# Patient Record
Sex: Female | Born: 1940
Health system: Southern US, Community
[De-identification: ages and names within clinical notes are randomized; demographics above are authoritative.]

## PROBLEM LIST (undated history)

## (undated) DIAGNOSIS — M1990 Unspecified osteoarthritis, unspecified site: Secondary | ICD-10-CM

## (undated) DIAGNOSIS — I10 Essential (primary) hypertension: Secondary | ICD-10-CM

## (undated) DIAGNOSIS — E039 Hypothyroidism, unspecified: Secondary | ICD-10-CM

## (undated) DIAGNOSIS — D649 Anemia, unspecified: Secondary | ICD-10-CM

## (undated) DIAGNOSIS — G4700 Insomnia, unspecified: Secondary | ICD-10-CM

## (undated) DIAGNOSIS — R739 Hyperglycemia, unspecified: Secondary | ICD-10-CM

## (undated) DIAGNOSIS — Z0000 Encounter for general adult medical examination without abnormal findings: Principal | ICD-10-CM

## (undated) DIAGNOSIS — R351 Nocturia: Secondary | ICD-10-CM

## (undated) DIAGNOSIS — K219 Gastro-esophageal reflux disease without esophagitis: Secondary | ICD-10-CM

## (undated) DIAGNOSIS — I4719 Other supraventricular tachycardia: Secondary | ICD-10-CM

## (undated) DIAGNOSIS — I471 Supraventricular tachycardia: Secondary | ICD-10-CM

## (undated) DIAGNOSIS — M810 Age-related osteoporosis without current pathological fracture: Secondary | ICD-10-CM

## (undated) DIAGNOSIS — I480 Paroxysmal atrial fibrillation: Secondary | ICD-10-CM

## (undated) DIAGNOSIS — I2510 Atherosclerotic heart disease of native coronary artery without angina pectoris: Secondary | ICD-10-CM

## (undated) DIAGNOSIS — T7840XA Allergy, unspecified, initial encounter: Secondary | ICD-10-CM

## (undated) DIAGNOSIS — E785 Hyperlipidemia, unspecified: Secondary | ICD-10-CM

## (undated) DIAGNOSIS — J300 Vasomotor rhinitis: Secondary | ICD-10-CM

## (undated) DIAGNOSIS — I255 Ischemic cardiomyopathy: Secondary | ICD-10-CM

## (undated) DIAGNOSIS — E669 Obesity, unspecified: Secondary | ICD-10-CM

## (undated) DIAGNOSIS — R7303 Prediabetes: Secondary | ICD-10-CM

## (undated) HISTORY — DX: Encounter for general adult medical examination without abnormal findings: Z00.00

## (undated) HISTORY — DX: Hyperlipidemia, unspecified: E78.5

## (undated) HISTORY — DX: Nocturia: R35.1

## (undated) HISTORY — PX: BREAST SURGERY: SHX581

## (undated) HISTORY — DX: Vasomotor rhinitis: J30.0

## (undated) HISTORY — PX: COLONOSCOPY: SHX174

## (undated) HISTORY — PX: INGUINAL HERNIA REPAIR: SUR1180

## (undated) HISTORY — DX: Hyperglycemia, unspecified: R73.9

## (undated) HISTORY — DX: Insomnia, unspecified: G47.00

## (undated) HISTORY — DX: Allergy, unspecified, initial encounter: T78.40XA

## (undated) HISTORY — PX: EYE SURGERY: SHX253

## (undated) HISTORY — PX: UMBILICAL HERNIA REPAIR: SHX196

## (undated) HISTORY — DX: Anemia, unspecified: D64.9

## (undated) HISTORY — DX: Obesity, unspecified: E66.9

## (undated) HISTORY — PX: CATARACT EXTRACTION: SUR2

---

## 2002-04-10 ENCOUNTER — Encounter: Admission: RE | Admit: 2002-04-10 | Discharge: 2002-04-10 | Payer: Self-pay | Admitting: Internal Medicine

## 2002-04-10 ENCOUNTER — Encounter: Payer: Self-pay | Admitting: Internal Medicine

## 2004-10-23 HISTORY — PX: TOTAL KNEE ARTHROPLASTY: SHX125

## 2004-11-28 ENCOUNTER — Inpatient Hospital Stay (HOSPITAL_COMMUNITY): Admission: RE | Admit: 2004-11-28 | Discharge: 2004-12-02 | Payer: Self-pay | Admitting: Orthopedic Surgery

## 2004-11-28 ENCOUNTER — Ambulatory Visit: Payer: Self-pay | Admitting: Physical Medicine & Rehabilitation

## 2007-05-06 ENCOUNTER — Encounter: Admission: RE | Admit: 2007-05-06 | Discharge: 2007-05-06 | Payer: Self-pay | Admitting: Internal Medicine

## 2007-05-11 ENCOUNTER — Encounter: Admission: RE | Admit: 2007-05-11 | Discharge: 2007-05-11 | Payer: Self-pay | Admitting: Internal Medicine

## 2009-10-23 HISTORY — PX: TOTAL KNEE ARTHROPLASTY: SHX125

## 2010-08-18 ENCOUNTER — Inpatient Hospital Stay (HOSPITAL_COMMUNITY): Admission: RE | Admit: 2010-08-18 | Discharge: 2010-08-20 | Payer: Self-pay | Admitting: Orthopedic Surgery

## 2011-01-04 LAB — BASIC METABOLIC PANEL
BUN: 11 mg/dL (ref 6–23)
BUN: 8 mg/dL (ref 6–23)
Calcium: 8.6 mg/dL (ref 8.4–10.5)
Chloride: 108 mEq/L (ref 96–112)
Creatinine, Ser: 0.91 mg/dL (ref 0.4–1.2)
Creatinine, Ser: 0.94 mg/dL (ref 0.4–1.2)
GFR calc Af Amer: >60 mL/min (ref >60)
GFR calc Af Amer: >60 mL/min (ref >60)
GFR calc non Af Amer: 59 mL/min — ABNORMAL LOW (ref >60)
Potassium: 4.2 mEq/L (ref 3.5–5.1)

## 2011-01-04 LAB — DIFFERENTIAL
Eosinophils Absolute: 0.1 10*3/uL (ref 0.0–0.7)
Eosinophils Relative: 3 % (ref 0–5)
Lymphocytes Relative: 25 % (ref 12–46)
Lymphs Abs: 1.1 10*3/uL (ref 0.7–4.0)
Monocytes Absolute: 0.4 10*3/uL (ref 0.1–1.0)
Monocytes Relative: 10 % (ref 3–12)

## 2011-01-04 LAB — CBC
HCT: 27.6 % — ABNORMAL LOW (ref 36.0–46.0)
Hemoglobin: 12.3 g/dL (ref 12.0–15.0)
MCH: 32 pg (ref 26.0–34.0)
MCHC: 33.8 g/dL (ref 30.0–36.0)
MCV: 93.9 fL (ref 78.0–100.0)
Platelets: 198 10*3/uL (ref 150–400)
Platelets: 209 10*3/uL (ref 150–400)
Platelets: 211 10*3/uL (ref 150–400)
RBC: 2.92 MIL/uL — ABNORMAL LOW (ref 3.87–5.11)
RBC: 3.84 MIL/uL — ABNORMAL LOW (ref 3.87–5.11)
RDW: 12.8 % (ref 11.5–15.5)
RDW: 12.9 % (ref 11.5–15.5)
WBC: 7.9 10*3/uL (ref 4.0–10.5)
WBC: 8 10*3/uL (ref 4.0–10.5)

## 2011-01-04 LAB — TYPE AND SCREEN
ABO/RH(D): A POS
Antibody Screen: NEGATIVE

## 2011-01-04 LAB — URINALYSIS, ROUTINE W REFLEX MICROSCOPIC
Bilirubin Urine: NEGATIVE
Ketones, ur: NEGATIVE mg/dL
Nitrite: NEGATIVE
Specific Gravity, Urine: 1.025 (ref 1.005–1.030)
Urobilinogen, UA: 0.2 mg/dL (ref 0.0–1.0)
pH: 5.5 (ref 5.0–8.0)

## 2011-01-04 LAB — COMPREHENSIVE METABOLIC PANEL
ALT: 20 U/L (ref 0–35)
AST: 26 U/L (ref 0–37)
Albumin: 3.8 g/dL (ref 3.5–5.2)
CO2: 27 mEq/L (ref 19–32)
Calcium: 9.3 mg/dL (ref 8.4–10.5)
Creatinine, Ser: 0.92 mg/dL (ref 0.4–1.2)
GFR calc Af Amer: >60 mL/min (ref >60)
Sodium: 141 mEq/L (ref 135–145)
Total Protein: 7.1 g/dL (ref 6.0–8.3)

## 2011-01-04 LAB — ABO/RH: ABO/RH(D): A POS

## 2011-01-04 LAB — PROTIME-INR: Prothrombin Time: 13.2 seconds (ref 11.6–15.2)

## 2011-01-04 LAB — SURGICAL PCR SCREEN: MRSA, PCR: NEGATIVE

## 2011-03-10 NOTE — Op Note (Signed)
NAME:  Wendy Munoz, Wendy Munoz NO.:  [id number]   MEDICAL RECORD NO.:  [id number]          PATIENT TYPE:  INP   LOCATION:  0003                         FACILITY:  [facility]   PHYSICIAN:  John L. Rendall III, M.D.DATE OF BIRTH:  08/20/1941   DATE OF PROCEDURE:  11/28/2004  DATE OF DISCHARGE:                                 OPERATIVE REPORT   INDICATION AND JUSTIFICATION FOR PROCEDURE:  Chronic osteoarthritis right  knee with constant pain.   JUSTIFICATION FOR IN-HOUSE STAY:  Pain control and monitoring.   PREOPERATIVE DIAGNOSIS:  End-stage osteoarthritis right knee.   SURGICAL PROCEDURE:  Right LCS total knee replacement with computer  navigation assistance.   POSTOPERATIVE DIAGNOSIS:  End-stage osteoarthritis right knee.   SURGEON:  John L. Rendall, M.D.   ASSISTANT:  Richardean Canal, P.A.-C.   PATHOLOGY:  The patient has bone-against-bone medial compartment right knee  and has had chronic unremitting pain.  There is a 14-degree flexion  contracture and 2 degrees varus at the start of the procedure from anatomic  axis.   PROCEDURE:  Under general anesthesia, the right leg was prepared with  DuraPrep and draped as a sterile field.  The sterile tourniquet was applied,  and the leg was wrapped out with the Esmarch, and the tourniquet was used to  350 mmHg.  Midline incision was made.  The patella was everted.  Multiple  small vessels were seen, and some were cauterized at the end of the case,  but the femur was sized at a standard.  Then, using insertion of Schanz pins  on the medial tibia through extra little punctures and on the medial femoral  condyle, computer navigation was initiated.  Femoral head was found first,  medial and lateral malleoli second.  The computer was then used to map the  proximal tibia and the distal femur.  Once this was done and accuracy was  checked to be within 1 mm, attention was then turned to going ahead with  osteotomies.  The proximal  tibia was resected first and was within 1 degree  of anatomical axis when completed.  The lamina spreader tensioner device was  then inserted, and the extension gap releases were then done to get the knee  within anatomic axis within a degree or a degree and a half once this was  completed.  The femoral cuts were made using the computer navigation assist.  Anterior and posterior flare of the distal femur were done first, followed  by distal femoral cut, followed by recessing guide.  Lamina spreader was  then inserted, and remnants of the menisci and cruciates were resected, and  spurs taken off the back of the femoral condyles.  Once this was completed,  the tibia was sized to a 2.5.  Center peg hole with fins was applied.  The  trial reduction of a 2.5 tibia, 12.5 bearing, and standard femur revealed  some hyperextension, and this was corrected with going to a 15 bearing.  The  patella was osteotomized, three peg holes inserted, closing the retinaculum  and capsule with towel clips.  Excellent  fit, alignment, and stability  within 2 degrees of anatomic axis were found as the knee went through  flexion and extension with trial components in place.  At this point, the  Schanz pins were removed.  Bony surfaces were prepared with pulse  irrigation, and permanent components were cemented in place.  The  tourniquet was let down at approximately 1 hour 15 minutes.  Multiple small  vessel cauterized.  Medium Hemovac inserted.  The knee was then closed in  layers with #1 Tycron, 0 and 2-0 Vicryl, and skin clips.  Operative time 1  hour 45 minutes.  The patient tolerated the procedure well and returned to  recovery in good condition.      JLR/MEDQ  D:  11/28/2004  T:  11/28/2004  Job:  [id number]

## 2011-03-10 NOTE — Discharge Summary (Signed)
NAME:  Wendy Munoz, Wendy Munoz NO.:  [id number]   MEDICAL RECORD NO.:  [id number]          PATIENT TYPE:  INP   LOCATION:  0465                         FACILITY:  [facility]   PHYSICIAN:  John L. Rendall, M.D.  DATE OF BIRTH:  [birth year]   DATE OF ADMISSION:  11/28/2004  DATE OF DISCHARGE:                                 DISCHARGE SUMMARY   ADMITTING DIAGNOSES:  1.  Bilateral osteoarthritis with right knee pain greater than left.  2.  Hypothyroidism.  3.  Allergic rhinitis.  4.  Hyperlipidemia.  5.  Rosacea.  6.  Eczema.  7.  Degenerative changes bilateral hands.  8.  Occasional gastric reflux.   DISCHARGE DIAGNOSES:  1.  Status post right total knee arthroplasty.  2.  Left knee osteoarthritis.  3.  Acute blood loss anemia secondary to surgery requiring no blood      transfusions.  4.  Slow progress with physical therapy.  5.  Hypothyroidism.  6.  Allergic rhinitis.  7.  Hyperlipidemia.  8.  Rosacea.  9.  Eczema.  10. Degenerative changes bilateral hands.  11. Occasional gastric reflux.   HISTORY OF PRESENT ILLNESS:  Wendy Munoz is a 69 year old white female with  bilateral knee pain for approximately 5 years.  Presently, right knee pain  greater than left.  The patient has received cortisone injections in  bilateral knees October 06, 2004.  This gave her total relief from pain  until November 06, 2004.  No known injury to either knee.  Right knee pain  described as moderate to severe constant, aching pain made worse with  ambulation.  No waking pain.  Mechanical symptoms positive for locking.  The  patient uses no assistive devices for ambulation.  X-rays of the right knee  showed bone-on-bone of the medial compartment.   ALLERGIES:  No known drug allergies.  SHELLFISH causes nausea and vomiting.  She also has allergies to GRASS, POLLEN, and MILDEW.   MEDICATIONS:  1.  Synthroid 0.1 mg Monday, Wednesday, Friday.  Synthroid 0.8 mg Tuesday,      Thursday,  Saturday, and Sunday.  2.  Zyrtec 10 mg one p.o. daily.  3.  Lipitor 20 mg one p.o. daily.  4.  Black cohosh 40 mg two tablets daily.  5.  Calcium p.r.n.  6.  Ibuprofen p.r.n.  7.  Tums p.r.n.  8.  MetroLotion p.r.n.  9.  Elidel p.r.n.   SURGICAL PROCEDURE:  The patient was taken to the operating room on November 28, 2004, by Dr. Erasmo Leventhal, assisted by Richardean Canal, P.A.-C.  The  patient was placed under general anesthesia and a right total knee  arthroplasty was performed.  The following components were placed:  Standard  femoral component, standard three-peg patella, 15 mm bearing, and a 2.5 keel-  tip tibial tray.  The patient tolerated the procedure well, returned to  recovery in good stable condition.   CONSULTS:  Case management, PT/OT, rehab.   HOSPITAL COURSE:  Postoperative day #1, the patient developed acute blood  loss anemia secondary to surgery.  Hemoglobin was 9.6 and  28.0.  The  patient, however, was asymptomatic.  She also had some nausea and felt  tired.  The patient's PCA therefore was to be stopped at lunch if she  tolerated lunch well and OxyContin 10 mg extended release one q.12h. was  begun.   Postoperative day #2, the patient progressing slowing with physical therapy.  Therefore, rehab consult was obtained.  H&H stable at 9.2 and 26.8.  Otherwise, the patient was afebrile, vital signs were stable.   Postoperative day #3, the patient having dizziness, dry mouth, felt was due  to pain medications.  OxyContin was therefore stopped later during the day.  The patient's H&H were 8.6 and 25.1.  The patient remained asymptomatic.  The patient was still progressing slowly with physical therapy; however, was  able to walk 40 feet with a walker at that time and therefore was not a  candidate for rehab.   Postoperative day #4, the patient's H&H 9.6 and 28.2.  The patient otherwise  with no complaints, afebrile, vital signs stable.  Skilled nursing facility  bed  was obtained and the patient was to be discharged late in the afternoon  to the [facility] in [place], [place].   LABORATORY DATA:  Routine labs on admission:  CBC with white count 4400,  hemoglobin 12.0, hematocrit 35.8, platelets 305.  At time of discharge,  hemoglobin was 9.6 and 28.2.   Routine coags on admission:  All values within normal limits.  Routine  chemistries on admission:  Glucose slightly elevated at 113; otherwise,  within normal limits.   Hepatic enzymes on admission:  All values within normal limits.  UA on  admission was negative.   Chest x-ray performed on November 23, 2004 showed no active disease.   EKG performed on November 23, 2004 showed normal sinus rhythm with occasional  PVC.  Heart rate 75 beats per minute, PR interval 136 milliseconds, PRT axis  47/43/45.   DISCHARGE INSTRUCTIONS:   MEDICATIONS:  1.  Arixtra 2.5 mg one injection subcu daily at 6 a.m. - first dose on      November 29, 2004.  The patient will undergo a total of 10 doses.      Therefore, last dose should be on December 09, 2004.  2.  Levothyroxine 100 mcg Monday, Wednesday, and Friday.  Levothyroxine 88      mcg on Tuesday, Thursday, Saturday, Sunday.  3.  Claritin 10 mg one p.o. daily.  4.  Zocor 40 mg one p.o. daily.  5.  Protonix 40 mg one p.o. daily.  6.  Colace 100 mg one p.o. b.i.d.  7.  Trinsicon one capsule p.o. three times a day.  8.  Enema of choice p.r.n.  9.  Oxycodone 5/325 mg one p.o. q.4-6h. p.r.n. pain; Tylenol not to exceed      40 00 mg daily.  10. Robaxin 500 mg one p.o. q.6h. p.r.n. spasm.  11. Restoril 15 mg p.o. q.h.s. p.r.n.  12. Reglan 10 mg tablet one p.o. q.8h. p.r.n.   DIET:  The patient is regular diet, advance as tolerated.   CONDITION ON DISCHARGE:  The patient is discharged to [facility] care in  good stable condition.   ACTIVITY:  Weightbearing as tolerated on the right leg with walker.  FOLLOW-UP:  The patient will be followed up in  the office with Dr. Priscille Kluver  approximately 14 days from surgery.  At that time, staples will be removed  and x-rays performed.   SPECIAL CARE:  CPM  0-90 degrees 6-8 hours a day and increase by 10 degrees  daily.   WOUND CARE:  The patient is to keep wound clean and dry, change dressing  daily.  Call office at [phone] if temperature is greater than 101.5, any  signs of infection, or if pain is not controlled with pain medications.      GC/MEDQ  D:  12/02/2004  T:  12/02/2004  Job:  [id number]

## 2011-03-10 NOTE — H&P (Signed)
NAME:  Wendy Munoz, KAGE NO.:  [id number]   MEDICAL RECORD NO.:  [id number]          PATIENT TYPE:  INP   LOCATION:  NA                           FACILITY:  [facility]   PHYSICIAN:  John L. Rendall, M.D.  DATE OF BIRTH:  [birth year]   DATE OF ADMISSION:  11/28/2004  DATE OF DISCHARGE:                                HISTORY & PHYSICAL   CHIEF COMPLAINT:  Bilateral knee pain, right greater than left.   HISTORY:  Ms. Wendy Munoz is a 70 year old white female with bilateral knee pain  x5 years.  Apparently, right knee pain greater than left.  The patient has  received Cortisone injections in both knees on October 06, 2004, which gave  her total relief from pain until approximately November 06, 2004.  No known  injury to either knee.  Right knee pain described as moderate to severe, a  constant ache pain, made worse with ambulation.  There is no waking pain.  Mechanical symptoms positive for locking, no assistive devices.  X-rays of  the right knee show bone-on-bone medial compartment.  The patient is to  undergo right total knee arthroplasty on November 28, 2004, at [facility] using computer navigation.   ALLERGIES:  1.  She does have an allergy to [facility].  This causes nausea and vomiting.  2.  GRASS.  3.  POLLEN.  4.  MILDEW.   MEDICATIONS:  1.  Synthroid 0.1 mcg on Monday, Wednesday, and Friday.  2.  Synthroid 0.1 mcg on Tuesday, Thursday, Saturday, and Sunday.  3.  Zyrtec 10 mg one daily.  4.  Lipitor 20 mg one p.o. daily.  5.  Black cohosh 40 mg two daily.  6.  Calcium p.r.n.  7.  Metro lotion p.r.n.  8.  Fish oil p.r.n.  9.  Elavil p.r.n.  10. Ibuprofen p.r.n.  11. Tums p.r.n.   PAST MEDICAL HISTORY:  1.  Hypothyroidism.  2.  Allergic rhinitis.  3.  Hyperlipidemia.  4.  Rosacea.  5.  Face edema.   PAST SURGICAL HISTORY:  1.  Umbilical hernia repair.  2.  Tonsillectomy.  3.  Inguinal hernia repair.  4.  Surgical biopsy of left breast.  5.   Left knee arthroscopy.  The patient denies any complications or any blood products being given with  any of the above.   PROCEDURES:  She denies any blood transfusion.   SOCIAL HISTORY:  The patient denies any tobacco or alcohol use.  She is  married.  She has one adult child.  Primary care physician is Dr. Burton Apley, phone number is [phone].  The patient lives in a one story home  with no steps to the usual entrance.  She now has a ramp at the usual  entrance.  She is currently employed in a clerical position.   FAMILY HISTORY:  Mother deceased at age 19, history of stroke, hypertension,  probable heart attack.  Father deceased at age 19, polycythaemica vera and  multiple myeloma.  She has one brother who has arthritis.  Sister who is age  24.  REVIEW OF SYSTEMS:  The patient had a recent cough/cold, however, this  resolved.  She has jaw pain, locking sensation on the left.  She wears  glasses.  Has shortness of breath with exertion, feels that this is due to  deconditioning.  She feels that she could walk a block without getting short  of breath.  No PND, orthopnea.  Occasional reflux.  Nocturia two to three  times in the night.  History of right shoulder pain.  Otherwise, review of  systems is negative.  The patient does not have a living will or power of  attorney.   PHYSICAL EXAMINATION:  GENERAL:  The patient is a well-developed, well-  nourished overweight female.  The patient walks with an antalgic gait.  The  patient's mood and affect are appropriate.  She talks easily with examiner.  VITAL SIGNS:  The patient's height is 5 feet 3 inches, weight is 215 pounds.  Temperature is 97 degrees Fahrenheit, blood pressure is 140/80, respiratory  rate is 16, pulse is 78.  HEENT:  Head is normocephalic, atraumatic without frontal or maxillary sinus  tenderness to palpation.  Sclerae is nonicteric bilaterally, conjunctivae  are pink and moist.  EOM's are intact.  PERRLA.  Nose  and nasal septum  midline.  Nasal mucosa is pink and moist without clots.  No external ear  deformities.  Left TM is occluded by cerumen, right TM pearly gray.  Buccal  mucosa is pink and moist.  Tongue and uvula are midline.  Pharynx is without  erythema or exudate.  NECK:  No lymphadenopathy.  Carotids are 2+ bilaterally without bruits.  She  has good range of motion of the cervical spine without pain.  CARDIAC:  Regular rate and rhythm, no murmurs, rubs, or gallops noted.  LUNGS:  Clear to auscultation bilaterally.  No wheezes, rhonchi, or rales  noted.  ABDOMEN:  Soft, nontender, bowel sounds x4 quadrants.  BACK:  Nontender over the thoracic and lumbar spine with palpation.  BREASTS:  Deferred at this time.  GENITOURINARY:  Deferred at this time.  RECTAL:  Deferred at this time.  NEUROLOGIC:  The patient is alert and oriented x3.  Cranial nerves II-XII  intact.  Deep tendon reflexes are 2+ at the knees and 1+ at the ankles  bilaterally.  MUSCULOSKELETAL:  Upper extremities are equal and symmetric in size and  shape.  She has full range of motion of the shoulders, elbows, and wrists.  She has degenerative changes of the hands bilaterally.  Radial pulses are 2+  bilaterally.  Lower extremities:  The patient has full range of motion of  both hips without pain.  She flexes to 90 degrees without pain.  Left knee 0  to 110 degrees of flexion.  Mild crepitance with passive range of motion.  She has pain with palpation of the medial compartment.  Valgus-varus  stressing is negative for laxity.  Anterior drawer is negative.  No  effusion, no edema.  There is a significant Baker's cyst behind the left  knee.  Right knee:  The patient lacks approximately two to three degrees in  full extension, flexes to 112 degrees.  She has moderate crepitance with  passive range of motion.  Palpation on the joint line reveals medial  compartment tenderness.  There is no edema, effusion.   Valgus-varus stressing feels unlikely.  Anterior drawer is negative.  Lower extremities  are non-edematous.  Dorsal and pedal pulse on the left is 1+, right is 3+.  The patient has good sensation to light touch in the toes.   LABORATORY DATA:  X-rays of the right knee reveal bone-on-bone of the medial  compartment.  Left knee x-rays show near bone-on-bone of the medial  compartment with approximately 1/15 inch of a separation.   IMPRESSION:  1.  Bilateral osteoarthritis with right knee greater than left.  2.  Hypothyroidism.  3.  Allergic rhinitis.  4.  Hyperlipidemia.  5.  Rosacea.  6.  Eczema.  7.  Degenerative changes of bilateral hands.  8.  Occasional gastric reflux.   PLAN:  The patient is to be admitted to [facility] on November 28, 2004, to undergo a right total knee replacement.  The patient will undergo  all preoperative labs and testing prior to surgery.  The patient did receive  a preoperative clearance from Dr. Burton Apley.      GC/MEDQ  D:  11/17/2004  T:  11/17/2004  Job:  [id number]

## 2012-01-01 IMAGING — CR DG CHEST 2V
2 series · 2 of 2 positions shown · non-contrast
Comparison: 11/23/2004

CLINICAL DATA: Preoperative respiratory exam.  Osteoarthritis of
the left knee.

CHEST - 2 VIEW

[w chest pa]
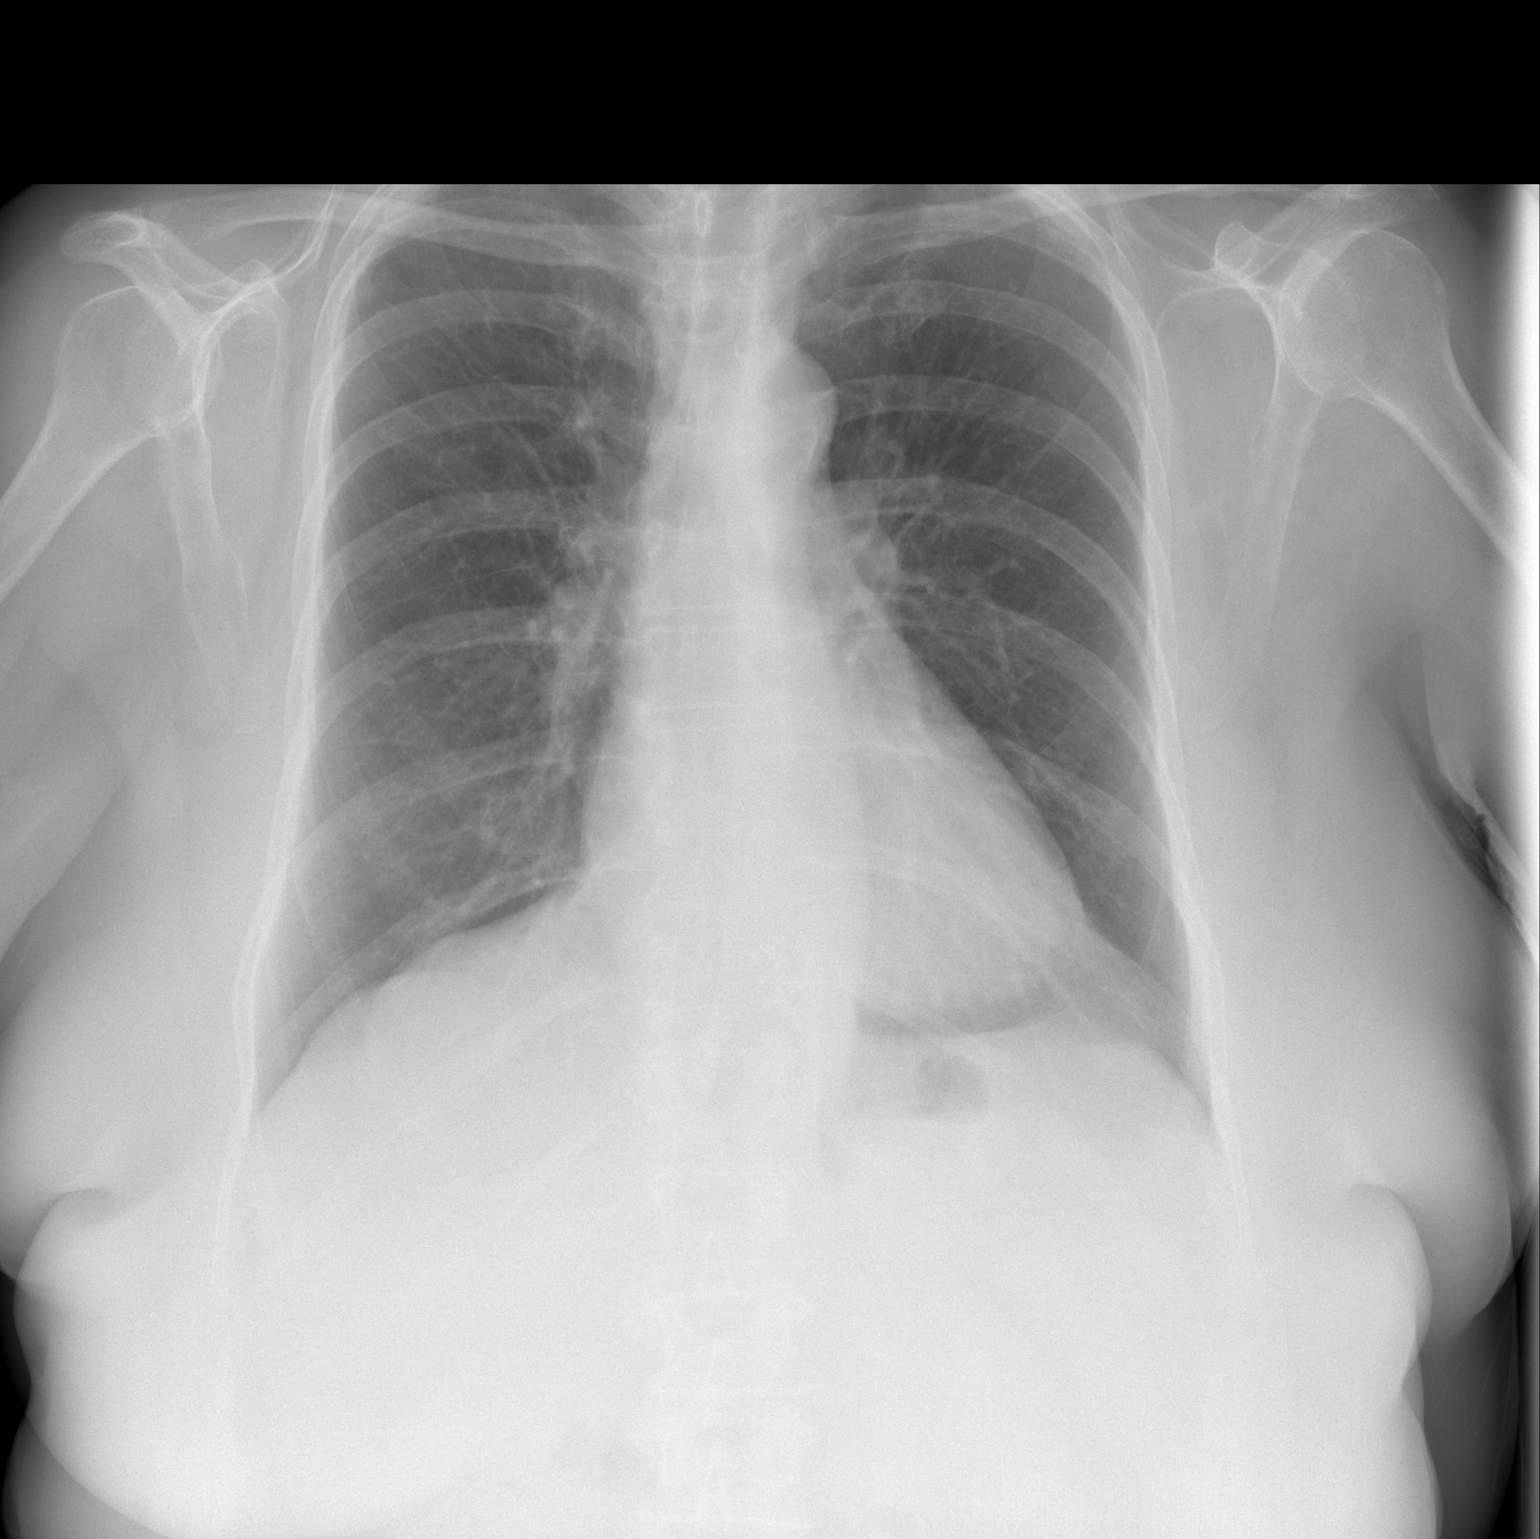

[w chest lat]
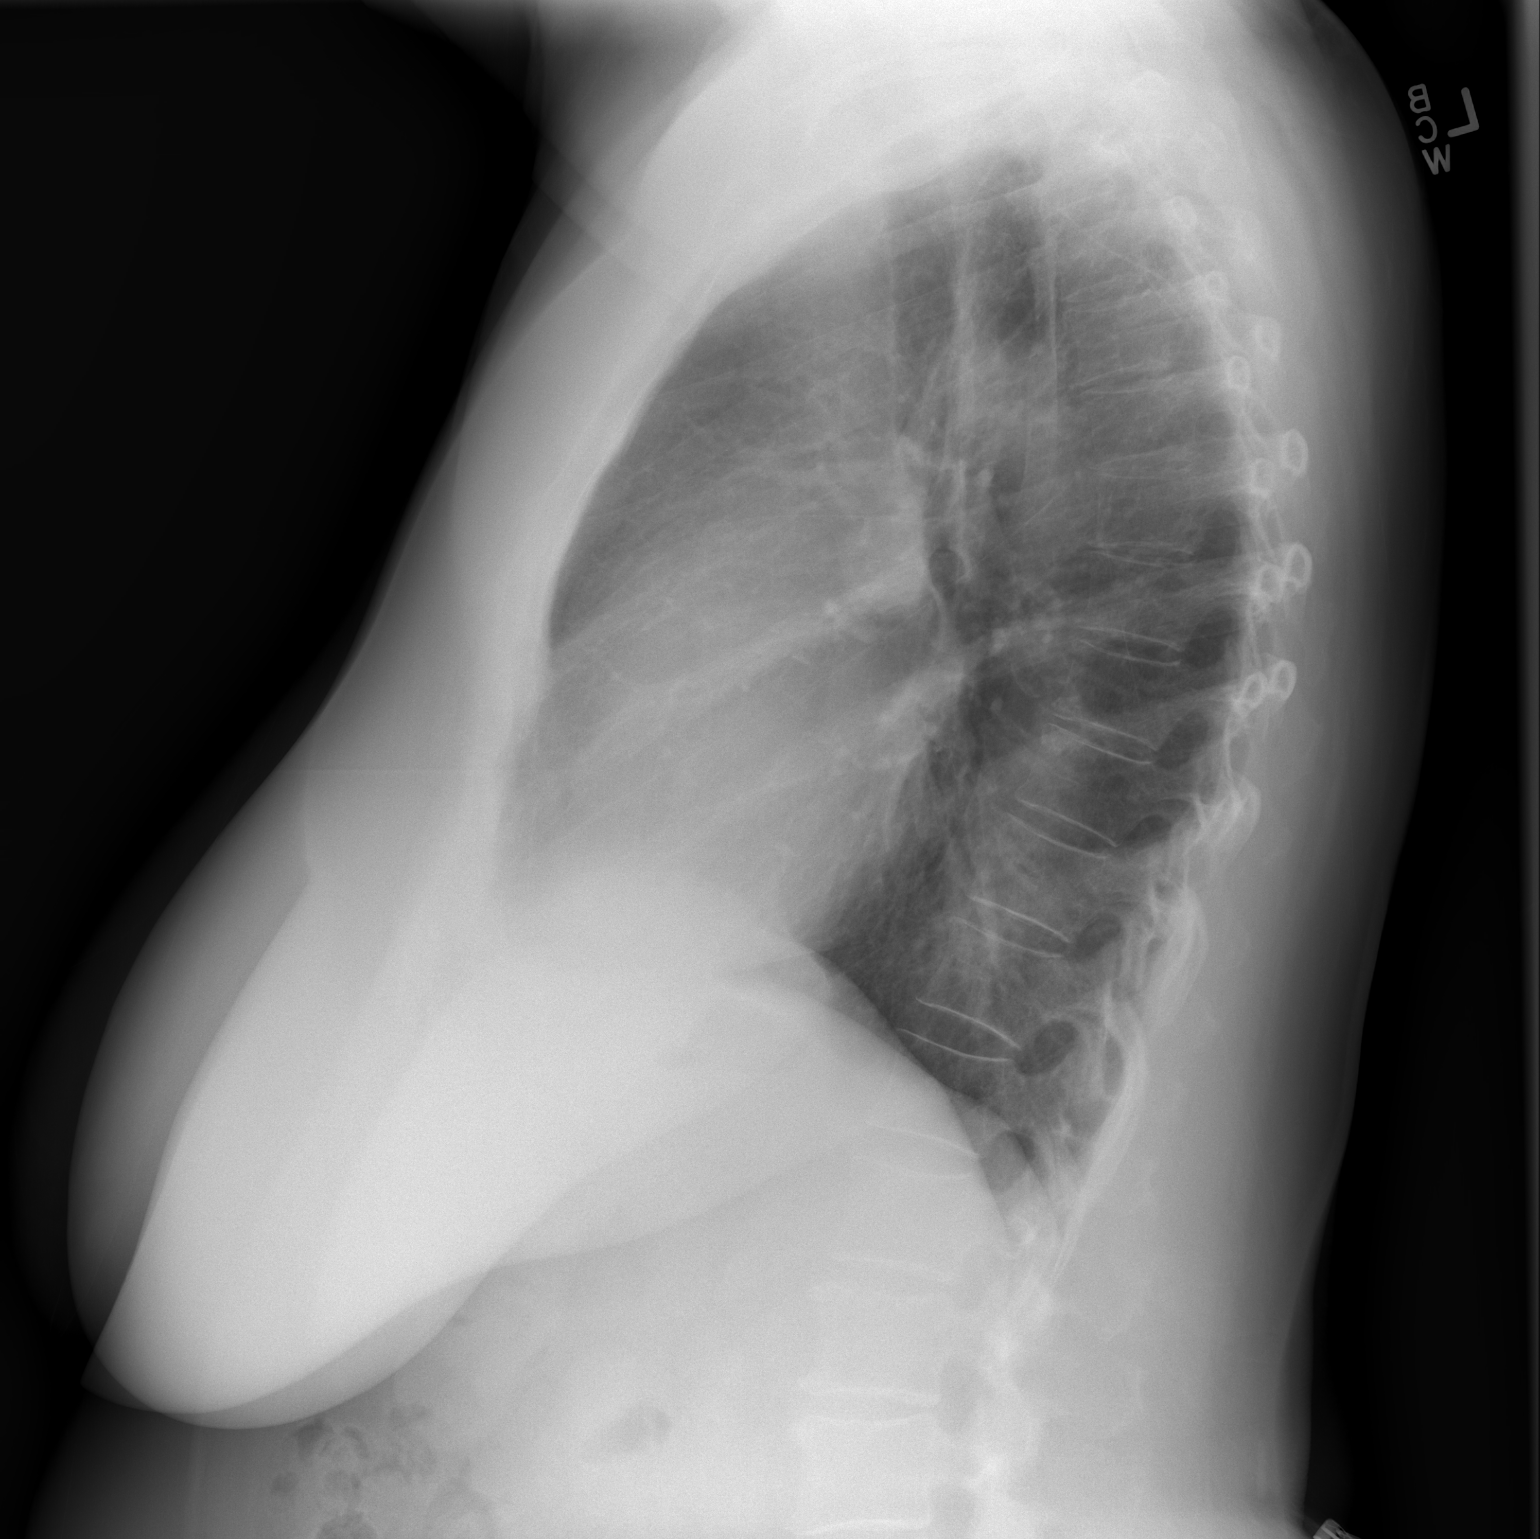

[2 of 2 positions shown; findings below may reference images not displayed]

FINDINGS: The heart size and vascularity are normal and the lungs
are clear.  No significant osseous abnormality.
IMPRESSION: Normal chest.

## 2012-03-07 LAB — HM COLONOSCOPY

## 2013-04-03 DIAGNOSIS — Z96659 Presence of unspecified artificial knee joint: Secondary | ICD-10-CM | POA: Insufficient documentation

## 2013-04-03 HISTORY — DX: Presence of unspecified artificial knee joint: Z96.659

## 2014-01-26 ENCOUNTER — Encounter (HOSPITAL_COMMUNITY): Payer: Self-pay | Admitting: Orthopedic Surgery

## 2014-01-26 ENCOUNTER — Encounter (HOSPITAL_COMMUNITY): Payer: Self-pay

## 2014-01-26 NOTE — Progress Notes (Signed)
01/26/14 1635  OBSTRUCTIVE SLEEP APNEA  Have you ever been diagnosed with sleep apnea through a sleep study? No  Do you snore loudly (loud enough to be heard through closed doors)?  0  Do you often feel tired, fatigued, or sleepy during the daytime? 1  Has anyone observed you stop breathing during your sleep? 0  Do you have, or are you being treated for high blood pressure? 1  BMI more than 35 kg/m2? 1  Age over 73 years old? 1  Gender: 0  Obstructive Sleep Apnea Score 4  Score 4 or greater  Results sent to PCP

## 2014-01-26 NOTE — H&P (Signed)
[facility] H&P  Chief Complaint: R proximal humerus fx HPI:   73 y/o RHD female s/p fall at the end of march with resultant R proximal humerus fx. Attempted non-op management but this was unsuccessful.  Pt seen in office on 01/26/2014. Agreeable to surgery. Presents for ORIF of R proximal humerus   Past Medical History  Diagnosis Date  . Hypothyroidism   . Arthritis   . Prediabetes   . HTN (hypertension)   . Osteoporosis     Past Surgical History  Procedure Laterality Date  . Inguinal hernia repair    . Umbilical hernia repair    . Total knee arthroplasty Right 2006  . Total knee arthroplasty Left 2011  . Cataract extraction Bilateral     Family History  Problem Relation Age of Onset  . Breast cancer    . CAD Mother    Social History:  reports that she has never smoked. She does not have any smokeless tobacco history on file. She reports that she does not drink alcohol. Her drug history is not on file.  Allergies:  Allergies  Allergen Reactions  . Keflex [Cephalexin] Rash   Meds PTA  ASA (81 mg), calcium/mag/vitaminD, zyrtec, cholecalciferol, norco, synthroid, lisinopril     Labs: pending (same day labs)   Review of Systems  Constitutional: Negative for fever and chills.  Respiratory: Negative for shortness of breath and wheezing.   Cardiovascular: Negative for chest pain and palpitations.  Gastrointestinal: Negative for nausea, vomiting and abdominal pain.  Musculoskeletal:       R shoulder pain   Neurological: Negative for tingling and sensory change.    Physical Exam  AF VSS in office   Physical Exam  Constitutional: She is oriented to person, place, and time. She appears well-developed and well-nourished.  HENT:  Head: Normocephalic.  Cardiovascular: Normal rate and regular rhythm.   Respiratory: Effort normal and breath sounds normal.  GI: Soft. Bowel sounds are normal.  Musculoskeletal:  Right Upper Extremity     + swelling and  ecchymosis to R UEx    Ax sensation intact    Distal R/U/M sensation intact    Weak supination     R/U/M motor grossly intact otherwise     Ext warm    + Radial pulse   Neurological: She is alert and oriented to person, place, and time.  Skin: Skin is warm.  Psychiatric: She has a normal mood and affect. Her behavior is normal.     xrays      2 part R proximal humerus fx    Assessment/Plan  73 y/o RHD female s/p fall with R proximal humerus fx, failed conservative tx  OR for ORIF R proximal humerus Admit after surgery for observation and pain control NWB/no lifting x 6-8 weeks Pt will have ROM restrictions following surgery- pendulums until 2 weeks post op, then PROM R shoulder until week 4, AAROM weeks 4-6, AROM after 6 weeks and resistance activities incorporated around the 8 week mark  Pt will not require pharmacologic dvt/pe prophylaxis as this is an upper extremity procedure and the pt will be ambulatory post op    Jari Pigg, PA-C [facility] [phone] (P)  01/26/2014, 2:35 PM

## 2014-01-27 ENCOUNTER — Encounter (HOSPITAL_COMMUNITY)
Admission: RE | Disposition: A | Discharge status: Home / Self Care | Payer: Self-pay | Source: Ambulatory Visit | Attending: Orthopedic Surgery

## 2014-01-27 ENCOUNTER — Ambulatory Visit (HOSPITAL_COMMUNITY): Payer: Medicare Other

## 2014-01-27 ENCOUNTER — Observation Stay (HOSPITAL_COMMUNITY)
Admission: RE | Admit: 2014-01-27 | Discharge: 2014-01-28 | Disposition: A | Discharge status: Home / Self Care | Payer: Medicare Other | Source: Ambulatory Visit | Attending: Orthopedic Surgery | Admitting: Orthopedic Surgery

## 2014-01-27 ENCOUNTER — Encounter (HOSPITAL_COMMUNITY): Payer: Medicare Other | Admitting: Anesthesiology

## 2014-01-27 ENCOUNTER — Encounter (HOSPITAL_COMMUNITY): Payer: Self-pay | Admitting: *Deleted

## 2014-01-27 ENCOUNTER — Ambulatory Visit (HOSPITAL_COMMUNITY): Payer: Medicare Other | Admitting: Anesthesiology

## 2014-01-27 DIAGNOSIS — S42213A Unspecified displaced fracture of surgical neck of unspecified humerus, initial encounter for closed fracture: Principal | ICD-10-CM | POA: Insufficient documentation

## 2014-01-27 DIAGNOSIS — I10 Essential (primary) hypertension: Secondary | ICD-10-CM | POA: Diagnosis present

## 2014-01-27 DIAGNOSIS — W19XXXA Unspecified fall, initial encounter: Secondary | ICD-10-CM | POA: Insufficient documentation

## 2014-01-27 DIAGNOSIS — M810 Age-related osteoporosis without current pathological fracture: Secondary | ICD-10-CM | POA: Diagnosis present

## 2014-01-27 DIAGNOSIS — K219 Gastro-esophageal reflux disease without esophagitis: Secondary | ICD-10-CM | POA: Diagnosis present

## 2014-01-27 DIAGNOSIS — Z6839 Body mass index (BMI) 39.0-39.9, adult: Secondary | ICD-10-CM | POA: Insufficient documentation

## 2014-01-27 DIAGNOSIS — R7309 Other abnormal glucose: Secondary | ICD-10-CM | POA: Insufficient documentation

## 2014-01-27 DIAGNOSIS — M129 Arthropathy, unspecified: Secondary | ICD-10-CM | POA: Insufficient documentation

## 2014-01-27 DIAGNOSIS — Z96659 Presence of unspecified artificial knee joint: Secondary | ICD-10-CM | POA: Insufficient documentation

## 2014-01-27 DIAGNOSIS — M948X9 Other specified disorders of cartilage, unspecified sites: Secondary | ICD-10-CM | POA: Insufficient documentation

## 2014-01-27 DIAGNOSIS — R739 Hyperglycemia, unspecified: Secondary | ICD-10-CM | POA: Diagnosis present

## 2014-01-27 DIAGNOSIS — Y92009 Unspecified place in unspecified non-institutional (private) residence as the place of occurrence of the external cause: Secondary | ICD-10-CM | POA: Insufficient documentation

## 2014-01-27 DIAGNOSIS — E039 Hypothyroidism, unspecified: Secondary | ICD-10-CM | POA: Diagnosis present

## 2014-01-27 DIAGNOSIS — S42209A Unspecified fracture of upper end of unspecified humerus, initial encounter for closed fracture: Secondary | ICD-10-CM | POA: Diagnosis present

## 2014-01-27 HISTORY — DX: Unspecified osteoarthritis, unspecified site: M19.90

## 2014-01-27 HISTORY — PX: ORIF HUMERUS FRACTURE: SHX2126

## 2014-01-27 HISTORY — DX: Hypothyroidism, unspecified: E03.9

## 2014-01-27 HISTORY — DX: Gastro-esophageal reflux disease without esophagitis: K21.9

## 2014-01-27 HISTORY — DX: Age-related osteoporosis without current pathological fracture: M81.0

## 2014-01-27 HISTORY — DX: Prediabetes: R73.03

## 2014-01-27 HISTORY — DX: Essential (primary) hypertension: I10

## 2014-01-27 LAB — COMPREHENSIVE METABOLIC PANEL
ALBUMIN: 3.6 g/dL (ref 3.5–5.2)
ALT: 14 U/L (ref 0–35)
AST: 20 U/L (ref 0–37)
Alkaline Phosphatase: 92 U/L (ref 39–117)
BUN: 17 mg/dL (ref 6–23)
CO2: 23 mEq/L (ref 19–32)
CREATININE: 1.02 mg/dL (ref 0.50–1.10)
Calcium: 9.1 mg/dL (ref 8.4–10.5)
Chloride: 101 mEq/L (ref 96–112)
GFR calc Af Amer: 62 mL/min — ABNORMAL LOW (ref >90)
GFR, EST NON AFRICAN AMERICAN: 54 mL/min — AB (ref >90)
Glucose, Bld: 112 mg/dL — ABNORMAL HIGH (ref 70–99)
Potassium: 4.6 mEq/L (ref 3.7–5.3)
Sodium: 140 mEq/L (ref 137–147)
TOTAL PROTEIN: 7.2 g/dL (ref 6.0–8.3)
Total Bilirubin: 0.7 mg/dL (ref 0.3–1.2)

## 2014-01-27 LAB — CBC WITH DIFFERENTIAL/PLATELET
Basophils Absolute: 0.1 10*3/uL (ref 0.0–0.1)
Basophils Relative: 1 % (ref 0–1)
EOS ABS: 0.2 10*3/uL (ref 0.0–0.7)
Eosinophils Relative: 3 % (ref 0–5)
HEMATOCRIT: 30.5 % — AB (ref 36.0–46.0)
HEMOGLOBIN: 10.2 g/dL — AB (ref 12.0–15.0)
LYMPHS ABS: 1.6 10*3/uL (ref 0.7–4.0)
Lymphocytes Relative: 26 % (ref 12–46)
MCH: 33.1 pg (ref 26.0–34.0)
MCHC: 33.4 g/dL (ref 30.0–36.0)
MCV: 99 fL (ref 78.0–100.0)
MONO ABS: 0.6 10*3/uL (ref 0.1–1.0)
MONOS PCT: 10 % (ref 3–12)
NEUTROS PCT: 60 % (ref 43–77)
Neutro Abs: 3.7 10*3/uL (ref 1.7–7.7)
Platelets: 283 10*3/uL (ref 150–400)
RBC: 3.08 MIL/uL — AB (ref 3.87–5.11)
RDW: 13.8 % (ref 11.5–15.5)
WBC: 6.1 10*3/uL (ref 4.0–10.5)

## 2014-01-27 LAB — GLUCOSE, CAPILLARY: GLUCOSE-CAPILLARY: 146 mg/dL — AB (ref 70–99)

## 2014-01-27 LAB — URINALYSIS, ROUTINE W REFLEX MICROSCOPIC
Bilirubin Urine: NEGATIVE
Glucose, UA: NEGATIVE mg/dL
Hgb urine dipstick: NEGATIVE
KETONES UR: NEGATIVE mg/dL
NITRITE: NEGATIVE
PH: 7.5 (ref 5.0–8.0)
Protein, ur: NEGATIVE mg/dL
SPECIFIC GRAVITY, URINE: 1.02 (ref 1.005–1.030)
Urobilinogen, UA: 1 mg/dL (ref 0.0–1.0)

## 2014-01-27 LAB — PROTIME-INR
INR: 1.05 (ref 0.00–1.49)
PROTHROMBIN TIME: 13.5 s (ref 11.6–15.2)

## 2014-01-27 LAB — URINE MICROSCOPIC-ADD ON

## 2014-01-27 LAB — APTT: aPTT: 24 seconds (ref 24–37)

## 2014-01-27 SURGERY — OPEN REDUCTION INTERNAL FIXATION (ORIF) PROXIMAL HUMERUS FRACTURE
Anesthesia: General | Laterality: Right

## 2014-01-27 MED ORDER — ACETAMINOPHEN 325 MG PO TABS
650.0000 mg | ORAL_TABLET | Freq: Four times a day (QID) | ORAL | Status: DC | PRN
  Administered 2014-01-28: 650 mg via ORAL

## 2014-01-27 MED ORDER — MIDAZOLAM HCL 2 MG/2ML IJ SOLN
1.0000 mg | INTRAMUSCULAR | Status: DC | PRN
  Administered 2014-01-27: 2 mg via INTRAVENOUS

## 2014-01-27 MED ORDER — HYDROMORPHONE HCL PF 1 MG/ML IJ SOLN
INTRAMUSCULAR | Status: AC
  Filled 2014-01-27: qty 1

## 2014-01-27 MED ORDER — METOCLOPRAMIDE HCL 10 MG PO TABS: 5.0000 mg | ORAL_TABLET | Freq: Three times a day (TID) | ORAL | Status: DC | PRN

## 2014-01-27 MED ORDER — PROPOFOL 10 MG/ML IV BOLUS
INTRAVENOUS | Status: AC
  Filled 2014-01-27: qty 20

## 2014-01-27 MED ORDER — MIDAZOLAM HCL 2 MG/2ML IJ SOLN
INTRAMUSCULAR | Status: AC
  Filled 2014-01-27: qty 2

## 2014-01-27 MED ORDER — ONDANSETRON HCL 4 MG/2ML IJ SOLN: 4.0000 mg | Freq: Four times a day (QID) | INTRAMUSCULAR | Status: DC | PRN

## 2014-01-27 MED ORDER — LACTATED RINGERS IV SOLN
INTRAVENOUS | Status: DC
  Administered 2014-01-27: 16:00:00 via INTRAVENOUS
  Administered 2014-01-27: 50 mL/h via INTRAVENOUS

## 2014-01-27 MED ORDER — FENTANYL CITRATE 0.05 MG/ML IJ SOLN
INTRAMUSCULAR | Status: DC | PRN
  Administered 2014-01-27: 50 ug via INTRAVENOUS

## 2014-01-27 MED ORDER — ROCURONIUM BROMIDE 100 MG/10ML IV SOLN
INTRAVENOUS | Status: DC | PRN
  Administered 2014-01-27: 50 mg via INTRAVENOUS

## 2014-01-27 MED ORDER — GLYCOPYRROLATE 0.2 MG/ML IJ SOLN
INTRAMUSCULAR | Status: DC | PRN
  Administered 2014-01-27: .8 mg via INTRAVENOUS

## 2014-01-27 MED ORDER — TRAMADOL HCL 50 MG PO TABS: 50.0000 mg | ORAL_TABLET | Freq: Four times a day (QID) | ORAL | Status: DC | PRN

## 2014-01-27 MED ORDER — PHENYLEPHRINE HCL 10 MG/ML IJ SOLN
INTRAMUSCULAR | Status: DC | PRN
  Administered 2014-01-27 (×4): 80 ug via INTRAVENOUS
  Administered 2014-01-27: 40 ug via INTRAVENOUS

## 2014-01-27 MED ORDER — FENTANYL CITRATE 0.05 MG/ML IJ SOLN
50.0000 ug | Freq: Once | INTRAMUSCULAR | Status: AC
  Administered 2014-01-27: 100 ug via INTRAVENOUS
  Filled 2014-01-27: qty 2

## 2014-01-27 MED ORDER — OXYCODONE HCL 5 MG PO TABS: 5.0000 mg | ORAL_TABLET | Freq: Once | ORAL | Status: DC | PRN

## 2014-01-27 MED ORDER — ACETAMINOPHEN 650 MG RE SUPP: 650.0000 mg | Freq: Four times a day (QID) | RECTAL | Status: DC | PRN

## 2014-01-27 MED ORDER — OXYCODONE HCL 5 MG/5ML PO SOLN: 5.0000 mg | Freq: Once | ORAL | Status: DC | PRN

## 2014-01-27 MED ORDER — SODIUM CHLORIDE 0.9 % IR SOLN
Status: DC | PRN
  Administered 2014-01-27: 1000 mL

## 2014-01-27 MED ORDER — LIDOCAINE HCL (CARDIAC) 20 MG/ML IV SOLN
INTRAVENOUS | Status: AC
  Filled 2014-01-27: qty 5

## 2014-01-27 MED ORDER — ROCURONIUM BROMIDE 50 MG/5ML IV SOLN
INTRAVENOUS | Status: AC
  Filled 2014-01-27: qty 1

## 2014-01-27 MED ORDER — ACETAMINOPHEN 500 MG PO TABS
1000.0000 mg | ORAL_TABLET | Freq: Once | ORAL | Status: AC
  Administered 2014-01-27: 1000 mg via ORAL
  Filled 2014-01-27: qty 2

## 2014-01-27 MED ORDER — PHENOL 1.4 % MT LIQD
1.0000 | OROMUCOSAL | Status: DC | PRN
  Filled 2014-01-27: qty 177

## 2014-01-27 MED ORDER — METOCLOPRAMIDE HCL 5 MG/ML IJ SOLN: 5.0000 mg | Freq: Three times a day (TID) | INTRAMUSCULAR | Status: DC | PRN

## 2014-01-27 MED ORDER — GLYCOPYRROLATE 0.2 MG/ML IJ SOLN
INTRAMUSCULAR | Status: AC
  Filled 2014-01-27: qty 4

## 2014-01-27 MED ORDER — METHOCARBAMOL 500 MG PO TABS: 500.0000 mg | ORAL_TABLET | Freq: Four times a day (QID) | ORAL | Status: DC | PRN

## 2014-01-27 MED ORDER — DOCUSATE SODIUM 100 MG PO CAPS
100.0000 mg | ORAL_CAPSULE | Freq: Two times a day (BID) | ORAL | Status: DC
  Administered 2014-01-27 – 2014-01-28 (×2): 100 mg via ORAL
  Filled 2014-01-27 (×3): qty 1

## 2014-01-27 MED ORDER — LIDOCAINE HCL (CARDIAC) 20 MG/ML IV SOLN
INTRAVENOUS | Status: DC | PRN
  Administered 2014-01-27: 100 mg via INTRAVENOUS

## 2014-01-27 MED ORDER — MIDAZOLAM HCL 5 MG/5ML IJ SOLN
INTRAMUSCULAR | Status: DC | PRN
  Administered 2014-01-27: 1 mg via INTRAVENOUS

## 2014-01-27 MED ORDER — LEVOTHYROXINE SODIUM 50 MCG PO TABS
50.0000 ug | ORAL_TABLET | Freq: Every day | ORAL | Status: DC
  Administered 2014-01-28: 50 ug via ORAL
  Filled 2014-01-27 (×2): qty 1

## 2014-01-27 MED ORDER — ALUM & MAG HYDROXIDE-SIMETH 200-200-20 MG/5ML PO SUSP: 30.0000 mL | ORAL | Status: DC | PRN

## 2014-01-27 MED ORDER — PROPOFOL 10 MG/ML IV BOLUS
INTRAVENOUS | Status: DC | PRN
  Administered 2014-01-27: 150 mg via INTRAVENOUS

## 2014-01-27 MED ORDER — FENTANYL CITRATE 0.05 MG/ML IJ SOLN
INTRAMUSCULAR | Status: AC
  Filled 2014-01-27: qty 5

## 2014-01-27 MED ORDER — ONDANSETRON HCL 4 MG/2ML IJ SOLN
INTRAMUSCULAR | Status: AC
  Filled 2014-01-27: qty 2

## 2014-01-27 MED ORDER — ONDANSETRON HCL 4 MG PO TABS: 4.0000 mg | ORAL_TABLET | Freq: Four times a day (QID) | ORAL | Status: DC | PRN

## 2014-01-27 MED ORDER — DEXTROSE 5 % IV SOLN
500.0000 mg | Freq: Four times a day (QID) | INTRAVENOUS | Status: DC | PRN
  Filled 2014-01-27: qty 5

## 2014-01-27 MED ORDER — LACTATED RINGERS IV SOLN
INTRAVENOUS | Status: DC
  Administered 2014-01-27: 21:00:00 via INTRAVENOUS

## 2014-01-27 MED ORDER — MORPHINE SULFATE 2 MG/ML IJ SOLN: 1.0000 mg | INTRAMUSCULAR | Status: DC | PRN

## 2014-01-27 MED ORDER — VANCOMYCIN HCL IN DEXTROSE 1-5 GM/200ML-% IV SOLN
1000.0000 mg | Freq: Two times a day (BID) | INTRAVENOUS | Status: AC
  Administered 2014-01-28: 1000 mg via INTRAVENOUS
  Filled 2014-01-27: qty 200

## 2014-01-27 MED ORDER — BUPIVACAINE-EPINEPHRINE PF 0.5-1:200000 % IJ SOLN
INTRAMUSCULAR | Status: DC | PRN
  Administered 2014-01-27: 25 mL

## 2014-01-27 MED ORDER — LACTATED RINGERS IV SOLN: INTRAVENOUS | Status: DC

## 2014-01-27 MED ORDER — MIDAZOLAM HCL 2 MG/2ML IJ SOLN
INTRAMUSCULAR | Status: AC
  Administered 2014-01-27: 2 mg via INTRAVENOUS
  Filled 2014-01-27: qty 2

## 2014-01-27 MED ORDER — DIPHENHYDRAMINE HCL 12.5 MG/5ML PO ELIX
12.5000 mg | ORAL_SOLUTION | ORAL | Status: DC | PRN
  Administered 2014-01-28: 25 mg via ORAL
  Filled 2014-01-27: qty 10

## 2014-01-27 MED ORDER — ONDANSETRON HCL 4 MG/2ML IJ SOLN
INTRAMUSCULAR | Status: DC | PRN
  Administered 2014-01-27: 4 mg via INTRAVENOUS

## 2014-01-27 MED ORDER — VANCOMYCIN HCL IN DEXTROSE 1-5 GM/200ML-% IV SOLN
1000.0000 mg | INTRAVENOUS | Status: AC
  Administered 2014-01-27: 1000 mg via INTRAVENOUS
  Filled 2014-01-27: qty 200

## 2014-01-27 MED ORDER — NEOSTIGMINE METHYLSULFATE 1 MG/ML IJ SOLN
INTRAMUSCULAR | Status: DC | PRN
  Administered 2014-01-27: 5 mg via INTRAVENOUS

## 2014-01-27 MED ORDER — DEXAMETHASONE SODIUM PHOSPHATE 10 MG/ML IJ SOLN
INTRAMUSCULAR | Status: DC | PRN
  Administered 2014-01-27: 4 mg

## 2014-01-27 MED ORDER — MENTHOL 3 MG MT LOZG: 1.0000 | LOZENGE | OROMUCOSAL | Status: DC | PRN

## 2014-01-27 MED ORDER — HYDROMORPHONE HCL PF 1 MG/ML IJ SOLN: 0.2500 mg | INTRAMUSCULAR | Status: DC | PRN

## 2014-01-27 SURGICAL SUPPLY — 74 items
APL SKNCLS STERI-STRIP NONHPOA (GAUZE/BANDAGES/DRESSINGS)
BANDAGE GAUZE ELAST BULKY 4 IN (GAUZE/BANDAGES/DRESSINGS) ×6 IMPLANT
BENZOIN TINCTURE PRP APPL 2/3 (GAUZE/BANDAGES/DRESSINGS) ×2 IMPLANT
BIT DRILL 2.5 SHOU (BIT) ×1 IMPLANT
BIT DRILL 2.5MM SHOU (BIT) ×1
BIT DRILL 2.7 SHOU (BIT) ×1 IMPLANT
BIT DRILL 2.7MM SHOU (BIT) ×1
BRUSH SCRUB DISP (MISCELLANEOUS) ×6 IMPLANT
COVER SURGICAL LIGHT HANDLE (MISCELLANEOUS) ×6 IMPLANT
DRAPE C-ARM 42X72 X-RAY (DRAPES) ×3 IMPLANT
DRAPE C-ARMOR (DRAPES) ×1 IMPLANT
DRAPE INCISE IOBAN 66X45 STRL (DRAPES) IMPLANT
DRAPE ORTHO SPLIT 77X108 STRL (DRAPES) ×6
DRAPE SURG 17X11 SM STRL (DRAPES) ×2 IMPLANT
DRAPE SURG ORHT 6 SPLT 77X108 (DRAPES) ×2 IMPLANT
DRAPE U-SHAPE 47X51 STRL (DRAPES) ×4 IMPLANT
DRSG ADAPTIC 3X8 NADH LF (GAUZE/BANDAGES/DRESSINGS) ×3 IMPLANT
DRSG PAD ABDOMINAL 8X10 ST (GAUZE/BANDAGES/DRESSINGS) ×3 IMPLANT
ELECT REM PT RETURN 9FT ADLT (ELECTROSURGICAL) ×3
ELECTRODE REM PT RTRN 9FT ADLT (ELECTROSURGICAL) ×1 IMPLANT
EVACUATOR 1/8 PVC DRAIN (DRAIN) IMPLANT
GLOVE BIO SURGEON STRL SZ7.5 (GLOVE) ×3 IMPLANT
GLOVE BIO SURGEON STRL SZ8 (GLOVE) ×3 IMPLANT
GLOVE BIOGEL PI IND STRL 7.5 (GLOVE) ×1 IMPLANT
GLOVE BIOGEL PI IND STRL 8 (GLOVE) ×1 IMPLANT
GLOVE BIOGEL PI INDICATOR 7.5 (GLOVE) ×2
GLOVE BIOGEL PI INDICATOR 8 (GLOVE) ×2
GOWN STRL REUS W/ TWL LRG LVL3 (GOWN DISPOSABLE) ×2 IMPLANT
GOWN STRL REUS W/ TWL XL LVL3 (GOWN DISPOSABLE) ×1 IMPLANT
GOWN STRL REUS W/TWL 2XL LVL3 (GOWN DISPOSABLE) ×1 IMPLANT
GOWN STRL REUS W/TWL LRG LVL3 (GOWN DISPOSABLE) ×3
GOWN STRL REUS W/TWL XL LVL3 (GOWN DISPOSABLE) ×3
KIT BASIN OR (CUSTOM PROCEDURE TRAY) ×3 IMPLANT
KIT ROOM TURNOVER OR (KITS) ×3 IMPLANT
LOOP VESSEL MAXI BLUE (MISCELLANEOUS) IMPLANT
MANIFOLD NEPTUNE II (INSTRUMENTS) ×3 IMPLANT
NDL 1/2 CIR CATGUT .05X1.09 (NEEDLE) IMPLANT
NEEDLE 1/2 CIR CATGUT .05X1.09 (NEEDLE) ×3 IMPLANT
NS IRRIG 1000ML POUR BTL (IV SOLUTION) ×3 IMPLANT
PACK TOTAL JOINT (CUSTOM PROCEDURE TRAY) ×3 IMPLANT
PAD ARMBOARD 7.5X6 YLW CONV (MISCELLANEOUS) ×6 IMPLANT
PLATE HUMERAL PROX RT 2H (Plate) ×2 IMPLANT
RETRIEVER SUT HEWSON (MISCELLANEOUS) ×2 IMPLANT
SCREW LOCKING 3.5X24 SHOU (Screw) ×2 IMPLANT
SCREW LOCKING 3.5X34 SHOU (Screw) ×2 IMPLANT
SCREW LOCKING 3.5X40 SHOU (Screw) ×2 IMPLANT
SCREW LOCKING 3.5X44 SHOU (Screw) ×2 IMPLANT
SCREW NONLOCK 3.5X16MM (Screw) ×2 IMPLANT
SCREW NONLOCK 3.5X18MM (Screw) ×2 IMPLANT
SCREW NONLOCK 3.5X20MM (Screw) ×2 IMPLANT
SCREW NONLOCK 3.5X22MM (Screw) ×2 IMPLANT
SCREW NONLOCK 3.5X26MM (Screw) ×2 IMPLANT
SCREW NONLOCK 3.5X30MM (Screw) ×2 IMPLANT
SCREW NONLOCK 3.5X32MM (Screw) ×4 IMPLANT
SPONGE GAUZE 4X4 12PLY (GAUZE/BANDAGES/DRESSINGS) ×6 IMPLANT
SPONGE LAP 18X18 X RAY DECT (DISPOSABLE) IMPLANT
STAPLER VISISTAT 35W (STAPLE) ×3 IMPLANT
STOCKINETTE IMPERVIOUS LG (DRAPES) ×1 IMPLANT
SUCTION FRAZIER TIP 10 FR DISP (SUCTIONS) ×3 IMPLANT
SUT ETHIBOND 5 LR DA (SUTURE) ×1 IMPLANT
SUT FIBERWIRE #2 38 T-5 BLUE (SUTURE) ×9
SUT PDS AB 2-0 CT1 27 (SUTURE) IMPLANT
SUT VIC AB 0 CT1 27 (SUTURE) ×6
SUT VIC AB 0 CT1 27XBRD ANBCTR (SUTURE) ×2 IMPLANT
SUT VIC AB 2-0 CT1 27 (SUTURE) ×6
SUT VIC AB 2-0 CT1 TAPERPNT 27 (SUTURE) ×2 IMPLANT
SUT VIC AB 2-0 CT3 27 (SUTURE) IMPLANT
SUTURE FIBERWR #2 38 T-5 BLUE (SUTURE) IMPLANT
SYR 5ML LL (SYRINGE) IMPLANT
TOWEL OR 17X24 6PK STRL BLUE (TOWEL DISPOSABLE) ×3 IMPLANT
TOWEL OR 17X26 10 PK STRL BLUE (TOWEL DISPOSABLE) ×6 IMPLANT
TRAY FOLEY CATH 16FRSI W/METER (SET/KITS/TRAYS/PACK) IMPLANT
WATER STERILE IRR 1000ML POUR (IV SOLUTION) ×1 IMPLANT
YANKAUER SUCT BULB TIP NO VENT (SUCTIONS) IMPLANT

## 2014-01-27 NOTE — Anesthesia Preprocedure Evaluation (Addendum)
Anesthesia Evaluation  Patient identified by MRN, date of birth, ID band Patient awake    Reviewed: Allergy & Precautions, H&P , NPO status , Patient's Chart, lab work & pertinent test results  Airway Mallampati: I TM Distance: >3 FB     Dental   Pulmonary  breath sounds clear to auscultation        Cardiovascular hypertension, Rhythm:Regular Rate:Normal     Neuro/Psych    GI/Hepatic GERD-  ,  Endo/Other  Hypothyroidism Morbid obesity  Renal/GU      Musculoskeletal   Abdominal   Peds  Hematology   Anesthesia Other Findings   Reproductive/Obstetrics                         Anesthesia Physical Anesthesia Plan  ASA: III  Anesthesia Plan: General   Post-op Pain Management:    Induction: Intravenous  Airway Management Planned: Oral ETT  Additional Equipment:   Intra-op Plan:   Post-operative Plan: Extubation in OR  Informed Consent: I have reviewed the patients History and Physical, chart, labs and discussed the procedure including the risks, benefits and alternatives for the proposed anesthesia with the patient or authorized representative who has indicated his/her understanding and acceptance.     Plan Discussed with: CRNA and Surgeon  Anesthesia Plan Comments:        Anesthesia Quick Evaluation

## 2014-01-27 NOTE — Anesthesia Postprocedure Evaluation (Signed)
  Anesthesia Post-op Note  Patient: Wendy Munoz  Procedure(s) Performed: Procedure(s): RIGHT OPEN REDUCTION INTERNAL FIXATION (ORIF) PROXIMAL HUMERUS FRACTURE (Right)  Patient Location: PACU  Anesthesia Type:GA combined with regional for post-op pain  Level of Consciousness: awake  Airway and Oxygen Therapy: Patient Spontanous Breathing  Post-op Pain: none  Post-op Assessment: Post-op Vital signs reviewed, Patient's Cardiovascular Status Stable, Respiratory Function Stable, Patent Airway, No signs of Nausea or vomiting and Pain level controlled  Post-op Vital Signs: Reviewed and stable  Complications: No apparent anesthesia complications

## 2014-01-27 NOTE — H&P (Signed)
I saw and examined the patient with Mr. Eddie Dibbles, communicating the findings and plan noted above.  I discussed with the patient the risks and benefits of surgery, including the possibility of infection, nerve injury, vessel injury, wound breakdown, arthritis, symptomatic hardware, DVT/ PE, loss of motion, heart attack, stroke, and need for further surgery among others.  She understood these risks and wished to proceed.  Altamese St. Marks, MD [facility] [phone] (p)

## 2014-01-27 NOTE — Transfer of Care (Signed)
Immediate Anesthesia Transfer of Care Note  Patient: Wendy Munoz  Procedure(s) Performed: Procedure(s): RIGHT OPEN REDUCTION INTERNAL FIXATION (ORIF) PROXIMAL HUMERUS FRACTURE (Right)  Patient Location: PACU  Anesthesia Type:General and GA combined with regional for post-op pain  Level of Consciousness: awake and alert   Airway & Oxygen Therapy: Patient Spontanous Breathing and Patient connected to nasal cannula oxygen  Post-op Assessment: Report given to PACU RN and Post -op Vital signs reviewed and stable  Post vital signs: Reviewed and stable  Complications: No apparent anesthesia complications

## 2014-01-27 NOTE — Anesthesia Procedure Notes (Addendum)
Anesthesia Regional Block:  Interscalene brachial plexus block  Pre-Anesthetic Checklist: ,, timeout performed, Correct Patient, Correct Site, Correct Laterality, Correct Procedure, Correct Position, site marked, Risks and benefits discussed,  Surgical consent,  Pre-op evaluation,  At surgeon's request and post-op pain management  Laterality: Right  Prep: chloraprep       Needles:  Injection technique: Single-shot  Needle Type: Echogenic Stimulator Needle     Needle Length: 5cm 5 cm Needle Gauge: 22 and 22 G    Additional Needles:  Procedures: ultrasound guided (picture in chart) and nerve stimulator Interscalene brachial plexus block  Nerve Stimulator or Paresthesia:  Response: 0.48 mA,   Additional Responses:   Narrative:  Start time: 01/27/2014 12:50 PM End time: 01/27/2014 1:02 PM Injection made incrementally with aspirations every 3 mL. Anesthesiologist: Dr Chriss Driver  Additional Notes: 1250-1302 R ISB POP CHG prep, sterile tech Single stick w/out paresthesias Good [place] visualization-PIX in chart-and stim down to .70ma Multiple neg asp Marc .5% w/epi 1:200000 total 25cc+decadron 4mg  infiltrated No compl Dr Chriss Driver   Procedure Name: Intubation Date/Time: 01/27/2014 2:23 PM Performed by: Julian Reil Pre-anesthesia Checklist: Patient identified, Emergency Drugs available, Suction available and Patient being monitored Patient Re-evaluated:Patient Re-evaluated prior to inductionOxygen Delivery Method: Circle system utilized Preoxygenation: Pre-oxygenation with 100% oxygen Intubation Type: IV induction Ventilation: Mask ventilation without difficulty Laryngoscope Size: Mac and 3 Grade View: Grade II Tube type: Oral Tube size: 7.0 mm Number of attempts: 1 Placement Confirmation: ETT inserted through vocal cords under direct vision,  positive ETCO2 and breath sounds checked- equal and bilateral Secured at: 22 cm Tube secured with: Tape Dental Injury: Teeth and  Oropharynx as per pre-operative assessment

## 2014-01-27 NOTE — Brief Op Note (Signed)
01/27/2014  7:54 PM  PATIENT:  Wendy Munoz  73 y.o. female  PRE-OPERATIVE DIAGNOSIS:  RIGHT PROXIMAL HUMERUS FRACTURE  POST-OPERATIVE DIAGNOSIS:  RIGHT PROXIMAL HUMERUS FRACTURE  PROCEDURE:  Procedure(s): RIGHT OPEN REDUCTION INTERNAL FIXATION (ORIF) PROXIMAL HUMERUS FRACTURE (Right) 4 part  SURGEON:  Surgeon(s) and Role:    * Rozanna Box, MD - Primary  PHYSICIAN ASSISTANT: Ainsley Spinner, PA-C  ANESTHESIA:   regional and general  I/O:     SPECIMEN:  No Specimen  TOURNIQUET:  * No tourniquets in log *  DICTATION: .Other Dictation: Dictation Number [id number]

## 2014-01-28 DIAGNOSIS — E039 Hypothyroidism, unspecified: Secondary | ICD-10-CM | POA: Diagnosis present

## 2014-01-28 DIAGNOSIS — I10 Essential (primary) hypertension: Secondary | ICD-10-CM | POA: Diagnosis present

## 2014-01-28 DIAGNOSIS — R739 Hyperglycemia, unspecified: Secondary | ICD-10-CM | POA: Diagnosis present

## 2014-01-28 DIAGNOSIS — K219 Gastro-esophageal reflux disease without esophagitis: Secondary | ICD-10-CM | POA: Diagnosis present

## 2014-01-28 DIAGNOSIS — M810 Age-related osteoporosis without current pathological fracture: Secondary | ICD-10-CM | POA: Diagnosis present

## 2014-01-28 LAB — VITAMIN D 25 HYDROXY (VIT D DEFICIENCY, FRACTURES): VIT D 25 HYDROXY: 36 ng/mL (ref 30–89)

## 2014-01-28 MED ORDER — TRAMADOL HCL 50 MG PO TABS: 50.0000 mg | ORAL_TABLET | Freq: Four times a day (QID) | ORAL | Status: DC | PRN

## 2014-01-28 MED ORDER — DSS 100 MG PO CAPS: 100.0000 mg | ORAL_CAPSULE | Freq: Two times a day (BID) | ORAL | Status: DC

## 2014-01-28 MED ORDER — ACETAMINOPHEN 500 MG PO TABS: 500.0000 mg | ORAL_TABLET | Freq: Four times a day (QID) | ORAL | Status: DC | PRN

## 2014-01-28 NOTE — Discharge Instructions (Addendum)
[facility] Discharge Instructions   General Discharge Instructions  WEIGHT BEARING STATUS: Nonweight bearing R arm  RANGE OF MOTION/ACTIVITY:  R shoulder pendulums only, sling on at all times except when sitting in chair or lying in bed. No lifting with R arm   Pt will be NWB on R arm for 6-8 weeks Gentle pendulums and PROM of R shoulder only for the next 2 weeks or so. NO AGGRESSIVE IR or ER of R shoulder. NO ACTIVE motion to R shoulder AROM R elbow, forearm, wrist and hand   Wound care: daily dressing changes starting in 4-5 days, see below   Diet: as you were eating previously.  Can use over the counter stool softeners and bowel preparations, such as Miralax, to help with bowel movements.  Narcotics can be constipating.  Be sure to drink plenty of fluids  STOP SMOKING OR USING NICOTINE PRODUCTS!!!!  As discussed nicotine severely impairs your body's ability to heal surgical and traumatic wounds but also impairs bone healing.  Wounds and bone heal by forming microscopic blood vessels (angiogenesis) and nicotine is a vasoconstrictor (essentially, shrinks blood vessels).  Therefore, if vasoconstriction occurs to these microscopic blood vessels they essentially disappear and are unable to deliver necessary nutrients to the healing tissue.  This is one modifiable factor that you can do to dramatically increase your chances of healing your injury.    (This means no smoking, no nicotine gum, patches, etc)  DO NOT USE NONSTEROIDAL ANTI-INFLAMMATORY DRUGS (NSAID'S)  Using products such as Advil (ibuprofen), Aleve (naproxen), Motrin (ibuprofen) for additional pain control during fracture healing can delay and/or prevent the healing response.  If you would like to take over the counter (OTC) medication, Tylenol (acetaminophen) is ok.  However, some narcotic medications that are given for pain control contain acetaminophen as well. Therefore, you should not exceed more than 4000 mg of  tylenol in a day if you do not have liver disease.  Also note that there are may OTC medicines, such as cold medicines and allergy medicines that my contain tylenol as well.  If you have any questions about medications and/or interactions please ask your doctor/PA or your pharmacist.   PAIN MEDICATION USE AND EXPECTATIONS  You have likely been given narcotic medications to help control your pain.  After a traumatic event that results in an fracture (broken bone) with or without surgery, it is ok to use narcotic pain medications to help control one's pain.  We understand that everyone responds to pain differently and each individual patient will be evaluated on a regular basis for the continued need for narcotic medications. Ideally, narcotic medication use should last no more than 6-8 weeks (coinciding with fracture healing).   As a patient it is your responsibility as well to monitor narcotic medication use and report the amount and frequency you use these medications when you come to your office visit.   We would also advise that if you are using narcotic medications, you should take a dose prior to therapy to maximize you participation.  IF YOU ARE ON NARCOTIC MEDICATIONS IT IS NOT PERMISSIBLE TO OPERATE A MOTOR VEHICLE (MOTORCYCLE/CAR/TRUCK/MOPED) OR HEAVY MACHINERY DO NOT MIX NARCOTICS WITH OTHER CNS (CENTRAL NERVOUS SYSTEM) DEPRESSANTS SUCH AS ALCOHOL       ICE AND ELEVATE INJURED/OPERATIVE EXTREMITY  Using ice and elevating the injured extremity above your heart can help with swelling and pain control.  Icing in a pulsatile fashion, such as 20 minutes on and 20 minutes  off, can be followed.    Do not place ice directly on skin. Make sure there is a barrier between to skin and the ice pack.    Using frozen items such as frozen peas works well as the conform nicely to the are that needs to be iced.  USE AN ACE WRAP OR TED HOSE FOR SWELLING CONTROL  In addition to icing and elevation, Ace  wraps or TED hose are used to help limit and resolve swelling.  It is recommended to use Ace wraps or TED hose until you are informed to stop.    When using Ace Wraps start the wrapping distally (farthest away from the body) and wrap proximally (closer to the body)   Example: If you had surgery on your leg or thing and you do not have a splint on, start the ace wrap at the toes and work your way up to the thigh        If you had surgery on your upper extremity and do not have a splint on, start the ace wrap at your fingers and work your way up to the upper arm  IF YOU ARE IN A SPLINT OR CAST DO NOT [place]   If your splint gets wet for any reason please contact the office immediately. You may shower in your splint or cast as long as you keep it dry.  This can be done by wrapping in a cast cover or garbage back (or similar)  Do Not stick any thing down your splint or cast such as pencils, money, or hangers to try and scratch yourself with.  If you feel itchy take benadryl as prescribed on the bottle for itching  IF YOU ARE IN A CAM BOOT (BLACK BOOT)  You may remove boot periodically. Perform daily dressing changes as noted below.  Wash the liner of the boot regularly and wear a sock when wearing the boot. It is recommended that you sleep in the boot until told otherwise  CALL THE OFFICE WITH ANY QUESTIONS OR CONCERTS: [phone]     Discharge Pin Site Instructions  Dress pins daily with Kerlix roll starting on POD 2. Wrap the Kerlix so that it tamps the skin down around the pin-skin interface to prevent/limit motion of the skin relative to the pin.  (Pin-skin motion is the primary cause of pain and infection related to external fixator pin sites).  Remove any crust or coagulum that may obstruct drainage with a saline moistened gauze or soap and water.  After POD 3, if there is no discernable drainage on the pin site dressing, the interval for change can by increased to every  other day.  You may shower with the fixator, cleaning all pin sites gently with soap and water.  If you have a surgical wound this needs to be completely dry and without drainage before showering.  The extremity can be lifted by the fixator to facilitate wound care and transfers.  Notify the office/Doctor if you experience increasing drainage, redness, or pain from a pin site, or if you notice purulent (thick, snot-like) drainage.  Discharge Wound Care Instructions  Do NOT apply any ointments, solutions or lotions to pin sites or surgical wounds.  These prevent needed drainage and even though solutions like hydrogen peroxide kill bacteria, they also damage cells lining the pin sites that help fight infection.  Applying lotions or ointments can keep the wounds moist and can cause them to breakdown and open up  as well. This can increase the risk for infection. When in doubt call the office.  Surgical incisions should be dressed daily.  If any drainage is noted, use one layer of adaptic, then gauze, Kerlix, and an ace wrap.  Once the incision is completely dry and without drainage, it may be left open to air out.  Showering may begin 36-48 hours later.  Cleaning gently with soap and water.  Traumatic wounds should be dressed daily as well.    One layer of adaptic, gauze, Kerlix, then ace wrap.  The adaptic can be discontinued once the draining has ceased    If you have a wet to dry dressing: wet the gauze with saline the squeeze as much saline out so the gauze is moist (not soaking wet), place moistened gauze over wound, then place a dry gauze over the moist one, followed by Kerlix wrap, then ace wrap.

## 2014-01-28 NOTE — Progress Notes (Signed)
[facility] Progress Note  Subjective  Doing well Sitting in bedside chair States she is not having pain Has not taken anything for pain other then 2 ES tylenol that was given to her yesterday pre-op Ready to go home    Objective   BP 120/55  Pulse 95  Temp(Src) 98.6 F (37 C) (Oral)  Resp 16  Ht 5\' 3"  (1.6 m)  Wt 100.699 kg (222 lb)  BMI 39.34 kg/m2  SpO2 93%  Intake/Output     04/07 0701 - 04/08 0700 04/08 0701 - 04/09 0700   P.O.  120   I.V. (mL/kg) 1976.7 (19.6) 75 (0.7)   Total Intake(mL/kg) 1976.7 (19.6) 195 (1.9)   Blood 150    Total Output 150     Net +1826.7 +195        Urine Occurrence 2 x      Labs  No new labs  Exam  Gen: awake and alert, NAD, comfortable Lungs: clear, unlabored Cardiac: s1 and s2 Abd: + BS, NT Ext:      Left Upper Extremity   Dressing c/d/i  R/U/M/ax sensation grossly intact  Distal motor function intact  Ext warm   + radial pulse  Swelling well controlled   Assessment and Plan   POD/HD#: 1  73 y/o female s/p fall with 4-part R proximal humerus fx s/p ORIF  1. Fall  2. R proximal humerus fx s/p ORIF  NWB R arm  Pendulums R shoulder only x 2 weeks then we will progress accordingly   Pt will be NWB on R arm for 6-8 weeks   Gentle pendulums and PROM of R shoulder only for the next 2 weeks or so. NO AGGRESSIVE IR   or   ER of R shoulder. NO ACTIVE motion to R shoulder   AROM R elbow, forearm, wrist and hand  Ice prn  Dressing change PRN  Sling when up and moving  Ok to remove when seated or lying down  3. Medical issues  Resume home meds  4. Metabolic bone disease   Vitamin D levels low normal  Continue with home supplementation  Will need DEXA as out pt   5. DVT/PE prophylaxis  Will not require pharmacologic DVT prophylaxis  6. FEN  Diet as tolerated  7. Pain control  Extra-strength tylenol 1-2 q 6h prn pain  Will also write for Ultram 50-100 mg po q6h prn pain between tylenol  8.  Dispo  Dc home today after OT eval   Follow up with ortho in 10-14 days    Jari Pigg, PA-C [facility] [phone] (P) 01/28/2014 9:05 AM

## 2014-01-28 NOTE — Progress Notes (Signed)
Occupational Therapy Evaluation and Discharge Patient Details Name: Wendy Munoz MRN: [id number] DOB: 09-25-1941 Today's Date: 01/28/2014    History of Present Illness Pt is 73 y.o Female s/p open reduction internal fixation of proximal humerus fracture following a fall in march off her ramped entrance.    Clinical Impression   PTA pt lived with husband. Since fall in March, pt has required assistance from husband to don/doff socks. Education and training provided regarding incorporating shoulder precautions into ADLs. Trained pt in compensatory technique for UB dressing and bathing. Pt performed pendulum exercises and was educated and trained in IT trainer. 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  Pt reports her husband will be home to assist 24/7 with ADLs. No further acute OT needs.    Follow Up Recommendations  No OT follow up;Supervision/Assistance - 24 hour    Equipment Recommendations  None recommended by OT       Precautions / Restrictions Precautions Precautions: Shoulder Type of Shoulder Precautions: No AROM of R shoulder; pendulums only Shoulder Interventions: Shoulder sling/immobilizer;At all times;Off for dressing/bathing/exercises Precaution Booklet Issued: Yes (comment) Precaution Comments: Educated pt on shoulder precautions and incorporating into ADLs Required Braces or Orthoses: Sling Restrictions Weight Bearing Restrictions: Yes RUE Weight Bearing: Non weight bearing      Mobility                  Transfers Overall transfer level: Needs assistance Equipment used: None Transfers: Sit to/from Stand Sit to Stand: Supervision         General transfer comment: Pt with good standing and sitting balance.    Balance Overall balance assessment: No apparent balance deficits (not formally  assessed)                                          ADL Overall ADL's : Needs assistance/impaired Eating/Feeding: Set up;Sitting   Grooming: Modified independent;Sitting (with non-dominant hand)   Upper Body Bathing: Minimal assitance;Sitting   Lower Body Bathing: Sit to/from stand;Set up   Upper Body Dressing : Moderate assistance;Sitting;Adhering to UE precautions   Lower Body Dressing: Minimal assistance;Sit to/from stand   Toilet Transfer: Supervision/safety;Ambulation (sit<>stand from recliner)   Toileting- Clothing Manipulation and Hygiene: Minimal assistance;Sit to/from stand (with non-dominant hand)   Tub/ Shower Transfer: Supervision/safety;Ambulation;Shower seat   Functional mobility during ADLs: Supervision/safety General ADL Comments: Pt required A from husband for socks. Now min/mod assist for UB ADLs. Pt with good sitting balance for LB ADLs.                Pertinent Vitals/Pain Pt denies pain. Provided ice and elevation for management of R hand edema.     Hand Dominance Right   Extremity/Trunk Assessment Upper Extremity Assessment Upper Extremity Assessment: RUE deficits/detail RUE Deficits / Details: R shoulder precautions; no AROM, pendulums only RUE: Unable to fully assess due to immobilization RUE Coordination: decreased fine motor;decreased gross motor   Lower Extremity Assessment Lower Extremity Assessment: Overall WFL for tasks assessed   Cervical / Trunk Assessment Cervical / Trunk Assessment: Normal   Communication Communication Communication: No difficulties   Cognition Arousal/Alertness: Awake/alert Behavior During Therapy: WFL for tasks assessed/performed Overall Cognitive Status: Within Functional Limits for tasks assessed  Exercises Exercises: Shoulder     Shoulder Instructions Shoulder Instructions Donning/doffing shirt without moving shoulder: Supervision/safety Method for  sponge bathing under operated UE: Supervision/safety Donning/doffing sling/immobilizer: Minimal assistance Correct positioning of sling/immobilizer: Set-up Pendulum exercises (written home exercise program): Supervision/safety Sling wearing schedule (on at all times/off for ADL's): Supervision/safety Proper positioning of operated UE when showering: Supervision/safety    Home Living Family/patient expects to be discharged to:: Private residence Living Arrangements: Spouse/significant other Available Help at Discharge: Family;Available 24 hours/day Type of Home: House Home Access: Ramped entrance     Home Layout: One level     Bathroom Shower/Tub: Tub/shower unit;Curtain Shower/tub characteristics: Architectural technologist: Standard (toilet riser)     Home Equipment: Bedside commode;Shower seat;Toilet riser;Other (comment) (sock aid)          Prior Functioning/Environment Level of Independence: Needs assistance    ADL's / Homemaking Assistance Needed: Husband has been assisting with donning/doffing socks since her fall in March                                  End of Session Equipment Utilized During Treatment: Other (comment) (sling) Nurse Communication: Other (comment) (pt ready for D/C from OT standpoint)  Activity Tolerance: Patient tolerated treatment well Patient left: in chair;with call bell/phone within reach   Time: 0902-0947 OT Time Calculation (min): 45 min Charges:  OT General Charges $OT Visit: 1 Procedure OT Evaluation $Initial OT Evaluation Tier I: 1 Procedure OT Treatments $Self Care/Home Management : 23-37 mins $Therapeutic Exercise: 8-22 mins G-Codes: OT G-codes **NOT FOR INPATIENT CLASS** Functional Assessment Tool Used: clinical observation Functional Limitation: Self care Self Care Current Status (I3382): At least 1 percent but less than 20 percent impaired, limited or restricted Self Care Goal Status (N0539): At least 1 percent but  less than 20 percent impaired, limited or restricted Self Care Discharge Status [phone]): At least 1 percent but less than 20 percent impaired, limited or restricted   Wendy Munoz [phone] 01/28/2014, 10:09 AM

## 2014-01-28 NOTE — Op Note (Signed)
NAMESHTERNA, LARAMEE NO.:  [id number]  MEDICAL RECORD NO.:  [id number]  LOCATION:  [id number]                        FACILITY:  [facility]  PHYSICIAN:  Astrid Divine. Marcelino Scot, M.D. DATE OF BIRTH:  24-Feb-1941  DATE OF PROCEDURE:  01/27/2014 DATE OF DISCHARGE:                              OPERATIVE REPORT   PREOPERATIVE DIAGNOSIS:  Right 4-part proximal humerus fracture.  POSTOPERATIVE DIAGNOSIS:  Right 4-part proximal humerus fracture.  PROCEDURE:  Open reduction and internal fixation of right proximal humerus fracture, including repair of the tuberosities.  SURGEON:  Astrid Divine. Marcelino Scot, M.D.  ASSISTANT:  Jari Pigg, PA-C.  ANESTHESIA:  General supplemented with regional block.  SPECIMENS:  None.  DISPOSITION:  To PACU.  CONDITION:  Stable.  BRIEF SUMMARY AND INDICATION FOR PROCEDURE:  Wendy Munoz is a 73 year old right-hand dominant female who sustained a 4-part proximal humerus fracture that developed increasing displacement at the surgical neck with medial displacement of the shaft.  I discussed with the patient and her husband the risks and benefits of surgical repair including the possibility of failure to heal, loss of reduction, nerve injury, vessel injury, avascular necrosis, hardware or screw cut out, need for further surgery, including conversion to total arthroplasty, or hemiarthroplasty and many others.  The patient and her husband understood these risk including those of heart attack, stroke, infection, neurovascular injury, and wished to proceed.  BRIEF SUMMARY OF PROCEDURE:  Wendy Munoz received a regional block preoperatively, was taken to the operating room and general anesthesia was induced.  She was then positioned on the fracture table.  A small bump was placed under her right shoulder.  A standard prep and drape was performed.  Dissection was carried down through the deltopectoral interval after appropriate time-out.  The cephalic vein was  identified and retracted laterally with the deltoid.  The deltopectoral interval was developed and the clavipectoral fascia incised the long head.  Her biceps tendon was identified along the groove up into the head.  This was partially exposed to facilitate plate placement and rotational reduction of the shaft fragment.  I did attempt to keep all of the tuberosity and head segment together.  However, I began to instrument the 4 distinct parts, became much more mobile and visible. Consequently, the attempted internal fixation was temporarily delayed while we used a #2 FiberWire to reconstruct the tuberosities to 1 another and then ultimately secured them underneath the plate and in the case of the greater tuberosity and supraspinatus attachment to the plate.  Standard fixation was placed in the shaft, followed by a locked fixation and locked screws were used in the head.  The bone quality was quite poor, but we were able to restore appropriate continuity of the fragments and the proximal humerus did move affectively as the unit following repair.  Final images showed appropriate reduction, hardware placement, trajectory and length.  Wound was irrigated thoroughly and closed in layered fashion using 0 Vicryl for reapproximation of the deltopectoral interval, 2-0 Vicryl, 3-0 nylon.  Ainsley Spinner, PA-C did assist me throughout the procedure and was absolutely necessary for the safe and effective completion of the case. Retraction was required at all times  as well as assistance with both provisional and definitive internal fixation, and maintenance of reduction during those maneuvers and procedures.  PROGNOSIS:  Wendy Munoz is at risk for screw cut out and either because of biomechanical failure, avascular necrosis or nonunion, particularly given the rather impaired bone quality.  We will further evaluate her bone health and make medical recommendations to optimize her healing. She will be  nonweightbearing through that arm for the next 6-8 weeks with initial pendulum, then PROM progressing to AAROM at 5-6 weeks and likely not beginning active motion until the 8 week mark, but again we will follow with x-rays and clinical examination to fine tune her regimen.  We anticipate a 1-2 days stay in the hospital  given the magnitude of her surgery and depending somewhat on her ability to manage pain and progressive therapy postoperatively.     Astrid Divine. Marcelino Scot, M.D.     MHH/MEDQ  D:  01/27/2014  T:  01/28/2014  Job:  [id number]

## 2014-01-28 NOTE — Discharge Summary (Signed)
Orthopaedic Trauma Service (OTS)  Patient ID: Wendy Munoz MRN: [id number] DOB/AGE: 22-Sep-1941 73 y.o.  Admit date: 01/27/2014 Discharge date: 01/28/2014  Admission Diagnoses: Right four-part proximal humerus fracture Hypothyroidism Pre-diabetes Hypertension Osteoporosis GERD  Discharge Diagnoses:  Active Problems:   Proximal humerus fracture   Hypothyroidism   Prediabetes   HTN (hypertension)   Osteoporosis   GERD (gastroesophageal reflux disease)   Procedures Performed: 01/27/2014- Dr. Marcelino Scot Open reduction and internal fixation of right proximal humerus fracture, including repair of the tuberosities   Discharged Condition: good  Hospital Course:   Patient is a very pleasant 73 year old right-hand-dominant female who sustained a four-part proximal humerus fracture at the end of March sustaining a fall at home. She was ultimately referred to the orthopedic trauma specialists for definitive fixation. Patient was taken to the operating room on 01/28/2012 for the procedure noted above. Patient tolerated surgery very well. She had a preop block. After surgery she was taken to PACU for recovery from anesthesia and was transferred to the orthopedic floor for continued observation and pain control. Patient did remarkably well on postoperative day #1 her block was still functioning on postop day #1. She was mobilizing very well without any issues. She was tolerating a diet and voiding on her own. She wished to be discharged home on postoperative day #1. Patient is discharged in stable condition on postoperative day #1. No acute issues were noted during her overnight observation.  Consults: None  Significant Diagnostic Studies: None  Treatments: IV hydration, antibiotics: vancomycin, analgesia: preoperative block, therapies: OT and RN and surgery: As above  Discharge Exam:  Orthopaedic Trauma Service Progress Note  Subjective  Doing well Sitting in bedside chair States  she is not having pain Has not taken anything for pain other then 2 ES tylenol that was given to her yesterday pre-op Ready to go home    Objective   BP 120/55  Pulse 95  Temp(Src) 98.6 F (37 C) (Oral)  Resp 16  Ht 5\' 3"  (1.6 m)  Wt 100.699 kg (222 lb)  BMI 39.34 kg/m2  SpO2 93%  Intake/Output     04/07 0701 - 04/08 0700 04/08 0701 - 04/09 0700    P.O.  120    I.V. (mL/kg) 1976.7 (19.6) 75 (0.7)    Total Intake(mL/kg) 1976.7 (19.6) 195 (1.9)    Blood 150     Total Output 150      Net +1826.7 +195          Urine Occurrence 2 x       Labs  No new labs  Exam  Gen: awake and alert, NAD, comfortable Lungs: clear, unlabored Cardiac: s1 and s2 Abd: + BS, NT Ext:       Left Upper Extremity               Dressing c/d/i             R/U/M/ax sensation grossly intact             Distal motor function intact             Ext warm               + radial pulse             Swelling well controlled   Assessment and Plan   POD/HD#: 1  73 y/o female s/p fall with 4-part R proximal humerus fx s/p ORIF  1. Fall  2. R  proximal humerus fx s/p ORIF             NWB R arm             Pendulums R shoulder only x 2 weeks then we will progress accordingly   Pt will be NWB on R arm for 6-8 weeks   Gentle pendulums and PROM of R shoulder only for the next 2 weeks or so. NO AGGRESSIVE IR or   ER of R shoulder. NO ACTIVE motion to R shoulder   AROM R elbow, forearm, wrist and hand             Ice prn             Dressing change PRN             Sling when up and moving             Ok to remove when seated or lying down  3. Medical issues             Resume home meds  4. Metabolic bone disease               Vitamin D levels low normal             Continue with home supplementation             Will need DEXA as out pt              5. DVT/PE prophylaxis             Will not require pharmacologic DVT prophylaxis  6. FEN             Diet as tolerated  7. Pain control              Extra-strength tylenol 1-2 q 6h prn pain             Will also write for Ultram 50-100 mg po q6h prn pain between tylenol  8. Dispo             Dc home today after OT eval               Follow up with ortho in 10-14 days     Jari Pigg, PA-C [facility] [phone] (P) 01/28/2014 9:05 AM   Disposition:  home      Discharge Orders   Future Orders Complete By Expires   Call MD / Call 911  As directed    Constipation Prevention  As directed    Diet - low sodium heart healthy  As directed    Discharge instructions  As directed    Driving restrictions  As directed    Increase activity slowly as tolerated  As directed    Lifting restrictions  As directed    Non weight bearing  As directed    Questions:     Laterality:     Extremity:         Medication List    STOP taking these medications       HYDROcodone-acetaminophen 10-325 MG per tablet  Commonly known as:  NORCO     HYDROcodone-acetaminophen 5-325 MG per tablet  Commonly known as:  NORCO/VICODIN      TAKE these medications       acetaminophen 500 MG tablet  Commonly known as:  TYLENOL  Take 1-2 tablets (500-1,000 mg total) by mouth every 6 (six) hours as needed for moderate pain or fever.  aspirin EC 81 MG tablet  Take 81 mg by mouth daily.     CALCIUM MAGNESIUM PO  Take 1 capsule by mouth daily.     cetirizine 10 MG tablet  Commonly known as:  ZYRTEC  Take 10 mg by mouth at bedtime.     DSS 100 MG Caps  Take 100 mg by mouth 2 (two) times daily.     levothyroxine 50 MCG tablet  Commonly known as:  SYNTHROID, LEVOTHROID  Take 50 mcg by mouth daily before breakfast.     lisinopril 2.5 MG tablet  Commonly known as:  PRINIVIL,ZESTRIL  Take 2.5 mg by mouth daily.     traMADol 50 MG tablet  Commonly known as:  ULTRAM  Take 1-2 tablets (50-100 mg total) by mouth every 6 (six) hours as needed for moderate pain or severe pain.     Vitamin D 2000 UNITS tablet  Take 4,000  Units by mouth daily.       Follow-up Information   Follow up with Rozanna Box, MD.   Specialty:  Orthopedic Surgery   Contact information:   [address] [place] [address] [phone]       Discharge Instructions and Plan:  Pt has sustained a severe injury to her right upper extremity.  We were able to achieve a technically successful ORIF of the right proximal humerus.   The pt does have poor bone quality which will hinder the healing process but it was felt that ORIF would give her the best chance at full recovery and maximize ROM recovery  Pt will be NWB on right arm for 6-8 weeks Gentle pendulums and PROM of right shoulder only for the next 2 weeks or so. NO AGGRESSIVE IR or ER of right shoulder. NO ACTIVE motion to right shoulder. We will likely begin passive motion of the  right  shoulder after her first office followup AROM right elbow, forearm, wrist and hand Dressing changes as needed, see wound care instructions for details Ice prn  Sling for comfort and when up out of bed Ace wrap to right arm, remove daily to allow skin to breathe  Follow up in 10-14 days, call for appointment Xrays and removal of sutures at that time Will initiate passive motion on the two-week mark AAROM of the right shoulder around the 4-6 week mark, AROM around the 6-8 week mark and resistance activities for the right shoulder around the 8 week mark Please call the office with questions.   Signed:  Jari Pigg, PA-C [facility] [phone] (P) 01/28/2014, 9:33 AM

## 2014-02-02 ENCOUNTER — Encounter (HOSPITAL_COMMUNITY): Payer: Self-pay | Admitting: Orthopedic Surgery

## 2014-02-03 LAB — VITAMIN D 1,25 DIHYDROXY
VITAMIN D 1, 25 (OH) TOTAL: 44 pg/mL (ref 18–72)
Vitamin D2 1, 25 (OH)2: <8 pg/mL
Vitamin D3 1, 25 (OH)2: 44 pg/mL

## 2014-12-02 ENCOUNTER — Inpatient Hospital Stay (HOSPITAL_COMMUNITY)
Admission: EM | Admit: 2014-12-02 | Discharge: 2014-12-04 | DRG: 247 | Disposition: A | Discharge status: Home / Self Care | Payer: Medicare Other | Source: Ambulatory Visit | Attending: Interventional Cardiology | Admitting: Interventional Cardiology

## 2014-12-02 ENCOUNTER — Encounter (HOSPITAL_COMMUNITY): Payer: Self-pay | Admitting: Emergency Medicine

## 2014-12-02 ENCOUNTER — Encounter (HOSPITAL_COMMUNITY)
Admission: EM | Disposition: A | Discharge status: Home / Self Care | Payer: Self-pay | Source: Ambulatory Visit | Attending: Interventional Cardiology

## 2014-12-02 DIAGNOSIS — I2582 Chronic total occlusion of coronary artery: Secondary | ICD-10-CM | POA: Diagnosis present

## 2014-12-02 DIAGNOSIS — Z7982 Long term (current) use of aspirin: Secondary | ICD-10-CM

## 2014-12-02 DIAGNOSIS — R7309 Other abnormal glucose: Secondary | ICD-10-CM | POA: Diagnosis present

## 2014-12-02 DIAGNOSIS — I2111 ST elevation (STEMI) myocardial infarction involving right coronary artery: Secondary | ICD-10-CM

## 2014-12-02 DIAGNOSIS — K219 Gastro-esophageal reflux disease without esophagitis: Secondary | ICD-10-CM | POA: Diagnosis present

## 2014-12-02 DIAGNOSIS — Z96653 Presence of artificial knee joint, bilateral: Secondary | ICD-10-CM | POA: Diagnosis present

## 2014-12-02 DIAGNOSIS — I2510 Atherosclerotic heart disease of native coronary artery without angina pectoris: Secondary | ICD-10-CM | POA: Diagnosis present

## 2014-12-02 DIAGNOSIS — E039 Hypothyroidism, unspecified: Secondary | ICD-10-CM | POA: Diagnosis present

## 2014-12-02 DIAGNOSIS — I480 Paroxysmal atrial fibrillation: Secondary | ICD-10-CM | POA: Diagnosis present

## 2014-12-02 DIAGNOSIS — I471 Supraventricular tachycardia: Secondary | ICD-10-CM | POA: Diagnosis not present

## 2014-12-02 DIAGNOSIS — I255 Ischemic cardiomyopathy: Secondary | ICD-10-CM | POA: Diagnosis present

## 2014-12-02 DIAGNOSIS — I10 Essential (primary) hypertension: Secondary | ICD-10-CM | POA: Diagnosis present

## 2014-12-02 DIAGNOSIS — I2119 ST elevation (STEMI) myocardial infarction involving other coronary artery of inferior wall: Principal | ICD-10-CM | POA: Diagnosis present

## 2014-12-02 DIAGNOSIS — M810 Age-related osteoporosis without current pathological fracture: Secondary | ICD-10-CM | POA: Diagnosis present

## 2014-12-02 DIAGNOSIS — Z7951 Long term (current) use of inhaled steroids: Secondary | ICD-10-CM | POA: Diagnosis not present

## 2014-12-02 DIAGNOSIS — Z955 Presence of coronary angioplasty implant and graft: Secondary | ICD-10-CM

## 2014-12-02 DIAGNOSIS — R739 Hyperglycemia, unspecified: Secondary | ICD-10-CM | POA: Diagnosis present

## 2014-12-02 HISTORY — DX: Ischemic cardiomyopathy: I25.5

## 2014-12-02 HISTORY — DX: Other supraventricular tachycardia: I47.19

## 2014-12-02 HISTORY — DX: Atherosclerotic heart disease of native coronary artery without angina pectoris: I25.10

## 2014-12-02 HISTORY — DX: Supraventricular tachycardia: I47.1

## 2014-12-02 HISTORY — PX: LEFT HEART CATHETERIZATION WITH CORONARY ANGIOGRAM: SHX5451

## 2014-12-02 HISTORY — DX: Paroxysmal atrial fibrillation: I48.0

## 2014-12-02 LAB — POCT I-STAT, CHEM 8
BUN: 14 mg/dL (ref 6–23)
CHLORIDE: 100 mmol/L (ref 96–112)
CREATININE: 0.7 mg/dL (ref 0.50–1.10)
Calcium, Ion: 1.1 mmol/L — ABNORMAL LOW (ref 1.13–1.30)
GLUCOSE: 137 mg/dL — AB (ref 70–99)
HCT: 31 % — ABNORMAL LOW (ref 36.0–46.0)
Hemoglobin: 10.5 g/dL — ABNORMAL LOW (ref 12.0–15.0)
Potassium: 3.4 mmol/L — ABNORMAL LOW (ref 3.5–5.1)
Sodium: 130 mmol/L — ABNORMAL LOW (ref 135–145)
TCO2: 20 mmol/L (ref 0–100)

## 2014-12-02 LAB — MRSA PCR SCREENING: MRSA by PCR: NEGATIVE

## 2014-12-02 LAB — POCT ACTIVATED CLOTTING TIME: Activated Clotting Time: 362 seconds

## 2014-12-02 LAB — TROPONIN I: TROPONIN I: 10.17 ng/mL — AB (ref <0.031)

## 2014-12-02 LAB — GLUCOSE, CAPILLARY: Glucose-Capillary: 117 mg/dL — ABNORMAL HIGH (ref 70–99)

## 2014-12-02 SURGERY — LEFT HEART CATHETERIZATION WITH CORONARY ANGIOGRAM

## 2014-12-02 MED ORDER — ACETAMINOPHEN 325 MG PO TABS: 650.0000 mg | ORAL_TABLET | ORAL | Status: DC | PRN

## 2014-12-02 MED ORDER — HEPARIN (PORCINE) IN NACL 2-0.9 UNIT/ML-% IJ SOLN
INTRAMUSCULAR | Status: AC
  Filled 2014-12-02: qty 1000

## 2014-12-02 MED ORDER — OXYCODONE-ACETAMINOPHEN 5-325 MG PO TABS: 1.0000 | ORAL_TABLET | ORAL | Status: DC | PRN

## 2014-12-02 MED ORDER — ONDANSETRON HCL 4 MG/2ML IJ SOLN: 4.0000 mg | Freq: Four times a day (QID) | INTRAMUSCULAR | Status: DC | PRN

## 2014-12-02 MED ORDER — TICAGRELOR 90 MG PO TABS
ORAL_TABLET | ORAL | Status: AC
  Filled 2014-12-02: qty 1

## 2014-12-02 MED ORDER — SODIUM CHLORIDE 0.9 % IV SOLN
INTRAVENOUS | Status: AC
  Administered 2014-12-02 (×2): via INTRAVENOUS

## 2014-12-02 MED ORDER — NITROGLYCERIN 1 MG/10 ML FOR IR/CATH LAB
INTRA_ARTERIAL | Status: AC
  Filled 2014-12-02: qty 10

## 2014-12-02 MED ORDER — LEVOTHYROXINE SODIUM 50 MCG PO TABS
50.0000 ug | ORAL_TABLET | Freq: Every day | ORAL | Status: DC
  Administered 2014-12-03 – 2014-12-04 (×2): 50 ug via ORAL
  Filled 2014-12-02 (×3): qty 1

## 2014-12-02 MED ORDER — MIDAZOLAM HCL 2 MG/2ML IJ SOLN
INTRAMUSCULAR | Status: AC
  Filled 2014-12-02: qty 2

## 2014-12-02 MED ORDER — MORPHINE SULFATE 2 MG/ML IJ SOLN: 2.0000 mg | INTRAMUSCULAR | Status: DC | PRN

## 2014-12-02 MED ORDER — SODIUM CHLORIDE 0.9 % IV SOLN: INTRAVENOUS | Status: DC

## 2014-12-02 MED ORDER — TICAGRELOR 90 MG PO TABS
90.0000 mg | ORAL_TABLET | Freq: Two times a day (BID) | ORAL | Status: DC
  Administered 2014-12-02 – 2014-12-04 (×4): 90 mg via ORAL
  Filled 2014-12-02 (×6): qty 1

## 2014-12-02 MED ORDER — BIVALIRUDIN 250 MG IV SOLR
INTRAVENOUS | Status: AC
  Filled 2014-12-02: qty 250

## 2014-12-02 MED ORDER — LIDOCAINE HCL (PF) 1 % IJ SOLN
INTRAMUSCULAR | Status: AC
  Filled 2014-12-02: qty 30

## 2014-12-02 MED ORDER — ASPIRIN 81 MG PO CHEW
81.0000 mg | CHEWABLE_TABLET | Freq: Every day | ORAL | Status: DC
  Administered 2014-12-03 – 2014-12-04 (×2): 81 mg via ORAL
  Filled 2014-12-02 (×2): qty 1

## 2014-12-02 NOTE — H&P (Signed)
History and Physical  Patient ID: Wendy Munoz MRN: [id number], SOB: [birth year] 74 y.o. Date of Encounter: 12/02/2014, 2:31 PM  Primary Physician: Myriam Jacobson, MD Primary Cardiologist: none  Chief Complaint: Diaphoresis, dyspnea  HPI: 74 y.o. female w/ PMHx significant for HTN, hypothyroidism (s/p thyroid ablation), osteoporosis, GERD, and arthritis who presented to [facility] on 12/02/2014 with complaints of diaphoresis, SOB, fatigue, and recent restlessness. Patient states he has gone several nights without sleeping well recently and awoke this AM feeling short of breath, fatigued, weak, and diaphoretic. Patient denies ever having chest pain. Family at bedside today, also stated patient had significant pallor this AM. No previous cardiac history, however, does state she had an episode of atrial fibrillation several years ago associated w/ her thyroid dysfunction (prior to ablation). Ar[facility] to [facility] ED as code STEMI, taken emergently to cath lab.    Past Medical History  Diagnosis Date  . Hypothyroidism   . Prediabetes   . HTN (hypertension)   . Osteoporosis   . Dysrhythmia 1996    Afib- with thyroid issues 1996, no problem since.  Marland Kitchen GERD (gastroesophageal reflux disease)     tums prn  . Arthritis     fingers and wrist.     Surgical History:  Past Surgical History  Procedure Laterality Date  . Inguinal hernia repair Right   . Umbilical hernia repair      as a baby  . Total knee arthroplasty Right 2006  . Total knee arthroplasty Left 2011  . Cataract extraction Bilateral   . Colonoscopy    . Breast surgery      breast biospy  . Eye surgery Bilateral 2005, 2006    Cataract with lens ImplaNT   . Orif humerus fracture Right 01/27/2014    Procedure: RIGHT OPEN REDUCTION INTERNAL FIXATION (ORIF) PROXIMAL HUMERUS FRACTURE;  Surgeon: Rozanna Box, MD;  Location: [place];  Service: Orthopedics;  Laterality: Right;     Home Meds: Prior to Admission  medications   Medication Sig Start Date End Date Taking? Authorizing Provider  acetaminophen (TYLENOL) 500 MG tablet Take 1-2 tablets (500-1,000 mg total) by mouth every 6 (six) hours as needed for moderate pain or fever. 01/28/14   Jari Pigg, PA-C  aspirin EC 81 MG tablet Take 81 mg by mouth daily.    Historical Provider, MD  Calcium-Magnesium-Vitamin D (CALCIUM MAGNESIUM PO) Take 1 capsule by mouth daily.    Historical Provider, MD  cetirizine (ZYRTEC) 10 MG tablet Take 10 mg by mouth at bedtime.    Historical Provider, MD  Cholecalciferol (VITAMIN D) 2000 UNITS tablet Take 4,000 Units by mouth daily.    Historical Provider, MD  docusate sodium 100 MG CAPS Take 100 mg by mouth 2 (two) times daily. 01/28/14   Jari Pigg, PA-C  levothyroxine (SYNTHROID, LEVOTHROID) 50 MCG tablet Take 50 mcg by mouth daily before breakfast.    Historical Provider, MD  lisinopril (PRINIVIL,ZESTRIL) 2.5 MG tablet Take 2.5 mg by mouth daily.    Historical Provider, MD  traMADol (ULTRAM) 50 MG tablet Take 1-2 tablets (50-100 mg total) by mouth every 6 (six) hours as needed for moderate pain or severe pain. 01/28/14   Jari Pigg, PA-C    Allergies:  Allergies  Allergen Reactions  . Keflex [Cephalexin] Rash    History   Social History  . Marital Status: Married    Spouse Name: N/A  . Number of Children: N/A  .  Years of Education: N/A   Occupational History  . retired     Social History Main Topics  . Smoking status: Never Smoker   . Smokeless tobacco: Not on file  . Alcohol Use: No  . Drug Use: No  . Sexual Activity: Not on file   Other Topics Concern  . Not on file   Social History Narrative  . No narrative on file     Family History  Problem Relation Age of Onset  . Breast cancer    . CAD Mother     Review of Systems: General: Positive for diaphoresis, fatigue. Denies fever and appetite change.  Respiratory: Positive for SOB, DOE. Denies cough, and wheezing.   Cardiovascular: Denies  chest pain, PND, orthopnea, LE edema, and palpitations.  Gastrointestinal: Denies nausea, vomiting, abdominal pain, and diarrhea Musculoskeletal: Denies myalgias, arthralgias, back pain, and gait problem.  Neurological: Positive for generalized weakness. Denies dizziness, syncope, lightheadedness, and headaches.  Psychiatric/Behavioral: Positive for sleep disturbance. Denies mood changes and agitation.   Physical Exam: Pulse 64. General: Elderly, obese white female, well developed, well nourished, alert and oriented, in no acute distress.  HEENT: Normocephalic, atraumatic, sclera anicteric Neck: Supple. Carotids 2+ without bruits. JVP normal. Lungs: Clear bilaterally to auscultation without wheezes, rales, or rhonchi. Breathing is unlabored. Heart: RRR with normal S1 and S2. No murmurs, rubs, or gallops appreciated. Abdomen: Soft, non-tender, non-distended with normoactive bowel sounds. No hepatomegaly. No rebound/guarding. No obvious abdominal masses. Msk:  Strength and tone appear normal for age. Extremities: No clubbing, cyanosis, or edema.  Distal pedal pulses are 2+ and equal bilaterally. Right femoral catheter access site appears clean and dry. No swelling.  Neuro: CNII-XII intact, moves all extremities spontaneously. Psych:  Responds to questions appropriately with a normal affect. Skin: warm and dry without rash   Labs:   Lab Results  Component Value Date   WBC 6.1 01/27/2014   HGB 10.2* 01/27/2014   HCT 30.5* 01/27/2014   MCV 99.0 01/27/2014   PLT 283 01/27/2014   No results for input(s): NA, K, CL, CO2, BUN, CREATININE, CALCIUM, PROT, BILITOT, ALKPHOS, ALT, AST, GLUCOSE in the last 168 hours.  Invalid input(s): LABALBU No results for input(s): CKTOTAL, CKMB, TROPONINI in the last 72 hours. No results found for: CHOL, HDL, LDLCALC, TRIG No results found for: DDIMER   CARDIAC STUDIES:   LEFT HEART CATHETERIZATION:  ANGIOGRAPHIC DATA: The left main coronary artery  is widely patent..  The left anterior descending artery is a large vessel that wraps around the left ventricular apex. There is proximal somewhat segmental eccentric ulcerated plaque in the proximal LAD that obstructs the vessel between 50 and 70%. The mid LAD contains luminal irregularity. There is eccentric 75-80% stenosis in the apical LAD. The diagonals are widely patent..  The left circumflex artery is widely patent. There is diffuse 30-40% narrowing in the proximal circumflex. The first obtuse marginal contains ostial 70% narrowing. The vessel is very tiny in diameter and also small in distribution. The second marginal is a large vessel that bifurcates on the inferolateral wall and is widely patent. There is proximal 50% narrowing. The third obtuse marginal is small and widely patent..  The right coronary artery is totally occluded proximally. After PCI the vessel is widely patent without other significant disease. A moderate size PDA and 2 left ventricular branches are noted.  PCI RESULT: The RCA contained proximal 100% obstruction. Reperfusion was achieved mechanically with the guidewire which revealed a 95-99% proximal focal stenosis.  After direct stenting to a final balloon diameter of 3.65 mm at 13 atm, 0% stenosis was noted and a large normal distal vessel was revealed. TIMI grade 3 flow was noted. Decreased myocardial blush was noted.  LEFT VENTRICULOGRAM: Left ventricular angiogram was done in the 30 RAO projection and revebasal to mid inferior wall severe hypokinesis with an EF of 45-50%   IMPRESSION: 1. Acute inferior myocardial infarction due to proximal robotic occlusion of the RCA treated with stenting to 0% stenosis with TIMI grade 3 flow but decreased myocardial blush. 2. Proximal 50-70% ruptured plaque in the LAD. There is moderate mid and apical LAD disease. I believe all of this can be treated medically. Has now patient we will likely do a myocardial perfusion study to  assess for evidence of anterior wall ischemia. This will provide a baseline for future reference concerning the LAD. 3. Widely patent circumflex 4. Inferior severe hypokinesis with low normal LVEF 45-50%   RECOMMENDATION:. Anticipate 48-72 hour hospitalization. She is a fast track candidate. High-dose statin therapy in regards to the acute infarct vessel and the LAD Blood pressure control Diabetes control. Aspirin and Brilinta 12 months.   ASSESSMENT AND PLAN:  74 y.o. female w/ PMHx significant for HTN, hypothyroidism, osteoporosis, GERD, and arthritis, admitted for acute inferior MI.   STEMI: Patient presented as Code STEMI, reports having SOB and diaphoresis at home this AM. Found to have acute inferior MI, now s/p PCI to RCA. Also noted to have proximal 50-70% ruptured plaque in LAD, can be treated medically. Tolerated procedure well. -Admit to CCU -Continue IVF for 6 more hours; NS @ 150 cc/hr -Continue ASA 81 mg + Brilinta  90 mg bid -High dose statin; Lipitor 80 mg qhs -Start BB when BP tolerates; metoprolol 12.5 mg bid in AM -CBC, BMP pending   HTN: Hypotensive on presentation, now normotensive. Taking only Lisinopril 2.5 mg daily at home.  -IVF as above -Restart Lisinopril in AM -Start Metoprolol in AM as above  Hypothyroidism: S/p thyroid ablation some time ago, now takes Synthroid 50 mcg at home. Reports having elevated TSH at most recent PCP visit.  -Repeat TSH  Prediabetes: Previous h/o prediabetes, no HbA1c in EPIC, patient states most recent HbA1c 6.1.  -ISS-S w/ CBG's AC/HS -HBA1c pending   Signed, Luanne Bras MD 12/02/2014, 2:31 PM

## 2014-12-02 NOTE — CV Procedure (Addendum)
Left Heart Catheterization with Coronary Angiography with PCI RCA Report  Wendy Munoz  74 y.o.  female 04-11-1941  Procedure Date: 12/02/2014 Referring Physician: Helyn Numbers, M.D. Primary Cardiologist:: Helyn Numbers, M.D.  INDICATIONS: Inferior ST elevation MI  PROCEDURE: 1. Left heart catheterization; 2. Coronary angiography; 3. Left ventriculography; 4. DES RCA culprit for acute MI  CONSENT:  The risks, benefits, and details of the procedure were explained in detail to the patient. Risks including death, stroke, heart attack, kidney injury, allergy, limb ischemia, bleeding and radiation injury were discussed.  The patient verbalized understanding and wanted to proceed.  Informed written consent was obtained.  PROCEDURE TECHNIQUE:  After Xylocaine anesthesia a 6 French sheath was placed in the right femoral artery using the modified Seldinger technique.  I contemplated the radial approach but the patient had relative hypotension, difficult radial to palpate, and I anticipated the possibility of needing temporary pacing. Coronary angiography was done using a 6 F JR4 guide catheter. This revealed total proximal occlusion of the right coronary. Bivalirudin by bolus and then infusion was started and an ACT greater than 300 was achieved. We use a pro-water guidewire to cross the stenosis in the proximal RCA. This led to immediate recanalization and a relatively focal 90% stenosis in the proximal RCA was noted. We then direct stented the region of disease using a Promus Premier 3.5 mm x 20 mm long DES. 13 atm 2 was performed. A very nice angiographic result was obtained. No postdilatation was felt indicated. Brilinta 190 mg was given orally.  We then turned out attention to the left coronary system. A  JL 3.5 cm diagnostic catheter that had been pulled anticipating the radial approach was used for left coronary angiography. I used a 5 [place] A2 multipurpose catheter for left  ventriculography by hand injection.  The left anterior descending has an ulcerated plaque that obstructs the vessel by less than 50-60%. The inferior wall was akinetic on the ventriculogram. The case was terminated. Hemostasis was achieved with Angio-Seal.   CONTRAST:  Total of  80 cc.  COMPLICATIONS:  None   HEMODYNAMICS:  Aortic pressure  107/57 mmHg; LV pres107/11 mmHg; LVED18 mmHg  ANGIOGRAPHIC DATA:   The left main coronary artery is  widely patent..  The left anterior descending artery is  a large vessel that wraps around the left ventricular apex. There is proximal somewhat segmental eccentric ulcerated plaque in the proximal LAD that obstructs the vessel between 50 and 70%. The mid LAD contains luminal irregularity. There is eccentric 75-80% stenosis in the apical LAD. The diagonals are widely patent..  The left circumflex artery is  widely patent. There is diffuse 30-40% narrowing in the proximal circumflex. The first obtuse marginal contains ostial 70% narrowing. The vessel is very tiny in diameter and also small in distribution. The second marginal is a large vessel that bifurcates on the inferolateral wall and is widely patent. There is proximal 50% narrowing. The third obtuse marginal is small and widely patent..  The right coronary artery is  totally occluded proximally. After PCI the vessel is widely patent without other significant disease. A moderate size PDA and 2 left ventricular branches are noted.Marland Kitchen   PCI RESULT:  The RCA contained proximal 100% obstruction. Reperfusion was achieved mechanically with the guidewire which revealed a 95-99% proximal focal stenosis. After direct stenting to a final balloon diameter of 3.65 mm at 13 atm, 0% stenosis was noted and a  large normal distal vessel was revealed. TIMI grade 3 flow was noted. Decreased myocardial blush was noted.  LEFT VENTRICULOGRAM:  Left ventricular angiogram was done in the 30 RAO projection and revebasal to mid  inferior wall severe hypokinesis with an EF of 45-50%   IMPRESSION: 1. Acute inferior myocardial infarction due to proximal thrombotic occlusion of the RCA treated with stenting to 0% stenosis with TIMI grade 3 flow but decreased myocardial blush. 2. Proximal 50-70% ruptured plaque in the LAD. There is moderate mid and apical LAD disease. I believe all of this can be treated medically. Has now patient we will likely do a myocardial perfusion study to assess for evidence of anterior wall ischemia. This will provide a baseline for future reference concerning the LAD. 3. Widely patent circumflex 4. Inferior severe hypokinesis with low normal LVEF 45-50%   RECOMMENDATION:. Anticipate 48-72 hour hospitalization. She is a fast track candidate. High-dose statin therapy in regards to the acute infarct vessel and the LAD Blood pressure control Diabetes control. Aspirin and Brilinta 12 months.

## 2014-12-02 NOTE — Progress Notes (Signed)
CRITICAL VALUE ALERT  Critical value received:  Troponin 10.17  Date of notification:  12-02-2014  Time of notification:  2106  Critical value read back:Yes.    Nurse who received alert:  Hadassah Pais rn   MD notified (1st page):  Expected lab value

## 2014-12-02 NOTE — Progress Notes (Signed)
   12/02/14 1100  Clinical Encounter Type  Visited With Family  Visit Type Initial;Code  Stress Factors  Family Stress Factors Health changes   Chaplain was paged to a code stemi at 10:19 AM. Chaplain was not able to check on patient until 11 AM. Chaplain was notified that the family of the patient was in the short stay waiting room. Chaplain introduced himself to patient's family and let them know that he was available for support. One of the patient's family members asked chaplain to keep them in his prayers. Patient's family already had a pager and were waiting on being updated by a family member. Chaplain will continue to provide emotional and spiritual support for patient and patient's family as needed. Marielys Trinidad, Claudius Sis, Chaplain  1:22 PM

## 2014-12-02 NOTE — Care Management Note (Addendum)
    Page 1 of 1   12/04/2014     3:50:04 PM CARE MANAGEMENT NOTE 12/04/2014  Patient:  Wendy Munoz, Wendy Munoz   Account Number:  [id number]  Date Initiated:  12/02/2014  Documentation initiated by:  Elissa Hefty  Subjective/Objective Assessment:   adm w mi     Action/Plan:   lives w husband, pcp dr Janeice Robinson   Anticipated DC Date:  12/04/2014   Anticipated DC Plan:  [place]  CM consult  Medication Assistance      Choice offered to / List presented to:             Status of service:  Completed, signed off Medicare Important Message given?  NA - LOS <3 / Initial given by admissions (If response is "NO", the following Medicare IM given date fields will be blank) Date Medicare IM given:   Medicare IM given by:   Date Additional Medicare IM given:   Additional Medicare IM given by:    Discharge Disposition:  HOME/SELF CARE  Per UR Regulation:  Reviewed for med. necessity/level of care/duration of stay  If discussed at [place] of Stay Meetings, dates discussed:    Comments:  12/04/14- 1200- Wendy Gibbons RN, BSN [phone] S/W Wendy Munoz @ [facility] # [id number]  BRILINITA 90 MG BID COVER-YES CO-PAY $ 40.00    30 DAY SUPPLY TIER-3 DRUG PRIOR APPROVAL - [place]  2/10 1519 Wendy dowell rn,bsn gave pt 30day free brilinta card . pt states has managed medicare plan that covers meds.

## 2014-12-03 DIAGNOSIS — I2119 ST elevation (STEMI) myocardial infarction involving other coronary artery of inferior wall: Principal | ICD-10-CM

## 2014-12-03 LAB — BASIC METABOLIC PANEL
ANION GAP: 6 (ref 5–15)
BUN: 11 mg/dL (ref 6–23)
CO2: 24 mmol/L (ref 19–32)
Calcium: 8.6 mg/dL (ref 8.4–10.5)
Chloride: 110 mmol/L (ref 96–112)
Creatinine, Ser: 0.94 mg/dL (ref 0.50–1.10)
GFR, EST AFRICAN AMERICAN: 68 mL/min — AB (ref >90)
GFR, EST NON AFRICAN AMERICAN: 59 mL/min — AB (ref >90)
Glucose, Bld: 115 mg/dL — ABNORMAL HIGH (ref 70–99)
POTASSIUM: 3.4 mmol/L — AB (ref 3.5–5.1)
SODIUM: 140 mmol/L (ref 135–145)

## 2014-12-03 LAB — CBC
HEMATOCRIT: 30.2 % — AB (ref 36.0–46.0)
Hemoglobin: 10 g/dL — ABNORMAL LOW (ref 12.0–15.0)
MCH: 31.4 pg (ref 26.0–34.0)
MCHC: 33.1 g/dL (ref 30.0–36.0)
MCV: 95 fL (ref 78.0–100.0)
Platelets: 203 10*3/uL (ref 150–400)
RBC: 3.18 MIL/uL — AB (ref 3.87–5.11)
RDW: 13.1 % (ref 11.5–15.5)
WBC: 5.2 10*3/uL (ref 4.0–10.5)

## 2014-12-03 LAB — TROPONIN I
Troponin I: 9.59 ng/mL (ref <0.031)
Troponin I: 7.04 ng/mL (ref <0.031)
Troponin I: 7.11 ng/mL (ref <0.031)
Troponin I: 6.99 ng/mL (ref <0.031)

## 2014-12-03 LAB — TSH: TSH: 2.33 u[IU]/mL (ref 0.350–4.500)

## 2014-12-03 MED ORDER — METOPROLOL TARTRATE 25 MG PO TABS
25.0000 mg | ORAL_TABLET | Freq: Two times a day (BID) | ORAL | Status: DC
  Administered 2014-12-03 – 2014-12-04 (×3): 25 mg via ORAL
  Filled 2014-12-03 (×3): qty 1

## 2014-12-03 MED ORDER — POTASSIUM CHLORIDE CRYS ER 20 MEQ PO TBCR
40.0000 meq | EXTENDED_RELEASE_TABLET | Freq: Once | ORAL | Status: AC
  Administered 2014-12-03: 40 meq via ORAL
  Filled 2014-12-03: qty 2

## 2014-12-03 MED ORDER — ROSUVASTATIN CALCIUM 10 MG PO TABS
10.0000 mg | ORAL_TABLET | Freq: Every day | ORAL | Status: DC
  Administered 2014-12-03: 10 mg via ORAL
  Filled 2014-12-03 (×2): qty 1

## 2014-12-03 MED ORDER — LISINOPRIL 2.5 MG PO TABS
2.5000 mg | ORAL_TABLET | Freq: Every day | ORAL | Status: DC
  Administered 2014-12-03 – 2014-12-04 (×2): 2.5 mg via ORAL
  Filled 2014-12-03 (×2): qty 1

## 2014-12-03 MED ORDER — LORATADINE 10 MG PO TABS
10.0000 mg | ORAL_TABLET | Freq: Every day | ORAL | Status: DC
  Administered 2014-12-03 – 2014-12-04 (×2): 10 mg via ORAL
  Filled 2014-12-03 (×3): qty 1

## 2014-12-03 MED FILL — Sodium Chloride IV Soln 0.9%: INTRAVENOUS | Qty: 50 | Status: AC

## 2014-12-03 NOTE — Progress Notes (Signed)
CARDIAC REHAB PHASE I   PRE:  Rate/Rhythm: 85 SR  BP:  Supine:   Sitting: 147/53  Standing:    SaO2:   MODE:  Ambulation: 270 ft   POST:  Rate/Rhythm: 107 ST  BP:  Supine:   Sitting: 130/68  Standing:    SaO2:  1125-1200 Pt walked 270 ft on RA with asst x1 with some SOB. Stopped a couple of times to catch her breath. No CP. Started MI ed. Gave MI booklet and reviewed restrictions. Pt has stent booklet and brilinta booklet, importance stressed. Gave heart healthy diet for pt to review. Pt interested in Phase 2 so will refer to [place]. Will follow up tomorrow for ed.   Graylon Good, RN BSN  12/03/2014 11:57 AM

## 2014-12-03 NOTE — Progress Notes (Signed)
Progress Note  Subjective:    Doing well this AM, no significant complaints. Denies chest pain, SOB, dizziness, or lightheadedness. No pallor on exam.   Objective:   Temp:  [98.2 F (36.8 C)-98.6 F (37 C)] 98.6 F (37 C) (02/11 0300) Pulse Rate:  [64-92] 76 (02/11 0300) Resp:  [12-23] 18 (02/11 0425) BP: (100-140)/(39-95) 121/43 mmHg (02/11 0425) SpO2:  [94 %-100 %] 94 % (02/11 0300) Weight:  [216 lb 7.9 oz (98.2 kg)] 216 lb 7.9 oz (98.2 kg) (02/10 1510) Last BM Date: 12/02/14  Filed Weights   12/02/14 1510  Weight: 216 lb 7.9 oz (98.2 kg)    Intake/Output Summary (Last 24 hours) at 12/03/14 0825 Last data filed at 12/02/14 2100  Gross per 24 hour  Intake   1590 ml  Output      0 ml  Net   1590 ml    Telemetry: NSR/sinus tachycardia  Physical Exam: General: Elderly white female, alert, cooperative, NAD. HEENT: PERRL, EOMI. Moist mucus membranes Neck: Full range of motion without pain, supple, no lymphadenopathy or carotid bruits Lungs: Clear to ascultation bilaterally, normal work of respiration, no wheezes, rales, rhonchi Heart: RRR, no murmurs, gallops, or rubs Abdomen: Soft, non-tender, non-distended, BS + Extremities: No cyanosis, clubbing, or edema. Right femoral access site appears clean and dry. No obvious hematoma.  Neurologic: Alert & oriented x3, cranial nerves II-XII intact, strength grossly intact, sensation intact to light touch   Lab Results:  Basic Metabolic Panel:  Recent Labs Lab 12/02/14 1113 12/03/14 0309  NA 130* 140  K 3.4* 3.4*  CL 100 110  CO2  --  24  GLUCOSE 137* 115*  BUN 14 11  CREATININE 0.70 0.94  CALCIUM  --  8.6    CBC:  Recent Labs Lab 12/02/14 1113 12/03/14 0309  WBC  --  5.2  HGB 10.5* 10.0*  HCT 31.0* 30.2*  MCV  --  95.0  PLT  --  203    Cardiac Enzymes:  Recent Labs Lab 12/02/14 1948 12/03/14 0309  TROPONINI 10.17* 9.59*      Medications:   Scheduled Medications: . aspirin  81 mg Oral  Daily  . levothyroxine  50 mcg Oral QAC breakfast  . ticagrelor  90 mg Oral BID     Infusions:     PRN Medications:  acetaminophen, morphine injection, ondansetron (ZOFRAN) IV, oxyCODONE-acetaminophen   Assessment and Plan:  74 y.o. female w/ PMHx significant for HTN, hypothyroidism, osteoporosis, GERD, and arthritis, admitted for acute inferior MI.   STEMI: Patient presented as Code STEMI, reports having SOB and diaphoresis at home. Found to have acute inferior MI, now s/p PCI to RCA. Also noted to have proximal 50-70% ruptured plaque in LAD, can be treated medically. Doing well this AM. No acute complaints. Troponins peaked, trend as follows:    Recent Labs Lab 12/02/14 1948 12/03/14 0309  TROPONINI 10.17* 9.59*  -Continue ASA 81 mg + Brilinta 90 mg bid -Crestor 10 mg daily; states she has had intolerance to other statins in the past.  -Start Metoprolol 25 mg bid today -Transfer to telemetry -Cardiac rehab  HTN: Normotensive. Taking only Lisinopril 2.5 mg daily at home.  -Restart Lisinopril 2.5 mg daily -Start Metoprolol as above  Hypothyroidism: S/p thyroid ablation some time ago, now takes Synthroid 50 mcg at home. Reports having elevated TSH at most recent PCP visit. Repeat 2.33.  -Continue Synthroid   Prediabetes: Previous h/o prediabetes, no HbA1c in EPIC, patient states most  recent HbA1c 6.1.  -HBA1c pending    Natasha Bence, MD PGY-2 Internal Medicine Pager: [phone]  Patient seen, examined. Available data reviewed. Agree with findings, assessment, and plan as outlined by Natasha Bence, MD. The patient is doing remarkably well day one after an inferior MI. I have reviewed her cath data, labs, and hemodynamics. She is stable on her current medical program. Metoprolol was not initiated yesterday because of hypotension after reperfusion, but will start metoprolol 25 mg BID today. Continue other post-MI med Rx as outlined above. She is willing to try daily crestor  10 mg. Advised to stop Niaspan secondary to limited benefit based on discussion of her previous lipid values. Tx tele today and if remains stable could be discharged home tomorrow morning.   Sherren Mocha, M.D. 12/03/2014 12:29 PM

## 2014-12-03 NOTE — Progress Notes (Signed)
Report given to RN on [facility]. Daughter aware of room assignment. All belongings with pt

## 2014-12-03 NOTE — Progress Notes (Signed)
Pt is alert and oriented x4. Vital signs stable. Pt was oriented to room, pt is currently eating dinner w/o complaint. Placed on telemetry. Will continue to monitor pt

## 2014-12-04 ENCOUNTER — Encounter (HOSPITAL_COMMUNITY): Payer: Self-pay | Admitting: Physician Assistant

## 2014-12-04 DIAGNOSIS — I480 Paroxysmal atrial fibrillation: Secondary | ICD-10-CM | POA: Diagnosis present

## 2014-12-04 DIAGNOSIS — I471 Supraventricular tachycardia: Secondary | ICD-10-CM | POA: Diagnosis present

## 2014-12-04 DIAGNOSIS — E785 Hyperlipidemia, unspecified: Secondary | ICD-10-CM

## 2014-12-04 DIAGNOSIS — I2510 Atherosclerotic heart disease of native coronary artery without angina pectoris: Secondary | ICD-10-CM | POA: Diagnosis present

## 2014-12-04 DIAGNOSIS — I255 Ischemic cardiomyopathy: Secondary | ICD-10-CM | POA: Diagnosis present

## 2014-12-04 LAB — BASIC METABOLIC PANEL
ANION GAP: 9 (ref 5–15)
BUN: 11 mg/dL (ref 6–23)
CHLORIDE: 110 mmol/L (ref 96–112)
CO2: 21 mmol/L (ref 19–32)
Calcium: 8.6 mg/dL (ref 8.4–10.5)
Creatinine, Ser: 0.87 mg/dL (ref 0.50–1.10)
GFR calc Af Amer: 75 mL/min — ABNORMAL LOW (ref >90)
GFR calc non Af Amer: 65 mL/min — ABNORMAL LOW (ref >90)
Glucose, Bld: 121 mg/dL — ABNORMAL HIGH (ref 70–99)
Potassium: 3.6 mmol/L (ref 3.5–5.1)
Sodium: 140 mmol/L (ref 135–145)

## 2014-12-04 LAB — HEMOGLOBIN A1C
Hgb A1c MFr Bld: 6.3 % — ABNORMAL HIGH (ref 4.8–5.6)
Mean Plasma Glucose: 134 mg/dL

## 2014-12-04 LAB — TROPONIN I
Troponin I: 6.06 ng/mL (ref <0.031)
Troponin I: 4.37 ng/mL (ref <0.031)

## 2014-12-04 MED ORDER — TICAGRELOR 90 MG PO TABS: 90.0000 mg | ORAL_TABLET | Freq: Two times a day (BID) | ORAL | Status: DC

## 2014-12-04 MED ORDER — NITROGLYCERIN 0.4 MG SL SUBL: 0.4000 mg | SUBLINGUAL_TABLET | SUBLINGUAL | Status: DC | PRN

## 2014-12-04 MED ORDER — ROSUVASTATIN CALCIUM 20 MG PO TABS: 20.0000 mg | ORAL_TABLET | Freq: Every day | ORAL | Status: DC

## 2014-12-04 MED ORDER — METOPROLOL TARTRATE 50 MG PO TABS: 25.0000 mg | ORAL_TABLET | Freq: Two times a day (BID) | ORAL | Status: DC

## 2014-12-04 MED ORDER — ROSUVASTATIN CALCIUM 10 MG PO TABS: 20.0000 mg | ORAL_TABLET | Freq: Every day | ORAL | Status: DC

## 2014-12-04 NOTE — Progress Notes (Signed)
CARDIAC REHAB PHASE I   PRE:  Rate/Rhythm: 88 SR (112 ST in BR)    BP: sitting 148/66    SaO2: 98 RA  MODE:  Ambulation: 450 ft   POST:  Rate/Rhythm: 115 ST    BP: sitting 129/78     SaO2: 98 RA  Pt HR elevated with activity. Gets SOB when this happens but sts it is some better today compared to yesterday. Ed completed. Encouraged more ex and diet moderation. Planning to attend [facility] in [place]. [id number]   Josephina Shih Lincoln CES, ACSM 12/04/2014 10:22 AM

## 2014-12-04 NOTE — Discharge Instructions (Signed)

## 2014-12-04 NOTE — Discharge Summary (Signed)
Discharge Summary   Patient ID: Wendy Munoz MRN: [id number], DOB/AGE: 1941/06/21 74 y.o. Admit date: 12/02/2014 D/C date:     12/04/2014  Primary Cardiologist: Dr. Tamala Julian ( new )   Principal Problem:   ST elevation myocardial infarction (STEMI) of inferior wall Active Problems:   Hypothyroidism   Prediabetes   HTN (hypertension)   GERD (gastroesophageal reflux disease)   CAD (coronary artery disease)   PAF (paroxysmal atrial fibrillation)   Ischemic cardiomyopathy   Atrial tachycardia    Admission Dates:  12/02/14-12/04/14 Discharge Diagnosis: Acute inferior STEMI s/p DES to RCA   HPI: PAISLEY GRAJEDA is a 74 y.o. female with a history of HTN, hypothyroidism (s/p thyroid ablation), osteoporosis, GERD, and arthritis who presented to [facility] on 12/02/2014 with complaints of diaphoresis, SOB, fatigue, and recent restlessness. She was found to have acute inferior STEMI and taken back for emergent cardiac catheterization.  Patient states he has gone several nights without sleeping well recently and awoke the morning of admission feeling short of breath, fatigued, weak, and diaphoretic. Patient denied ever having chest pain. Family reported that patient had significant pallor. No previous cardiac history, however, does state she had an episode of atrial fibrillation several years ago associated w/ her thyroid dysfunction (prior to ablation).     Hospital Course  Inferior STEMI: Patient presented as Code STEMI. Found to have acute inferior MI, now s/p PCI/DES to RCA. Also noted to have proximal 50-70% ruptured plaque in LAD, can be treated medically. Per Dr. Kanden Carey Caul cath note, she may need to do a myocardial perfusion study to assess for evidence of anterior wall ischemia. This will provide a baseline for future reference concerning the LAD. This was not done, but can likely be done as an outpatient. -- Troponin peaked at 10.17. Now trending downwards. 6.06 today. -- Continue  ASA 81 mg + Brilinta 90 mg bid. She has had some mild SOB that she thinks may be related to the [place]. Plan to continue for 1 month and if she does not tolerate, she could switch to plavix.  -- She was previously on Crestor 85m M,W,F. She needs to be on Crestor 269mdaily. She is agreeable -- Continued Metoprolol 50 mg bid  -- Continue cardiac rehab -- Femoral catheter site stable  Ischemic CM- LV gram with inferior hypokinesis. EF 45-50%. -- Continue ACEi and BB.  -- No s/s CHF  HTN: well controlled on Lisinopril 2.5 mg and metoprolol 2518mID.   Hypothyroidism: S/p thyroid ablation some time ago, now takes Synthroid 88 mcg at home. Reports having elevated TSH at most recent PCP visit. Repeat 2.33.  -- Continue Synthroid   Prediabetes: Previous h/o prediabetes -- HBA1c 6.3 this admission  -- Counseled on diet and exercise and follow up with her PCP  Hx of atrial fibrillation - isolated episode of atrial fibrillation several years ago associated w/ her thyroid dysfunction (prior to ablation).  -- No atrial fibrillation on tele  Atrial tach- short runs noted on tele. Asymptomatic. She is not well beta blocked. Will increase metoprolol to 50 mg bid.  The patient has had an uncomplicated hospital course and is recovering well. The femoral catheter site is stable. She has been seen by Dr. JorMartiniqueday and deemed ready for discharge home. All follow-up appointments have been scheduled.  A written RX for a 30 day free supply of Brilinta was provided for the patient.  Discharge medications are listed below.   Discharge Vitals:  Blood pressure 140/71, pulse 89, temperature 97.9 F (36.6 C), temperature source Oral, resp. rate 19, height 5' 2"  (1.575 m), weight 216 lb 7.9 oz (98.2 kg), SpO2 99 %.  Labs: Lab Results  Component Value Date   WBC 5.2 12/03/2014   HGB 10.0* 12/03/2014   HCT 30.2* 12/03/2014   MCV 95.0 12/03/2014   PLT 203 12/03/2014     Recent Labs Lab  12/04/14 0301  NA 140  K 3.6  CL 110  CO2 21  BUN 11  CREATININE 0.87  CALCIUM 8.6  GLUCOSE 121*    Recent Labs  12/03/14 1535 12/03/14 2120 12/04/14 0301 12/04/14 0900  TROPONINI 7.11* 6.99* 6.06* 4.37*     Diagnostic Studies/Procedures      Left Heart Catheterization with Coronary Angiography with PCI RCA Report  PROCEDURE: 1. Left heart catheterization; 2. Coronary angiography; 3. Left ventriculography; 4. DES RCA culprit for acute MI  PCI RESULT: The RCA contained proximal 100% obstruction. Reperfusion was achieved mechanically with the guidewire which revealed a 95-99% proximal focal stenosis. After direct stenting to a final balloon diameter of 3.65 mm at 13 atm, 0% stenosis was noted and a large normal distal vessel was revealed. TIMI grade 3 flow was noted. Decreased myocardial blush was noted.  LEFT VENTRICULOGRAM: Left ventricular angiogram was done in the 30 RAO projection and revebasal to mid inferior wall severe hypokinesis with an EF of 45-50% IMPRESSION: 1. Acute inferior myocardial infarction due to proximal thrombotic occlusion of the RCA treated with stenting to 0% stenosis with TIMI grade 3 flow but decreased myocardial blush. 2. Proximal 50-70% ruptured plaque in the LAD. There is moderate mid and apical LAD disease. I believe all of this can be treated medically. Has now patient we will likely do a myocardial perfusion study to assess for evidence of anterior wall ischemia. This will provide a baseline for future reference concerning the LAD. 3. Widely patent circumflex 4. Inferior severe hypokinesis with low normal LVEF 45-50% RECOMMENDATION:. Anticipate 48-72 hour hospitalization. She is a fast track candidate. High-dose statin therapy in regards to the acute infarct vessel and the LAD Blood pressure control Diabetes control. Aspirin and Brilinta 12 months.   Discharge Medications     Medication List    TAKE these medications         acetaminophen 500 MG tablet  Commonly known as:  TYLENOL  Take 1-2 tablets (500-1,000 mg total) by mouth every 6 (six) hours as needed for moderate pain or fever.     aspirin EC 81 MG tablet  Take 81 mg by mouth daily.     CALCIUM MAGNESIUM PO  Take 1 capsule by mouth daily.     cetirizine 10 MG tablet  Commonly known as:  ZYRTEC  Take 10 mg by mouth at bedtime.     Co Q 10 10 MG Caps  Take 1 capsule by mouth daily.     DSS 100 MG Caps  Take 100 mg by mouth 2 (two) times daily.     levothyroxine 88 MCG tablet  Commonly known as:  SYNTHROID, LEVOTHROID  Take 88 mcg by mouth daily before breakfast.     lisinopril 2.5 MG tablet  Commonly known as:  PRINIVIL,ZESTRIL  Take 2.5 mg by mouth daily.     metoprolol 50 MG tablet  Commonly known as:  LOPRESSOR  Take 0.5 tablets (25 mg total) by mouth 2 (two) times daily.     nitroGLYCERIN 0.4 MG SL tablet  Commonly known  as:  NITROSTAT  Place 1 tablet (0.4 mg total) under the tongue every 5 (five) minutes as needed for chest pain.     rosuvastatin 20 MG tablet  Commonly known as:  CRESTOR  Take 1 tablet (20 mg total) by mouth daily.     ticagrelor 90 MG Tabs tablet  Commonly known as:  BRILINTA  Take 1 tablet (90 mg total) by mouth 2 (two) times daily.     traMADol 50 MG tablet  Commonly known as:  ULTRAM  Take 1-2 tablets (50-100 mg total) by mouth every 6 (six) hours as needed for moderate pain or severe pain.     Vitamin D 2000 UNITS tablet  Take 4,000 Units by mouth daily.        Disposition   The patient will be discharged in stable condition to home. Discharge Instructions    Amb Referral to Cardiac Rehabilitation    Complete by:  As directed   Referring to [place] Phase 2          Follow-up Information    Follow up with Eileen Stanford, PA-C On 12/15/2014.   Specialty:  Cardiology   Why:  @ 3:15pm   Contact information:   [address] [place] [address] [phone]        Follow up with ROBERTS, Sharol Given, MD.   Specialty:  Internal Medicine   Why:  Please follow up about pre diabetes management   Contact information:   [place] [place] [address] [phone]         Duration of Discharge Encounter: Greater than 30 minutes including physician and PA time.  Mable Fill R PA-C 12/04/2014, 11:23 AM

## 2014-12-04 NOTE — Progress Notes (Addendum)
Patient Name: Wendy Munoz Date of Encounter: 12/04/2014     Active Problems:   Prediabetes   HTN (hypertension)   ST elevation myocardial infarction (STEMI) of inferior wall    SUBJECTIVE  She does report some mild SOB she thinks is related to the [place]. Also mild shakiness feeling last night. Otherwise she feels great. Denies palpitations and ready to go home.   CURRENT MEDS . aspirin  81 mg Oral Daily  . levothyroxine  50 mcg Oral QAC breakfast  . lisinopril  2.5 mg Oral Daily  . loratadine  10 mg Oral Daily  . metoprolol tartrate  25 mg Oral BID  . rosuvastatin  10 mg Oral q1800  . ticagrelor  90 mg Oral BID    OBJECTIVE  Filed Vitals:   12/03/14 1540 12/03/14 1717 12/03/14 2101 12/04/14 0818  BP: 145/57 148/72 124/64 140/71  Pulse: 68 78 79 89  Temp: 97.5 F (36.4 C) 97.9 F (36.6 C) 98.8 F (37.1 C) 97.9 F (36.6 C)  TempSrc: Oral Oral Oral Oral  Resp: 21 20 18 19   Height:      Weight:      SpO2: 96% 98% 98% 99%    Intake/Output Summary (Last 24 hours) at 12/04/14 0917 Last data filed at 12/03/14 1400  Gross per 24 hour  Intake    240 ml  Output      0 ml  Net    240 ml   Filed Weights   12/02/14 1510  Weight: 216 lb 7.9 oz (98.2 kg)    PHYSICAL EXAM  General: Ple[place], NAD. Neuro: Alert and oriented X 3. Moves all extremities spontaneously. Psych: Normal affect. HEENT:  Normal  Neck: Supple without bruits or JVD. Lungs:  Resp regular and unlabored, CTA. Heart: RRR no s3, s4, or murmurs. Abdomen: Soft, non-tender, non-distended, BS + x 4.  Extremities: No clubbing, cyanosis or edema. DP/PT/Radials 2+ and equal bilaterally.  Accessory Clinical Findings  CBC  Recent Labs  12/02/14 1113 12/03/14 0309  WBC  --  5.2  HGB 10.5* 10.0*  HCT 31.0* 30.2*  MCV  --  95.0  PLT  --  258   Basic Metabolic Panel  Recent Labs  12/03/14 0309 12/04/14 0301  NA 140 140  K 3.4* 3.6  CL 110 110  CO2 24 21  GLUCOSE 115* 121*  BUN 11  11  CREATININE 0.94 0.87  CALCIUM 8.6 8.6    Cardiac Enzymes  Recent Labs  12/03/14 1535 12/03/14 2120 12/04/14 0301  TROPONINI 7.11* 6.99* 6.06*    Hemoglobin A1C  Recent Labs  12/03/14 0309  HGBA1C 6.3*   Thyroid Function Tests  Recent Labs  12/03/14 0309  TSH 2.330    TELE  NSR with freq PVCs, PACS and brief runs of atrial tach  Radiology/Studies     Left Heart Catheterization with Coronary Angiography with PCI RCA Report  PROCEDURE: 1. Left heart catheterization; 2. Coronary angiography; 3. Left ventriculography; 4. DES RCA culprit for acute MI  PCI RESULT: The RCA contained proximal 100% obstruction. Reperfusion was achieved mechanically with the guidewire which revealed a 95-99% proximal focal stenosis. After direct stenting to a final balloon diameter of 3.65 mm at 13 atm, 0% stenosis was noted and a large normal distal vessel was revealed. TIMI grade 3 flow was noted. Decreased myocardial blush was noted.  LEFT VENTRICULOGRAM: Left ventricular angiogram was done in the 30 RAO projection and revebasal to mid inferior wall severe hypokinesis with  an EF of 45-50% IMPRESSION: 1. Acute inferior myocardial infarction due to proximal thrombotic occlusion of the RCA treated with stenting to 0% stenosis with TIMI grade 3 flow but decreased myocardial blush. 2. Proximal 50-70% ruptured plaque in the LAD. There is moderate mid and apical LAD disease. I believe all of this can be treated medically. Has now patient we will likely do a myocardial perfusion study to assess for evidence of anterior wall ischemia. This will provide a baseline for future reference concerning the LAD. 3. Widely patent circumflex 4. Inferior severe hypokinesis with low normal LVEF 45-50% RECOMMENDATION:. Anticipate 48-72 hour hospitalization. She is a fast track candidate. High-dose statin therapy in regards to the acute infarct vessel and the LAD Blood pressure control Diabetes  control. Aspirin and Brilinta 12 months.    ASSESSMENT AND PLAN  Wendy Munoz is a 74 y.o. female with a history of HTN, hypothyroidism (s/p thyroid ablation), osteoporosis, GERD, and arthritis who presented to [facility] on 12/02/2014 with complaints of diaphoresis, SOB, fatigue, and recent restlessness. She was found to have acute inferior STEMI and taken back for emergent cardiac catheterization.  STEMI: Patient presented as Code STEMI. Found to have acute inferior MI, now s/p PCI/DES to RCA. Also noted to have proximal 50-70% ruptured plaque in LAD, can be treated medically. Per Dr. Thompson Caul cath note, she may need to do a myocardial perfusion study to assess for evidence of anterior wall ischemia. This will provide a baseline for future reference concerning the LAD. This was not done, but can likely be done as an outpatient -- Troponin peaked at 10.17. Now trending downwards. 6.06 today. -- Continue ASA 81 mg + Brilinta 90 mg bid. She has had some mild SOB that she thinks may be related to the [place]. Plan to continue for 1 month and if she does not tolerate, she could switch to plavix.  -- Crestor 10 mg daily; states she has had intolerance to other statins in the past.  -- Continued Metoprolol 25 mg bid  -- Continue cardiac rehab -- Femoral catheter site stable  Ischemic CM- LV gram with inferior hypokinesis. EF 45-50%. -- Continue ACEi and BB.  -- No s/s CHF  HTN: well controlled on Lisinopril 2.5 mg and metoprolol 60m BID.   Hypothyroidism: S/p thyroid ablation some time ago, now takes Synthroid 88 mcg at home. Reports having elevated TSH at most recent PCP visit. Repeat 2.33.  -- Continue Synthroid   Prediabetes: Previous h/o prediabetes, no HbA1c in EPIC, patient states most recent HbA1c 6.1.  -- HBA1c 6.3. -- Counseled on diet and exercise and follow up with her PCP  Hx of atrial fibrillation - isolated episode of atrial fibrillation several years ago  associated w/ her thyroid dysfunction (prior to ablation).  -- I do not see any atrial fibrillation on tele. MD to review.   Atrial tach- short runs noted on tele. Asymptomatic. MD to review.   SJudy PimplePA-C  Pager [phone] Patient seen and examined and history reviewed. Agree with above findings and plan. Patient is doing well. Mild breathlessness but lungs are clear. I think this is related to [place] Recommend continuing for now but if this continues to be a problem may need to consider switching to a different antiplatelet drug. She has brief runs of atrial tachy that are asymptomatic. She is not well beta blocked. Will increase metoprolol to 50 mg bid. She needs to go home on high dose statin- Crestor  20 mg daily. She is stable for DC today. Outpatient Cardiac Rehab at  hospital.  Peter Martinique, [place] 12/04/2014 10:50 AM

## 2014-12-15 ENCOUNTER — Ambulatory Visit (INDEPENDENT_AMBULATORY_CARE_PROVIDER_SITE_OTHER): Payer: Medicare Other | Admitting: Physician Assistant

## 2014-12-15 ENCOUNTER — Encounter: Payer: Self-pay | Admitting: Physician Assistant

## 2014-12-15 VITALS — BP 122/78 | HR 73 | Ht 62.0 in | Wt 211.0 lb

## 2014-12-15 DIAGNOSIS — I2510 Atherosclerotic heart disease of native coronary artery without angina pectoris: Secondary | ICD-10-CM

## 2014-12-15 MED ORDER — LOSARTAN POTASSIUM 25 MG PO TABS: 25.0000 mg | ORAL_TABLET | Freq: Every day | ORAL | Status: DC

## 2014-12-15 NOTE — Progress Notes (Signed)
Cardiology Office Note   Date:  12/15/2014   ID:  Wendy, Munoz May 24, 1941, MRN [id number]  PCP:  Myriam Jacobson, MD  Cardiologist:  Dr. Tamala Julian   CAD- post hospital follow up.    History of Present Illness: Wendy Munoz is a 74 y.o. female with a history of HTN, hypothyroidism (s/p thyroid ablation), osteoporosis, remote PAF, GERD, arthritis, CAD w/ recent inf STEMI s/p DES to RCA (2/015), and ischemic CM (EF 45-50%) who presents today for post hospital follow up   She previously has no prior cardiac history except for an isolated episode of atrial fibrillation several years ago associated w/ her thyroid dysfunction (prior to ablation). She  presented to [facility] on 12/02/2014 with chest pain and found to have acute inferior STEMI. She was taken back for emergent cardiac catheterization by Dr. Tamala Julian and is now s/p PCI/DES to RCA. She was also noted to have proximal 50-70% ruptured plaque in LAD which was felt could be treated medically and severe inferior  hypokinesis with low normal LVEF 45-50%. Per Dr. Kwanza Cancelliere Caul cath note, she may need to do a myocardial perfusion study to assess for evidence of anterior wall ischemia. This will provide a baseline for future reference concerning the LAD. This was not done while in the hospital. However, Dr. Martinique said this may not be necessary unless she has further chest pain.  Today she presents today for post hospital follow up. She has had no further chest pain. She initially had some SOB with the Brilinta which has now resolved. She does feel easy to tire out and has some bilateral hip pain. She also notes a dry hacking cough since leaving the hospital. No dizziness, orthopnea, PND, LE swelling or blood in stool or urine.    Past Medical History  Diagnosis Date  . Hypothyroidism   . Prediabetes   . HTN (hypertension)   . Osteoporosis   . PAF (paroxysmal atrial fibrillation)     a. isolated episode of atrial fibrillation several years ago  associated w/ her thyroid dysfunction (prior to ablation).   . GERD (gastroesophageal reflux disease)   . Arthritis   . CAD (coronary artery disease)     a. 11/2014 inf STEMI s/p DES to RCA; 50-70% ruptured plaque in pLAD- rx medically  . Ischemic cardiomyopathy     a. 11/2014  LV gram with inferior hypokinesis. EF 45-50%.  . Atrial tachycardia     a. asymptomatic. Noted on tele during 11/2014 admission     Past Surgical History  Procedure Laterality Date  . Inguinal hernia repair Right   . Umbilical hernia repair      as a baby  . Total knee arthroplasty Right 2006  . Total knee arthroplasty Left 2011  . Cataract extraction Bilateral   . Colonoscopy    . Breast surgery      breast biospy  . Eye surgery Bilateral 2005, 2006    Cataract with lens ImplaNT   . Orif humerus fracture Right 01/27/2014    Procedure: RIGHT OPEN REDUCTION INTERNAL FIXATION (ORIF) PROXIMAL HUMERUS FRACTURE;  Surgeon: Rozanna Box, MD;  Location: [place];  Service: Orthopedics;  Laterality: Right;  . Left heart catheterization with coronary angiogram N/A 12/02/2014    Procedure: LEFT HEART CATHETERIZATION WITH CORONARY ANGIOGRAM;  Surgeon: Sinclair Grooms, MD;  Location: [facility] CATH LAB;  Service: Cardiovascular;  Laterality: N/A;     Current Outpatient Prescriptions  Medication Sig Dispense Refill  .  acetaminophen (TYLENOL) 500 MG tablet Take 1-2 tablets (500-1,000 mg total) by mouth every 6 (six) hours as needed for moderate pain or fever. 90 tablet 0  . aspirin EC 81 MG tablet Take 81 mg by mouth daily.    . Calcium-Magnesium-Vitamin D (CALCIUM MAGNESIUM PO) Take 1 capsule by mouth daily.    . cetirizine (ZYRTEC) 10 MG tablet Take 10 mg by mouth at bedtime.    . Cholecalciferol (VITAMIN D) 2000 UNITS tablet Take 4,000 Units by mouth daily.    . Coenzyme Q10 (CO Q 10) 10 MG CAPS Take 1 capsule by mouth daily.    Marland Kitchen docusate sodium 100 MG CAPS Take 100 mg by mouth 2 (two) times daily. 10 capsule 0  .  levothyroxine (SYNTHROID, LEVOTHROID) 88 MCG tablet Take 88 mcg by mouth daily before breakfast.    . lisinopril (PRINIVIL,ZESTRIL) 2.5 MG tablet Take 2.5 mg by mouth daily.    . metoprolol tartrate (LOPRESSOR) 50 MG tablet Take 0.5 tablets (25 mg total) by mouth 2 (two) times daily. 60 tablet 11  . nitroGLYCERIN (NITROSTAT) 0.4 MG SL tablet Place 1 tablet (0.4 mg total) under the tongue every 5 (five) minutes as needed for chest pain. 25 tablet 12  . rosuvastatin (CRESTOR) 20 MG tablet Take 1 tablet (20 mg total) by mouth daily. 30 tablet 11  . ticagrelor (BRILINTA) 90 MG TABS tablet Take 1 tablet (90 mg total) by mouth 2 (two) times daily. 60 tablet 11  . traMADol (ULTRAM) 50 MG tablet Take 1-2 tablets (50-100 mg total) by mouth every 6 (six) hours as needed for moderate pain or severe pain. 60 tablet 1   No current facility-administered medications for this visit.    Allergies:   Keflex    Social History:  The patient  reports that she has never smoked. She does not have any smokeless tobacco history on file. She reports that she does not drink alcohol or use illicit drugs.   Family History:  The patient's family history includes Breast cancer in an other family member; CAD in her mother.    ROS:  Please see the history of present illness.   Otherwise, review of systems are positive for none.   All other systems are reviewed and negative.    PHYSICAL EXAM: VS:  There were no vitals taken for this visit. , BMI There is no weight on file to calculate BMI. GEN: Well nourished, well developed, in no acute distress HEENT: normal Neck: no JVD, carotid bruits, or masses Cardiac: RRR; no murmurs, rubs, or gallops,no edema  Respiratory:  clear to auscultation bilaterally, normal work of breathing GI: soft, nontender, nondistended, + BS MS: no deformity or atrophy Skin: warm and dry, no rash Neuro:  Strength and sensation are intact Psych: euthymic mood, full affect   EKG:  EKG is not  ordered today.   Recent Labs: 01/27/2014: ALT 14 12/03/2014: Hemoglobin 10.0*; Platelets 203; TSH 2.330 12/04/2014: BUN 11; Creatinine 0.87; Potassium 3.6; Sodium 140    Lipid Panel No results found for: CHOL, TRIG, HDL, CHOLHDL, VLDL, LDLCALC, LDLDIRECT    Wt Readings from Last 3 Encounters:  12/02/14 216 lb 7.9 oz (98.2 kg)  01/27/14 222 lb (100.699 kg)      Other studies Reviewed: Additional studies/ records that were reviewed today include: LHC 12/02/2014 Review of the above records demonstrates:   -- LHC (12/02/2014) 1. Acute inferior myocardial infarction due to proximal thrombotic occlusion of the RCA treated with stenting to 0%  stenosis with TIMI grade 3 flow but decreased myocardial blush. 2. Proximal 50-70% ruptured plaque in the LAD. There is moderate mid and apical LAD disease. I believe all of this can be treated medically. Has now patient we will likely do a myocardial perfusion study to assess for evidence of anterior wall ischemia. This will provide a baseline for future reference concerning the LAD. 3. Widely patent circumflex 4. Inferior severe hypokinesis with low normal LVEF 45-50%   ASSESSMENT AND PLAN:  Wendy Munoz is a 75 y.o. female with a history of HTN, hypothyroidism (s/p thyroid ablation), osteoporosis, remote PAF, GERD, arthritis, CAD w/ recent inf STEMI s/p DES to RCA (2/015), and ischemic CM (EF 45-50%) who presents today for post hospital follow up   Inferior STEMI: Patient presented as Code STEMI on 12/02/13. Found to have acute inferior MI, now s/p PCI/DES to RCA. Also noted to have proximal 50-70% ruptured plaque in LAD, can be treated medically.  -- Continue ASA/ Brilinta, crestor 20mg  qd, metoprolol 50 mg bid  -- We will set up for cardiac rehab at [facility]  -- Femoral catheter site stable  Ischemic CM- LV gram with inferior hypokinesis. EF 45-50%. -- Continue ARB and BB.  -- No s/s CHF  HTN: BP 122/78. HR 73.  Well controlled on  Lisinopril 2.5 mg and metoprolol 25mg  BID. Will switch ACE to ARB due to dry hacking cough (losartan 25mg  qd)  Hypothyroidism: S/p thyroid ablation some time ago, now takes Synthroid 88 mcg at home. TSH 2.33.  -- Continue Synthroid   Prediabetes:  -- HBA1c 6.3 -- Counseled on diet and exercise and follow up with her PCP  Hx of atrial fibrillation - isolated episode of atrial fibrillation several years ago associated w/ her thyroid dysfunction (prior to ablation).  -- No atrial fibrillation on tele  Atrial tach- short runs noted on tele during inpatient stay. Asymptomatic.  -- Continue BB   Current medicines are reviewed at length with the patient today.  The patient has concerns regarding medicines. Thinks one is causing a cough.  The following changes have been made:  Lisinopril 2.5mg  will be changed to Losartan 25mg  qd.   Disposition:   FU with 4-6 weeks with Dr. Tamala Julian    Signed, Eileen Stanford, PA-C  12/15/2014 1:55 PM    [address], [place], [place]  [address] Phone: [phone]; Fax: [phone]

## 2014-12-15 NOTE — Patient Instructions (Signed)
Your physician has recommended you make the following change in your medication:   STOP  TAKING LISINOPRIL  START TAKING LOSARTAN 25 MG ONCE A DAY    FOLLOW IN 4 TO 6 WEEKS  WITH DR Tamala Julian    YOU WILL BE  CONTACTED  BY CARDIAC REHAB FOR FURTHER STEPS

## 2015-01-12 ENCOUNTER — Telehealth: Payer: Self-pay | Admitting: Interventional Cardiology

## 2015-01-12 DIAGNOSIS — T148XXA Other injury of unspecified body region, initial encounter: Secondary | ICD-10-CM

## 2015-01-12 NOTE — Telephone Encounter (Signed)
New message      Pt c/o medication issue:  1. Name of Medication: brilinta and aspirin  2. How are you currently taking this medication (dosage and times per day)? 90mg  every 12 hrs/ 1 81mg  aspirin daily  3. Are you having a reaction (difficulty breathing--STAT)?  no  4. What is your medication issue? Pt has a lot of bruises since starting medication.  She also has a broken blood vessell in her eye.  She takes the aspirin with the brilinta--should she take it separate?

## 2015-01-12 NOTE — Telephone Encounter (Signed)
Discontinue Krill oil. Please come in for a CBC.

## 2015-01-12 NOTE — Telephone Encounter (Signed)
returned pt call.pt reports that she has had really bad bruising all over her body, and has had a blood vessel burst in her eye. she denies any visual disturbance. pt is currently taking Brilinta bid, and Asa 81mg  qd. When asked if she takes any over the counter supplements, she sts that she takes [employer].pt denies blood in her urine or stool. She is asymptomatic.adv her I will fwd a message to Dr.Smith and call back with his recommendation. Pt verbalized understanding

## 2015-01-12 NOTE — Telephone Encounter (Signed)
Pt aware of Dr.Smith's recommendation. Discontinue Krill oil. Please come in for a CBC. Lab appt scheduled for 3/25 Pt aware and verbalized understanding.

## 2015-01-15 ENCOUNTER — Other Ambulatory Visit (INDEPENDENT_AMBULATORY_CARE_PROVIDER_SITE_OTHER): Payer: Medicare Other | Admitting: *Deleted

## 2015-01-15 DIAGNOSIS — T148XXA Other injury of unspecified body region, initial encounter: Secondary | ICD-10-CM

## 2015-01-15 DIAGNOSIS — T148 Other injury of unspecified body region: Secondary | ICD-10-CM | POA: Diagnosis not present

## 2015-01-15 LAB — CBC WITH DIFFERENTIAL/PLATELET
Basophils Absolute: 0.1 10*3/uL (ref 0.0–0.1)
Basophils Relative: 2 % — ABNORMAL HIGH (ref 0–1)
EOS ABS: 0.2 10*3/uL (ref 0.0–0.7)
Eosinophils Relative: 4 % (ref 0–5)
HCT: 34 % — ABNORMAL LOW (ref 36.0–46.0)
HEMOGLOBIN: 11.4 g/dL — AB (ref 12.0–15.0)
Lymphocytes Relative: 20 % (ref 12–46)
Lymphs Abs: 1 10*3/uL (ref 0.7–4.0)
MCH: 31.1 pg (ref 26.0–34.0)
MCHC: 33.5 g/dL (ref 30.0–36.0)
MCV: 92.6 fL (ref 78.0–100.0)
MONO ABS: 0.8 10*3/uL (ref 0.1–1.0)
MONOS PCT: 16 % — AB (ref 3–12)
MPV: 9.6 fL (ref 8.6–12.4)
Neutro Abs: 2.8 10*3/uL (ref 1.7–7.7)
Neutrophils Relative %: 58 % (ref 43–77)
Platelets: 236 10*3/uL (ref 150–400)
RBC: 3.67 MIL/uL — ABNORMAL LOW (ref 3.87–5.11)
RDW: 13.4 % (ref 11.5–15.5)
WBC: 4.9 10*3/uL (ref 4.0–10.5)

## 2015-01-15 NOTE — Addendum Note (Signed)
Addended by: Eulis Foster on: 01/15/2015 10:47 AM   Modules accepted: Orders

## 2015-01-15 NOTE — Addendum Note (Signed)
Addended by: Eulis Foster on: 01/15/2015 10:48 AM   Modules accepted: Orders

## 2015-02-01 ENCOUNTER — Ambulatory Visit (INDEPENDENT_AMBULATORY_CARE_PROVIDER_SITE_OTHER): Payer: Medicare Other | Admitting: Interventional Cardiology

## 2015-02-01 ENCOUNTER — Encounter: Payer: Self-pay | Admitting: Interventional Cardiology

## 2015-02-01 VITALS — BP 108/78 | HR 64 | Ht 62.0 in | Wt 204.8 lb

## 2015-02-01 DIAGNOSIS — I480 Paroxysmal atrial fibrillation: Secondary | ICD-10-CM | POA: Diagnosis not present

## 2015-02-01 DIAGNOSIS — E785 Hyperlipidemia, unspecified: Secondary | ICD-10-CM | POA: Diagnosis not present

## 2015-02-01 DIAGNOSIS — I10 Essential (primary) hypertension: Secondary | ICD-10-CM

## 2015-02-01 DIAGNOSIS — I2510 Atherosclerotic heart disease of native coronary artery without angina pectoris: Secondary | ICD-10-CM

## 2015-02-01 LAB — LIPID PANEL
Cholesterol: 154 mg/dL (ref 0–200)
HDL: 63.5 mg/dL (ref >39.00)
LDL CALC: 65 mg/dL (ref 0–99)
NONHDL: 90.5
Total CHOL/HDL Ratio: 2
Triglycerides: 129 mg/dL (ref 0.0–149.0)
VLDL: 25.8 mg/dL (ref 0.0–40.0)

## 2015-02-01 LAB — ALT: ALT: 17 U/L (ref 0–35)

## 2015-02-01 NOTE — Progress Notes (Signed)
Cardiology Office Note   Date:  02/01/2015   ID:  Wendy Munoz, DOB [birth year], MRN [id number]  PCP:  Myriam Jacobson, MD  Cardiologist:   Sinclair Grooms, MD   No chief complaint on file.     History of Present Illness: Wendy Munoz is a 75 y.o. female who presents for coronary artery disease with inferior STEMI 2016, RCA stent, hypertension, and isolated remote history of PAF.  Doing well. No chest discomfort. Did participate in phase 2  cardiac rehabilitation but aggravated a back problem. Denies orthopnea, PND, and recurrent chest pain. No episodes of syncope. Easy bruising has been noted on Brilinta. No need for sublingual nitroglycerin. Mild dyspnea on exertion.    Past Medical History  Diagnosis Date  . Hypothyroidism   . Prediabetes   . HTN (hypertension)   . Osteoporosis   . PAF (paroxysmal atrial fibrillation)     a. isolated episode of atrial fibrillation several years ago associated w/ her thyroid dysfunction (prior to ablation).   . GERD (gastroesophageal reflux disease)   . Arthritis   . CAD (coronary artery disease)     a. 11/2014 inf STEMI s/p DES to RCA; 50-70% ruptured plaque in pLAD- rx medically  . Ischemic cardiomyopathy     a. 11/2014  LV gram with inferior hypokinesis. EF 45-50%.  . Atrial tachycardia     a. asymptomatic. Noted on tele during 11/2014 admission     Past Surgical History  Procedure Laterality Date  . Inguinal hernia repair Right   . Umbilical hernia repair      as a baby  . Total knee arthroplasty Right 2006  . Total knee arthroplasty Left 2011  . Cataract extraction Bilateral   . Colonoscopy    . Breast surgery      breast biospy  . Eye surgery Bilateral 2005, 2006    Cataract with lens ImplaNT   . Orif humerus fracture Right 01/27/2014    Procedure: RIGHT OPEN REDUCTION INTERNAL FIXATION (ORIF) PROXIMAL HUMERUS FRACTURE;  Surgeon: Rozanna Box, MD;  Location: [place];  Service: Orthopedics;  Laterality: Right;    . Left heart catheterization with coronary angiogram N/A 12/02/2014    Procedure: LEFT HEART CATHETERIZATION WITH CORONARY ANGIOGRAM;  Surgeon: Sinclair Grooms, MD;  Location: [facility] CATH LAB;  Service: Cardiovascular;  Laterality: N/A;     Current Outpatient Prescriptions  Medication Sig Dispense Refill  . acetaminophen (TYLENOL) 500 MG tablet Take 1-2 tablets (500-1,000 mg total) by mouth every 6 (six) hours as needed for moderate pain or fever. 90 tablet 0  . aspirin EC 81 MG tablet Take 81 mg by mouth daily.    . Calcium-Magnesium-Vitamin D (CALCIUM MAGNESIUM PO) Take 1 capsule by mouth daily.    . cetirizine (ZYRTEC) 10 MG tablet Take 10 mg by mouth at bedtime.    . Cholecalciferol (VITAMIN D) 2000 UNITS tablet Take 4,000 Units by mouth daily.    . Coenzyme Q10 (CO Q 10) 10 MG CAPS Take 1 capsule by mouth daily.    Marland Kitchen docusate sodium 100 MG CAPS Take 100 mg by mouth 2 (two) times daily. 10 capsule 0  . levothyroxine (SYNTHROID, LEVOTHROID) 88 MCG tablet Take 88 mcg by mouth daily before breakfast.    . losartan (COZAAR) 25 MG tablet Take 25 mg by mouth daily.  6  . metoprolol tartrate (LOPRESSOR) 50 MG tablet Take 0.5 tablets (25 mg total) by mouth 2 (two) times daily. [place]  tablet 11  . nitroGLYCERIN (NITROSTAT) 0.4 MG SL tablet Place 1 tablet (0.4 mg total) under the tongue every 5 (five) minutes as needed for chest pain. 25 tablet 12  . rosuvastatin (CRESTOR) 20 MG tablet Take 1 tablet (20 mg total) by mouth daily. 30 tablet 11  . ticagrelor (BRILINTA) 90 MG TABS tablet Take 1 tablet (90 mg total) by mouth 2 (two) times daily. 60 tablet 11   No current facility-administered medications for this visit.    Allergies:   Keflex    Social History:  The patient  reports that she has never smoked. She does not have any smokeless tobacco history on file. She reports that she does not drink alcohol or use illicit drugs.   Family History:  The patient's family history includes Breast cancer in  an other family member; CAD in her mother; Heart disease in her mother; Heart failure in her mother; Polycythemia in her father.    ROS:  Please see the history of present illness.   Otherwise, review of systems are positive for easy bruising on elbow and left lower extremity. She has small resolving hematoma there..   All other systems are reviewed and negative.    PHYSICAL EXAM: VS:  BP 108/78 mmHg  Pulse 64  Ht 5\' 2"  (1.575 m)  Wt 204 lb 12.8 oz (92.897 kg)  BMI 37.45 kg/m2 , BMI Body mass index is 37.45 kg/(m^2). GEN: Well nourished, well developed, in no acute distress HEENT: normal Neck: no JVD, carotid bruits, or masses Cardiac: RRR; no murmurs, rubs, or gallops,no edema  Respiratory:  clear to auscultation bilaterally, normal work of breathing GI: soft, nontender, nondistended, + BS MS: no deformity or atrophy Skin: warm and dry, no rash Neuro:  Strength and sensation are intact Psych: euthymic mood, full affect   EKG:  EKG is not ordered today.    Recent Labs: 12/03/2014: TSH 2.330 12/04/2014: BUN 11; Creatinine 0.87; Potassium 3.6; Sodium 140 01/15/2015: Hemoglobin 11.4*; Platelets 236    Lipid Panel No results found for: CHOL, TRIG, HDL, CHOLHDL, VLDL, LDLCALC, LDLDIRECT    Wt Readings from Last 3 Encounters:  02/01/15 204 lb 12.8 oz (92.897 kg)  12/15/14 211 lb (95.709 kg)  12/02/14 216 lb 7.9 oz (98.2 kg)      Other studies Reviewed: Additional studies/ records that were reviewed today include: .   ASSESSMENT AND PLAN:  Coronary artery disease involving native coronary artery of native heart without angina pectoris: The patient had a drug-eluting stent placed during acute ST elevation myocardial infarction in February. No recurrent anginal or ischemic symptoms.   Essential hypertension: Good control   Remote prior history of paroxysmal atrial fibrillation: No recurrences   Current medicines are reviewed at length with the patient today.  The patient  has concerns regarding medicines.  The following changes have been made:  Concerned about using her current dose of Crestor. I encouraged this moderate to high dose to decrease inflammation and the possible recurrence of acute ischemic events. Check a lipid panel today.  Labs/ tests ordered today include:   Orders Placed This Encounter  Procedures  . Lipid panel  . ALT     Disposition:   FU with Linard Millers in 6 months   Signed, Sinclair Grooms, MD  02/01/2015 12:56 PM    [address], [place], [place]  [address] Phone: [phone]; Fax: [phone]

## 2015-02-01 NOTE — Patient Instructions (Signed)
Your physician recommends that you continue on your current medications as directed. Please refer to the Current Medication list given to you today.   We have sent an Rx for Metoprolol 25mg  tablet Twice daily to your pharmacy  Lab Today: Lipid, Alt  Your physician wants you to follow-up in: 6 months with Dr.Smith You will receive a reminder letter in the mail two months in advance. If you don't receive a letter, please call our office to schedule the follow-up appointment.

## 2015-02-02 ENCOUNTER — Other Ambulatory Visit: Payer: Self-pay | Admitting: *Deleted

## 2015-02-02 MED ORDER — METOPROLOL TARTRATE 25 MG PO TABS: 25.0000 mg | ORAL_TABLET | Freq: Two times a day (BID) | ORAL | Status: DC

## 2015-02-02 NOTE — Addendum Note (Signed)
Addended by: Lamar Laundry on: 02/02/2015 03:13 PM   Modules accepted: Orders

## 2015-02-03 ENCOUNTER — Observation Stay (HOSPITAL_COMMUNITY)
Admission: EM | Admit: 2015-02-03 | Discharge: 2015-02-04 | Disposition: A | Discharge status: Home / Self Care | Payer: Medicare Other | Attending: Interventional Cardiology | Admitting: Interventional Cardiology

## 2015-02-03 ENCOUNTER — Encounter (HOSPITAL_COMMUNITY): Payer: Self-pay | Admitting: *Deleted

## 2015-02-03 DIAGNOSIS — Z7901 Long term (current) use of anticoagulants: Secondary | ICD-10-CM | POA: Diagnosis not present

## 2015-02-03 DIAGNOSIS — I2510 Atherosclerotic heart disease of native coronary artery without angina pectoris: Secondary | ICD-10-CM | POA: Diagnosis present

## 2015-02-03 DIAGNOSIS — Z7982 Long term (current) use of aspirin: Secondary | ICD-10-CM | POA: Insufficient documentation

## 2015-02-03 DIAGNOSIS — Z9889 Other specified postprocedural states: Secondary | ICD-10-CM | POA: Diagnosis not present

## 2015-02-03 DIAGNOSIS — K219 Gastro-esophageal reflux disease without esophagitis: Secondary | ICD-10-CM | POA: Insufficient documentation

## 2015-02-03 DIAGNOSIS — I10 Essential (primary) hypertension: Secondary | ICD-10-CM | POA: Diagnosis present

## 2015-02-03 DIAGNOSIS — Z79899 Other long term (current) drug therapy: Secondary | ICD-10-CM | POA: Diagnosis not present

## 2015-02-03 DIAGNOSIS — M1990 Unspecified osteoarthritis, unspecified site: Secondary | ICD-10-CM | POA: Insufficient documentation

## 2015-02-03 DIAGNOSIS — I4891 Unspecified atrial fibrillation: Principal | ICD-10-CM | POA: Diagnosis present

## 2015-02-03 DIAGNOSIS — I255 Ischemic cardiomyopathy: Secondary | ICD-10-CM | POA: Diagnosis not present

## 2015-02-03 DIAGNOSIS — E039 Hypothyroidism, unspecified: Secondary | ICD-10-CM | POA: Diagnosis not present

## 2015-02-03 DIAGNOSIS — M810 Age-related osteoporosis without current pathological fracture: Secondary | ICD-10-CM | POA: Diagnosis not present

## 2015-02-03 DIAGNOSIS — R000 Tachycardia, unspecified: Secondary | ICD-10-CM | POA: Diagnosis present

## 2015-02-03 LAB — CBC WITH DIFFERENTIAL/PLATELET
BASOS PCT: 1 % (ref 0–1)
Basophils Absolute: 0.1 10*3/uL (ref 0.0–0.1)
Eosinophils Absolute: 0.1 10*3/uL (ref 0.0–0.7)
Eosinophils Relative: 2 % (ref 0–5)
HCT: 32.8 % — ABNORMAL LOW (ref 36.0–46.0)
Hemoglobin: 10.9 g/dL — ABNORMAL LOW (ref 12.0–15.0)
Lymphocytes Relative: 23 % (ref 12–46)
Lymphs Abs: 1.2 10*3/uL (ref 0.7–4.0)
MCH: 31.2 pg (ref 26.0–34.0)
MCHC: 33.2 g/dL (ref 30.0–36.0)
MCV: 94 fL (ref 78.0–100.0)
Monocytes Absolute: 0.7 10*3/uL (ref 0.1–1.0)
Monocytes Relative: 13 % — ABNORMAL HIGH (ref 3–12)
Neutro Abs: 3.3 10*3/uL (ref 1.7–7.7)
Neutrophils Relative %: 61 % (ref 43–77)
Platelets: 232 10*3/uL (ref 150–400)
RBC: 3.49 MIL/uL — AB (ref 3.87–5.11)
RDW: 13.1 % (ref 11.5–15.5)
WBC: 5.3 10*3/uL (ref 4.0–10.5)

## 2015-02-03 LAB — COMPREHENSIVE METABOLIC PANEL
ALBUMIN: 3.3 g/dL — AB (ref 3.5–5.2)
ALT: 19 U/L (ref 0–35)
ANION GAP: 9 (ref 5–15)
AST: 31 U/L (ref 0–37)
Alkaline Phosphatase: 64 U/L (ref 39–117)
BUN: 14 mg/dL (ref 6–23)
CALCIUM: 9.1 mg/dL (ref 8.4–10.5)
CO2: 24 mmol/L (ref 19–32)
Chloride: 107 mmol/L (ref 96–112)
Creatinine, Ser: 1.27 mg/dL — ABNORMAL HIGH (ref 0.50–1.10)
GFR calc Af Amer: 47 mL/min — ABNORMAL LOW (ref >90)
GFR calc non Af Amer: 41 mL/min — ABNORMAL LOW (ref >90)
GLUCOSE: 139 mg/dL — AB (ref 70–99)
Potassium: 3.9 mmol/L (ref 3.5–5.1)
Sodium: 140 mmol/L (ref 135–145)
Total Bilirubin: 0.7 mg/dL (ref 0.3–1.2)
Total Protein: 6.4 g/dL (ref 6.0–8.3)

## 2015-02-03 LAB — I-STAT TROPONIN, ED: Troponin i, poc: 0.01 ng/mL (ref 0.00–0.08)

## 2015-02-03 LAB — MAGNESIUM: MAGNESIUM: 2.3 mg/dL (ref 1.5–2.5)

## 2015-02-03 MED ORDER — APIXABAN 5 MG PO TABS
5.0000 mg | ORAL_TABLET | Freq: Two times a day (BID) | ORAL | Status: DC
  Administered 2015-02-03 – 2015-02-04 (×2): 5 mg via ORAL
  Filled 2015-02-03 (×2): qty 1

## 2015-02-03 MED ORDER — ROSUVASTATIN CALCIUM 10 MG PO TABS
20.0000 mg | ORAL_TABLET | Freq: Every day | ORAL | Status: DC
  Administered 2015-02-03: 20 mg via ORAL
  Filled 2015-02-03: qty 2

## 2015-02-03 MED ORDER — CLOPIDOGREL BISULFATE 75 MG PO TABS
300.0000 mg | ORAL_TABLET | Freq: Once | ORAL | Status: AC
  Administered 2015-02-03: 300 mg via ORAL
  Filled 2015-02-03: qty 4

## 2015-02-03 MED ORDER — SODIUM CHLORIDE 0.9 % IV BOLUS (SEPSIS)
1000.0000 mL | Freq: Once | INTRAVENOUS | Status: AC
  Administered 2015-02-03: 1000 mL via INTRAVENOUS

## 2015-02-03 MED ORDER — CLOPIDOGREL BISULFATE 75 MG PO TABS
75.0000 mg | ORAL_TABLET | Freq: Every day | ORAL | Status: DC
  Administered 2015-02-04: 75 mg via ORAL
  Filled 2015-02-03: qty 1

## 2015-02-03 MED ORDER — METOPROLOL TARTRATE 25 MG PO TABS
25.0000 mg | ORAL_TABLET | Freq: Two times a day (BID) | ORAL | Status: DC
  Administered 2015-02-04: 25 mg via ORAL
  Filled 2015-02-03: qty 1

## 2015-02-03 MED ORDER — DILTIAZEM HCL 100 MG IV SOLR
5.0000 mg/h | INTRAVENOUS | Status: DC
  Administered 2015-02-03: 5 mg/h via INTRAVENOUS
  Filled 2015-02-03: qty 100

## 2015-02-03 MED ORDER — LEVOTHYROXINE SODIUM 88 MCG PO TABS
88.0000 ug | ORAL_TABLET | Freq: Every day | ORAL | Status: DC
  Administered 2015-02-04: 88 ug via ORAL
  Filled 2015-02-03: qty 1

## 2015-02-03 MED ORDER — SODIUM CHLORIDE 0.9 % IV SOLN
INTRAVENOUS | Status: AC
  Administered 2015-02-03: 17:00:00 via INTRAVENOUS

## 2015-02-03 NOTE — H&P (Signed)
Wendy Munoz is an 74 y.o. female.   Chief Complaint:  Weakness HPI:  Wendy Munoz is a 74 y.o. female with a history of coronary artery disease with inferior STEMI 11/2014-DES to RCA, hypertension, and isolated remote history of PAF.  EF is 45-50% by LV gram.   She presents with weakness which started this morning.  She used her pulse ox and her HR was 140.  Sats 95%. She was a little SOB and sweaty but otherwise denies nausea, vomiting, fever, chest pain, orthopnea, dizziness, PND, cough, congestion, abdominal pain, hematochezia, melena, lower extremity edema, claudication.  She has not missed any doses on Brilinta.  She also takes 25 mg lopressor.   She does report having sweet tea last night and green tea this morning.  TSH WNL 12/03/14  Past Medical History  Diagnosis Date  . Hypothyroidism   . Prediabetes   . HTN (hypertension)   . Osteoporosis   . PAF (paroxysmal atrial fibrillation)     a. isolated episode of atrial fibrillation several years ago associated w/ her thyroid dysfunction (prior to ablation).   . GERD (gastroesophageal reflux disease)   . Arthritis   . CAD (coronary artery disease)     a. 11/2014 inf STEMI s/p DES to RCA; 50-70% ruptured plaque in pLAD- rx medically  . Ischemic cardiomyopathy     a. 11/2014  LV gram with inferior hypokinesis. EF 45-50%.  . Atrial tachycardia     a. asymptomatic. Noted on tele during 11/2014 admission     Past Surgical History  Procedure Laterality Date  . Inguinal hernia repair Right   . Umbilical hernia repair      as a baby  . Total knee arthroplasty Right 2006  . Total knee arthroplasty Left 2011  . Cataract extraction Bilateral   . Colonoscopy    . Breast surgery      breast biospy  . Eye surgery Bilateral 2005, 2006    Cataract with lens ImplaNT   . Orif humerus fracture Right 01/27/2014    Procedure: RIGHT OPEN REDUCTION INTERNAL FIXATION (ORIF) PROXIMAL HUMERUS FRACTURE;  Surgeon: Rozanna Box, MD;  Location:  [place];  Service: Orthopedics;  Laterality: Right;  . Left heart catheterization with coronary angiogram N/A 12/02/2014    Procedure: LEFT HEART CATHETERIZATION WITH CORONARY ANGIOGRAM;  Surgeon: Sinclair Grooms, MD;  Location: [facility] CATH LAB;  Service: Cardiovascular;  Laterality: N/A;    Family History  Problem Relation Age of Onset  . Breast cancer    . CAD Mother   . Heart disease Mother   . Heart failure Mother   . Polycythemia Father    Social History:  reports that she has never smoked. She does not have any smokeless tobacco history on file. She reports that she does not drink alcohol or use illicit drugs.  Allergies:  Allergies  Allergen Reactions  . Keflex [Cephalexin] Rash     (Not in a hospital admission)  Results for orders placed or performed during the hospital encounter of 02/03/15 (from the past 48 hour(s))  Comprehensive metabolic panel     Status: Abnormal   Collection Time: 02/03/15  1:07 PM  Result Value Ref Range   Sodium 140 135 - 145 mmol/L   Potassium 3.9 3.5 - 5.1 mmol/L   Chloride 107 96 - 112 mmol/L   CO2 24 19 - 32 mmol/L   Glucose, Bld 139 (H) 70 - 99 mg/dL   BUN 14  6 - 23 mg/dL   Creatinine, Ser 1.27 (H) 0.50 - 1.10 mg/dL   Calcium 9.1 8.4 - 10.5 mg/dL   Total Protein 6.4 6.0 - 8.3 g/dL   Albumin 3.3 (L) 3.5 - 5.2 g/dL   AST 31 0 - 37 U/L   ALT 19 0 - 35 U/L   Alkaline Phosphatase 64 39 - 117 U/L   Total Bilirubin 0.7 0.3 - 1.2 mg/dL   GFR calc non Af Amer 41 (L) >90 mL/min   GFR calc Af Amer 47 (L) >90 mL/min    Comment: (NOTE) The eGFR has been calculated using the CKD EPI equation. This calculation has not been validated in all clinical situations. eGFR's persistently <90 mL/min signify possible Chronic Kidney Disease.    Anion gap 9 5 - 15  I-stat troponin, ED     Status: None   Collection Time: 02/03/15  1:19 PM  Result Value Ref Range   Troponin i, poc 0.01 0.00 - 0.08 ng/mL   Comment 3            Comment: Due to the release  kinetics of cTnI, a negative result within the first hours of the onset of symptoms does not rule out myocardial infarction with certainty. If myocardial infarction is still suspected, repeat the test at appropriate intervals.    No results found.  Review of Systems  Constitutional: Positive for diaphoresis. Negative for fever.  HENT: Negative for congestion and sore throat.   Respiratory: Positive for shortness of breath. Negative for cough.   Cardiovascular: Negative for chest pain, palpitations, orthopnea, leg swelling and PND.  Gastrointestinal: Negative for nausea, vomiting, abdominal pain, blood in stool and melena.  Genitourinary: Negative for hematuria.  Musculoskeletal: Negative for myalgias.  Neurological: Positive for weakness. Negative for dizziness.  All other systems reviewed and are negative.   Blood pressure 105/78, pulse 61, resp. rate 18, SpO2 100 %. Physical Exam  Nursing note and vitals reviewed. Constitutional: She is oriented to person, place, and time. She appears well-developed. No distress.  obese  HENT:  Head: Normocephalic and atraumatic.  Eyes: EOM are normal. Pupils are equal, round, and reactive to light. No scleral icterus.  Neck: Normal range of motion. Neck supple.  Cardiovascular: Normal rate, S1 normal and S2 normal.  An irregularly irregular rhythm present.  No murmur heard. Pulses:      Radial pulses are 2+ on the right side, and 2+ on the left side.       Dorsalis pedis pulses are 2+ on the right side, and 2+ on the left side.  Respiratory: Effort normal. She has no wheezes. She has no rales.  GI: Soft. Bowel sounds are normal. She exhibits no distension. There is no tenderness.  Musculoskeletal: She exhibits no edema.  Lymphadenopathy:    She has no cervical adenopathy.  Neurological: She is alert and oriented to person, place, and time. She exhibits normal muscle tone.  Skin: Skin is warm and dry.  Psychiatric: She has a normal mood  and affect.     Assessment/Plan Principal Problem:   Atrial fibrillation with RVR Active Problems:   Hypothyroidism   HTN (hypertension)   CAD (coronary artery disease)   Ischemic cardiomyopathy  Plan:   Admit for obs.  Afib onset less than 48hrs(this morning) .  Rate is currently controlled.  Will try increasing lopressor to 37.10m BID.  BP is a little soft.  Checked echo.  SCr is elevated and GFR down compared to Feb.  She may need a little hydration.   See MD note regarding anticoagulation.    Tarri Fuller, PA-C 02/03/2015, 2:20 PM   I have personally seen and examined this patient with Tarri Fuller, PA-C. I agree with the assessment and plan as outlined above. She is 2 months out from her STEMI. She has been on ASA and Brilinta and doing well. Now with atrial fib with RVR. Will admit to telemetry and rate control with IV Cardizem. Long discussion with pt and family about options for anti-platelet therapy and anti-coagulation. CHADS VASC score is 4 (4.8% risk of CVA per year). I think she should be started on anti-coagulation. To limit risk of bleeding, will stop Brilinta and start Plavix (after 300 mg load). Will stop ASA. Will start Eliquis 5 mg po BID. Also discussed this plan with her cardiologist Dr. Tamala Julian who agrees.   Marnie Fazzino 02/03/2015 3:24 PM

## 2015-02-03 NOTE — ED Notes (Signed)
Pt states she went to bed last night fine and woke up with generalized weakness, low BP 88/60, and high pulse 140.  Pt states she felt kinda jittery so called EMS.  20 g placed in left [facility]

## 2015-02-03 NOTE — Progress Notes (Signed)
MD on call notified for foamy/frothy urine and requested UA. Pt states been this way for 2+ weeks. UA order received. Will continue to monitor closely.

## 2015-02-03 NOTE — Progress Notes (Signed)
MD on call notified of rhythm change from A.fib to NSR for approx 2 hours. EKG obtained. VSS. Cardizem still infusing at 18ml/hr. Asked physician if Cardizem gtt could be discontinued. Will continue to monitor closely.

## 2015-02-03 NOTE — ED Provider Notes (Signed)
CSN: [id number]     Arrival date & time 02/03/15  1220 History   First MD Initiated Contact with Patient 02/03/15 1221     Chief Complaint  Patient presents with  . Tachycardia     (Consider location/radiation/quality/duration/timing/severity/associated sxs/prior Treatment) The history is provided by the patient.  JAYLEANA COLBERG is a 74 y.o. female hx of HTN, paroxysmal afib, CAD with RCA stent 2 months ago here presenting with palpitations. Woke up this morning with some palpitations. Denies any chest pain but just has generalized weakness. Took her pulse and was 140 and her blood pressure was 80/60 so she came in for evaluation. She states that when she was in the hospital she symptoms get palpitations. She has a history of A. fib in the past and is currently on Brilinta after the stents. States that the palpitation has resolved since she has come to the ER.    Past Medical History  Diagnosis Date  . Hypothyroidism   . Prediabetes   . HTN (hypertension)   . Osteoporosis   . PAF (paroxysmal atrial fibrillation)     a. isolated episode of atrial fibrillation several years ago associated w/ her thyroid dysfunction (prior to ablation).   . GERD (gastroesophageal reflux disease)   . Arthritis   . CAD (coronary artery disease)     a. 11/2014 inf STEMI s/p DES to RCA; 50-70% ruptured plaque in pLAD- rx medically  . Ischemic cardiomyopathy     a. 11/2014  LV gram with inferior hypokinesis. EF 45-50%.  . Atrial tachycardia     a. asymptomatic. Noted on tele during 11/2014 admission    Past Surgical History  Procedure Laterality Date  . Inguinal hernia repair Right   . Umbilical hernia repair      as a baby  . Total knee arthroplasty Right 2006  . Total knee arthroplasty Left 2011  . Cataract extraction Bilateral   . Colonoscopy    . Breast surgery      breast biospy  . Eye surgery Bilateral 2005, 2006    Cataract with lens ImplaNT   . Orif humerus fracture Right 01/27/2014   Procedure: RIGHT OPEN REDUCTION INTERNAL FIXATION (ORIF) PROXIMAL HUMERUS FRACTURE;  Surgeon: Rozanna Box, MD;  Location: [place];  Service: Orthopedics;  Laterality: Right;  . Left heart catheterization with coronary angiogram N/A 12/02/2014    Procedure: LEFT HEART CATHETERIZATION WITH CORONARY ANGIOGRAM;  Surgeon: Sinclair Grooms, MD;  Location[facility] C[facility] LAB;  Service: Cardiovascular;  Laterality: N/A;   Family History  Problem Relation Age of Onset  . Breast cancer    . CAD Mother   . Heart disease Mother   . Heart failure Mother   . Polycythemia Father    History  Substance Use Topics  . Smoking status: Never Smoker   . Smokeless tobacco: Not on file  . Alcohol Use: No   OB History    No data available     Review of Systems  Cardiovascular: Positive for palpitations.  All other systems reviewed and are negative.     Allergies  Keflex  Home Medications   Prior to Admission medications   Medication Sig Start Date End Date Taking? Authorizing Provider  acetaminophen (TYLENOL) 500 MG tablet Take 1-2 tablets (500-1,000 mg total) by mouth every 6 (six) hours as needed for moderate pain or fever. 01/28/14  Yes Ainsley Spinner, PA-C  aspirin EC 81 MG tablet Take 81 mg by mouth daily.   Yes  Historical Provider, MD  Calcium-Magnesium-Vitamin D (CALCIUM MAGNESIUM PO) Take 1 capsule by mouth daily.   Yes Historical Provider, MD  cetirizine (ZYRTEC) 10 MG tablet Take 10 mg by mouth at bedtime.   Yes Historical Provider, MD  Cholecalciferol (VITAMIN D) 2000 UNITS tablet Take 4,000 Units by mouth daily.   Yes Historical Provider, MD  Coenzyme Q10 (CO Q 10) 10 MG CAPS Take 1 capsule by mouth daily.   Yes Historical Provider, MD  docusate sodium 100 MG CAPS Take 100 mg by mouth 2 (two) times daily. 01/28/14  Yes Ainsley Spinner, PA-C  levothyroxine (SYNTHROID, LEVOTHROID) 88 MCG tablet Take 88 mcg by mouth daily before breakfast.   Yes Historical Provider, MD  losartan (COZAAR) 25 MG tablet Take  25 mg by mouth daily. 01/20/15  Yes Historical Provider, MD  metoprolol tartrate (LOPRESSOR) 25 MG tablet Take 1 tablet (25 mg total) by mouth 2 (two) times daily. 02/02/15  Yes Belva Crome, MD  nitroGLYCERIN (NITROSTAT) 0.4 MG SL tablet Place 1 tablet (0.4 mg total) under the tongue every 5 (five) minutes as needed for chest pain. 12/04/14  Yes Eileen Stanford, PA-C  rosuvastatin (CRESTOR) 20 MG tablet Take 1 tablet (20 mg total) by mouth daily. 12/04/14  Yes Eileen Stanford, PA-C  ticagrelor (BRILINTA) 90 MG TABS tablet Take 1 tablet (90 mg total) by mouth 2 (two) times daily. 12/04/14  Yes Eileen Stanford, PA-C   BP 105/78 mmHg  Pulse 61  Resp 18  SpO2 100% Physical Exam  Constitutional: She is oriented to person, place, and time.  Chronically ill, NAD   HENT:  Head: Normocephalic.  Mouth/Throat: Oropharynx is clear and moist.  Eyes: Conjunctivae are normal. Pupils are equal, round, and reactive to light.  Neck: Normal range of motion. Neck supple.  Cardiovascular: Normal rate and normal heart sounds.   Irregular   Pulmonary/Chest: Effort normal and breath sounds normal. No respiratory distress. She has no wheezes. She has no rales.  Abdominal: Soft. Bowel sounds are normal. She exhibits no distension. There is no tenderness. There is no rebound.  Musculoskeletal: Normal range of motion. She exhibits no edema or tenderness.  Neurological: She is alert and oriented to person, place, and time. No cranial nerve deficit. Coordination normal.  Skin: Skin is warm and dry.  Psychiatric: She has a normal mood and affect. Her behavior is normal. Judgment and thought content normal.  Nursing note and vitals reviewed.   ED Course  Procedures (including critical care time) Labs Review Labs Reviewed  CBC WITH DIFFERENTIAL/PLATELET - Abnormal; Notable for the following:    RBC 3.49 (*)    Hemoglobin 10.9 (*)    HCT 32.8 (*)    Monocytes Relative 13 (*)    All other components within  normal limits  COMPREHENSIVE METABOLIC PANEL - Abnormal; Notable for the following:    Glucose, Bld 139 (*)    Creatinine, Ser 1.27 (*)    Albumin 3.3 (*)    GFR calc non Af Amer 41 (*)    GFR calc Af Amer 47 (*)    All other components within normal limits  I-STAT TROPOININ, ED    Imaging Review No results found.   EKG Interpretation   Date/Time:  Wednesday February 03 2015 12:27:12 EDT Ventricular Rate:  72 PR Interval:    QRS Duration: 78 QT Interval:  376 QTC Calculation: 411 R Axis:   29 Text Interpretation:  Atrial fibrillation Low voltage, precordial leads  Abnormal  R-wave progression, early transition afib new since previous   Confirmed by Welby Montminy  MD, Karris Deangelo ([id number]) on 02/03/2015 1:02:49 PM      MDM   Final diagnoses:  None    UMI MAINOR is a 74 y.o. female here with rapid afib. On arrival, BP nl, and HR in 70s. She may need echo since she recently had an MI. Will consult cardiology.   3:17 PM Cardiology to admit for further workup.   Wandra Arthurs, MD 02/03/15 [phone]

## 2015-02-04 DIAGNOSIS — I2510 Atherosclerotic heart disease of native coronary artery without angina pectoris: Secondary | ICD-10-CM | POA: Diagnosis not present

## 2015-02-04 DIAGNOSIS — I4891 Unspecified atrial fibrillation: Secondary | ICD-10-CM | POA: Diagnosis not present

## 2015-02-04 LAB — URINE MICROSCOPIC-ADD ON

## 2015-02-04 LAB — URINALYSIS, ROUTINE W REFLEX MICROSCOPIC
BILIRUBIN URINE: NEGATIVE
Glucose, UA: NEGATIVE mg/dL
KETONES UR: NEGATIVE mg/dL
Nitrite: NEGATIVE
Protein, ur: 30 mg/dL — AB
Specific Gravity, Urine: 1.012 (ref 1.005–1.030)
Urobilinogen, UA: 0.2 mg/dL (ref 0.0–1.0)
pH: 7.5 (ref 5.0–8.0)

## 2015-02-04 MED ORDER — APIXABAN 5 MG PO TABS: 5.0000 mg | ORAL_TABLET | Freq: Two times a day (BID) | ORAL | Status: DC

## 2015-02-04 MED ORDER — CIPROFLOXACIN HCL 500 MG PO TABS: 500.0000 mg | ORAL_TABLET | Freq: Two times a day (BID) | ORAL | Status: DC

## 2015-02-04 MED ORDER — CIPROFLOXACIN HCL 500 MG PO TABS
500.0000 mg | ORAL_TABLET | Freq: Two times a day (BID) | ORAL | Status: DC
  Administered 2015-02-04: 500 mg via ORAL
  Filled 2015-02-04: qty 1

## 2015-02-04 MED ORDER — CLOPIDOGREL BISULFATE 75 MG PO TABS: 75.0000 mg | ORAL_TABLET | Freq: Every day | ORAL | Status: DC

## 2015-02-04 NOTE — Discharge Summary (Signed)
CARDIOLOGY DISCHARGE SUMMARY   Patient ID: Wendy Munoz MRN: [id number] DOB/AGE: [birth year] 74 y.o.  Admit date: 02/03/2015 Discharge date: 02/04/2015  PCP: Myriam Jacobson, MD Primary Cardiologist: Dr Tamala Julian  Primary Discharge Diagnosis:  Atrial fibrillation with RVR   Secondary Discharge Diagnosis:    Hypothyroidism   HTN (hypertension)   CAD (coronary artery disease)   Ischemic cardiomyopathy  Procedures: 2-D echocardiogram  Hospital Course: Wendy LINDSETH is a 74 y.o. female with a history of HTN, hypothyroidism (s/p thyroid ablation), osteoporosis, remote PAF, GERD, arthritis, CAD w/ inf STEMI s/p DES to RCA (11/2014), and ICM (EF 45-50%).   She felt weak and checked her pulse ox with a handheld machine. She noted that her heart rate was 140 with oxygen saturation of 95%. She was slightly short of breath and diaphoretic but no other symptoms. She came to the emergency room where she was in atrial fibrillation with rapid ventricular response. She was admitted for further evaluation and treatment.  Her heart rate was initially elevated and she was started on IV Cardizem. Her heart rate slowed and became better controlled. She was continued on her home dose of beta blocker and the diltiazem was discontinued.  CHADS VASC score is 4 (4.8% risk of CVA per year), and so she was started on Eliquis. Because of the Eliquis, it was felt that she should not be on Brilinta so she was loaded with Plavix. The Brilinta and aspirin were discontinued.  A urinalysis was performed and was positive, so she was started on Cipro. She was mildly anemic but her MCV is normal and the values are in line with previous values. She has no active bleeding issues known.  Overnight, she spontaneously converted to sinus rhythm. Her heart rate was controlled. She was having PVCs and occasional pairs, but her blood pressure was not high enough to increase her metoprolol. Her Cozaar had been held due to  borderline hypotension. Hopefully it can be restarted as an outpatient.   Her TSH had been checked in February when she was here with an MI. He was normal then was not repeated. She was continued on her home dose of levothyroxine.  On 02/04/2015, she was seen by Dr. Acie Fredrickson and an echocardiogram was performed. Results of the echocardiogram are below, her EF is normal. Dr. Acie Fredrickson reviewed all data and evaluated the patient. He felt that no further inpatient workup was indicated and she is considered stable for discharge, to follow up as an outpatient.  Labs:   Lab Results  Component Value Date   WBC 5.3 02/03/2015   HGB 10.9* 02/03/2015   HCT 32.8* 02/03/2015   MCV 94.0 02/03/2015   PLT 232 02/03/2015     Recent Labs Lab 02/03/15 1307  NA 140  K 3.9  CL 107  CO2 24  BUN 14  CREATININE 1.27*  CALCIUM 9.1  PROT 6.4  BILITOT 0.7  ALKPHOS 64  ALT 19  AST 31  GLUCOSE 139*   Lab Results  Component Value Date   TSH 2.330 12/03/2014   Urinalysis    Component Value Date/Time   COLORURINE YELLOW 02/03/2015 2343   APPEARANCEUR CLOUDY* 02/03/2015 2343   LABSPEC 1.012 02/03/2015 2343   PHURINE 7.5 02/03/2015 2343   GLUCOSEU NEGATIVE 02/03/2015 2343   HGBUR TRACE* 02/03/2015 2343   BILIRUBINUR NEGATIVE 02/03/2015 2343   KETONESUR NEGATIVE 02/03/2015 2343   PROTEINUR 30* 02/03/2015 2343   UROBILINOGEN 0.2 02/03/2015 2343   NITRITE NEGATIVE  02/03/2015 2343   LEUKOCYTESUR SMALL* 02/03/2015 2343    EKG: 02/03/2015 Sinus rhythm, low-voltage QRS, prolonged QT Vent. rate 79 BPM PR interval 174 ms QRS duration 74 ms QT/QTc 470/538 ms P-R-T axes 41 34 -4  Echo: 02/04/2015 Conclusions - Left ventricle: The cavity size was normal. Wall thickness was normal. Systolic function was normal. The estimated ejection fraction was in the range of 60% to 65%. - Mitral valve: There was mild regurgitation.  FOLLOW UP PLANS AND APPOINTMENTS Allergies  Allergen Reactions  . Keflex  [Cephalexin] Rash     Medication List    STOP taking these medications        losartan 25 MG tablet  Commonly known as:  COZAAR      TAKE these medications        acetaminophen 500 MG tablet  Commonly known as:  TYLENOL  Take 1-2 tablets (500-1,000 mg total) by mouth every 6 (six) hours as needed for moderate pain or fever.     apixaban 5 MG Tabs tablet  Commonly known as:  ELIQUIS  Take 1 tablet (5 mg total) by mouth 2 (two) times daily.     aspirin EC 81 MG tablet  Take 81 mg by mouth daily.     CALCIUM MAGNESIUM PO  Take 1 capsule by mouth daily.     cetirizine 10 MG tablet  Commonly known as:  ZYRTEC  Take 10 mg by mouth at bedtime.     ciprofloxacin 500 MG tablet  Commonly known as:  CIPRO  Take 1 tablet (500 mg total) by mouth 2 (two) times daily.     clopidogrel 75 MG tablet  Commonly known as:  PLAVIX  Take 1 tablet (75 mg total) by mouth daily.     Co Q 10 10 MG Caps  Take 1 capsule by mouth daily.     DSS 100 MG Caps  Take 100 mg by mouth 2 (two) times daily.     levothyroxine 88 MCG tablet  Commonly known as:  SYNTHROID, LEVOTHROID  Take 88 mcg by mouth daily before breakfast.     metoprolol tartrate 25 MG tablet  Commonly known as:  LOPRESSOR  Take 1 tablet (25 mg total) by mouth 2 (two) times daily.     nitroGLYCERIN 0.4 MG SL tablet  Commonly known as:  NITROSTAT  Place 1 tablet (0.4 mg total) under the tongue every 5 (five) minutes as needed for chest pain.     rosuvastatin 20 MG tablet  Commonly known as:  CRESTOR  Take 1 tablet (20 mg total) by mouth daily.     Vitamin D 2000 UNITS tablet  Take 4,000 Units by mouth daily.        Discharge Instructions    Diet - low sodium heart healthy    Complete by:  As directed      Increase activity slowly    Complete by:  As directed           Follow-up Information    Follow up with Wendy Grooms, MD.   Specialty:  Cardiology   Why:  The office will call.   Contact information:     [address] [address] [phone]       BRING ALL MEDICATIONS WITH YOU TO FOLLOW UP APPOINTMENTS  Time spent with patient to include physician time: 45 min Signed: Rosaria Ferries, PA-C 02/04/2015, 1:54 PM Co-Sign MD  Attending Note:   The patient was seen  and examined.  Agree with assessment and plan as noted above.  Changes made to the above note as needed.  See my note from day of discharge.    Thayer Headings, Brooke Bonito., MD, [facility] 02/05/2015, 8:42 AM [address]  [place] Pager [phone]

## 2015-02-04 NOTE — Progress Notes (Signed)
UR completed 

## 2015-02-04 NOTE — Progress Notes (Signed)
MD on call notified of positive UA results.

## 2015-02-04 NOTE — Progress Notes (Signed)
Patient Name: Wendy Munoz Date of Encounter: 02/04/2015  Principal Problem:   Atrial fibrillation with RVR Active Problems:   Hypothyroidism   HTN (hypertension)   CAD (coronary artery disease)   Ischemic cardiomyopathy   Primary Cardiologist: Dr Tamala Julian  Patient Profile: 74 y.o. female with a history of HTN, hypothyroidism (s/p thyroid ablation), osteoporosis, remote PAF, GERD, arthritis, CAD w/ inf STEMI s/p DES to RCA (11/2014), and ICM (EF 45-50%). Admitted 04/13 w/ afib RVR, TSH nl 11/2014, CHADS VASC score is 4 (4.8% risk of CVA per year). Brilinta changed to Plavix, now on Eliquis as well.  SUBJECTIVE: The afib has not happened in 20 years. Tired, sleeps poorly in the hospital, but generally feels better. Thinks she can go home.   OBJECTIVE Filed Vitals:   02/03/15 2030 02/04/15 0100 02/04/15 0500 02/04/15 0728  BP:  99/40 104/50 124/56  Pulse:  68 64   Temp:  98.4 F (36.9 C) 97.8 F (36.6 C)   TempSrc:  Oral Oral   Resp: 20 16    Height:   5\' 2"  (1.575 m)   Weight:   204 lb (92.534 kg)   SpO2:  98% 98%     Intake/Output Summary (Last 24 hours) at 02/04/15 0757 Last data filed at 02/04/15 6568  Gross per 24 hour  Intake    240 ml  Output    700 ml  Net   -460 ml   Filed Weights   02/03/15 1656 02/04/15 0500  Weight: 202 lb 9.6 oz (91.899 kg) 204 lb (92.534 kg)    PHYSICAL EXAM General: Well developed, well nourished, female in no acute distress. Head: Normocephalic, atraumatic.  Neck: Supple without bruits, JVD minimal elevation. Lungs:  Resp regular and unlabored, CTA. Heart: RRR, S1, S2, no S3, S4, or murmur; no rub. Abdomen: Soft, non-tender, non-distended, BS + x 4.  Extremities: No clubbing, cyanosis, no edema.  Neuro: Alert and oriented X 3. Moves all extremities spontaneously. Psych: Normal affect.  LABS: CBC: Recent Labs  02/03/15 1307  WBC 5.3  NEUTROABS 3.3  HGB 10.9*  HCT 32.8*  MCV 94.0  PLT 127   Basic Metabolic  Panel: Recent Labs  02/03/15 1307 02/03/15 1849  NA 140  --   K 3.9  --   CL 107  --   CO2 24  --   GLUCOSE 139*  --   BUN 14  --   CREATININE 1.27*  --   CALCIUM 9.1  --   MG  --  2.3   Liver Function Tests: Recent Labs  02/01/15 1329 02/03/15 1307  AST  --  31  ALT 17 19  ALKPHOS  --  64  BILITOT  --  0.7  PROT  --  6.4  ALBUMIN  --  3.3*    Recent Labs  02/03/15 1319  TROPIPOC 0.01   Fasting Lipid Panel: Recent Labs  02/01/15 1329  CHOL 154  HDL 63.50  LDLCALC 65  TRIG 129.0  CHOLHDL 2   Urinalysis    Component Value Date/Time   COLORURINE YELLOW 02/03/2015 2343   APPEARANCEUR CLOUDY* 02/03/2015 2343   LABSPEC 1.012 02/03/2015 2343   PHURINE 7.5 02/03/2015 2343   GLUCOSEU NEGATIVE 02/03/2015 2343   HGBUR TRACE* 02/03/2015 2343   BILIRUBINUR NEGATIVE 02/03/2015 2343   KETONESUR NEGATIVE 02/03/2015 2343   PROTEINUR 30* 02/03/2015 2343   UROBILINOGEN 0.2 02/03/2015 2343   NITRITE NEGATIVE 02/03/2015 2343   LEUKOCYTESUR SMALL* 02/03/2015 2343  TELE: SR since last pm, PVCs and occ pairs       Radiology/Studies: No results found.   Current Medications:  . apixaban  5 mg Oral BID  . clopidogrel  75 mg Oral Daily  . levothyroxine  88 mcg Oral QAC breakfast  . metoprolol tartrate  25 mg Oral BID  . rosuvastatin  20 mg Oral q1800      ASSESSMENT AND PLAN: Principal Problem:   Atrial fibrillation with RVR - spontaneous conversion to SR  - SBP 99-127 so do not think can increase BB - MD advise on dig  Active Problems:    Hypothyroidism - TSH was OK in Feb, recheck per MD    HTN (hypertension) - good control on current rx    CAD (coronary artery disease) - no ischemic sx, continue Plavix, statin, BB - no ASA w/ Eliquis    Ischemic cardiomyopathy - BP currently too low to add ACE/ARB    UTI - see UA above, no cx ordered - will rx Cipro  Plan - d/c IVF, ambulate and see how tolerated, possible d/c today  Signed, Rosaria Ferries , PA-C 7:57 AM 02/04/2015   Attending Note:   The patient was seen and examined.  Agree with assessment and plan as noted above.  Changes made to the above note as needed.  Pt is very stable.  Ok to discharge - will see if we can get the results of the echo back She has converted to nsr.  Not much room to go with the metoprolol  DC this afternoon.   Thayer Headings, Brooke Bonito., MD, [facility] 02/04/2015, 10:48 AM [address],  [place] Pager [phone]

## 2015-02-04 NOTE — Care Management Note (Signed)
    Page 1 of 1   02/04/2015     12:02:19 PM CARE MANAGEMENT NOTE 02/04/2015  Patient:  Wendy Munoz, Wendy Munoz   Account Number:  [id number]  Date Initiated:  02/04/2015  Documentation initiated by:  GRAVES-BIGELOW,Aniyiah Zell  Subjective/Objective Assessment:   Pt admitted for Afib RVR.     Action/Plan:   Benefits check in process for Eliquis. CM will make pt aware once completed.   Anticipated DC Date:  02/05/2015   Anticipated DC Plan:  [place]  CM consult  Medication Assistance      Choice offered to / List presented to:             Status of service:  Completed, signed off Medicare Important Message given?  NO (If response is "NO", the following Medicare IM given date fields will be blank) Date Medicare IM given:   Medicare IM given by:   Date Additional Medicare IM given:   Additional Medicare IM given by:    Discharge Disposition:  HOME/SELF CARE  Per UR Regulation:  Reviewed for med. necessity/level of care/duration of stay  If discussed at [place] of Stay Meetings, dates discussed:    Comments:  Per Insurance: PT COPAY WILL BE $40 PRIOR AUTH NOT REQUIRED. Pt uses [facility] in [place] on [address]. CM did call and medication is available and they will honor the 30 day free card.

## 2015-02-04 NOTE — Progress Notes (Signed)
  Echocardiogram 2D Echocardiogram has been performed.  Diamond Nickel 02/04/2015, 10:23 AM

## 2015-02-11 ENCOUNTER — Telehealth: Payer: Self-pay | Admitting: Interventional Cardiology

## 2015-02-11 NOTE — Telephone Encounter (Signed)
Will address when Dr.sMit is back in the office on 4/26

## 2015-02-11 NOTE — Telephone Encounter (Signed)
New message       Need order to return to cardiac rehab.  Pt was hospitalized and need a doctors order to return to rehab.  Fax to [phone]

## 2015-02-15 NOTE — Telephone Encounter (Signed)
Call Cardiac rehab on Tuesday

## 2015-02-15 NOTE — Telephone Encounter (Signed)
F/u    Calling wanting to know status of rehab orders. Please advise

## 2015-02-15 NOTE — Telephone Encounter (Signed)
It is okay for her to return to cardiac rehabilitation.

## 2015-02-15 NOTE — Telephone Encounter (Signed)
Advised that Dr. Tamala Julian will be back in office tomorrow 4/26 and will discuss then when she can return to Cardiac Rehab at [facility]. Need to fax to [phone] att: Judeen Hammans. Will forward to Dr. Tamala Julian and Lattie Haw Parris-Godley,CMA.

## 2015-02-16 NOTE — Telephone Encounter (Signed)
Signed [place] cardiac rehab form faxed attn:Sherry

## 2015-04-15 ENCOUNTER — Telehealth: Payer: Self-pay | Admitting: Interventional Cardiology

## 2015-04-15 ENCOUNTER — Encounter (HOSPITAL_COMMUNITY): Payer: Self-pay | Admitting: *Deleted

## 2015-04-15 ENCOUNTER — Emergency Department (HOSPITAL_COMMUNITY)
Admission: EM | Admit: 2015-04-15 | Discharge: 2015-04-15 | Disposition: A | Discharge status: Home / Self Care | Payer: Medicare Other | Attending: Emergency Medicine | Admitting: Emergency Medicine

## 2015-04-15 DIAGNOSIS — Z79899 Other long term (current) drug therapy: Secondary | ICD-10-CM | POA: Diagnosis not present

## 2015-04-15 DIAGNOSIS — R002 Palpitations: Secondary | ICD-10-CM

## 2015-04-15 DIAGNOSIS — M1990 Unspecified osteoarthritis, unspecified site: Secondary | ICD-10-CM | POA: Diagnosis not present

## 2015-04-15 DIAGNOSIS — I10 Essential (primary) hypertension: Secondary | ICD-10-CM | POA: Insufficient documentation

## 2015-04-15 DIAGNOSIS — Z7902 Long term (current) use of antithrombotics/antiplatelets: Secondary | ICD-10-CM | POA: Diagnosis not present

## 2015-04-15 DIAGNOSIS — E039 Hypothyroidism, unspecified: Secondary | ICD-10-CM | POA: Insufficient documentation

## 2015-04-15 DIAGNOSIS — R5383 Other fatigue: Secondary | ICD-10-CM | POA: Diagnosis present

## 2015-04-15 DIAGNOSIS — I252 Old myocardial infarction: Secondary | ICD-10-CM | POA: Insufficient documentation

## 2015-04-15 DIAGNOSIS — I480 Paroxysmal atrial fibrillation: Secondary | ICD-10-CM | POA: Insufficient documentation

## 2015-04-15 DIAGNOSIS — Z8719 Personal history of other diseases of the digestive system: Secondary | ICD-10-CM | POA: Diagnosis not present

## 2015-04-15 DIAGNOSIS — I2510 Atherosclerotic heart disease of native coronary artery without angina pectoris: Secondary | ICD-10-CM | POA: Insufficient documentation

## 2015-04-15 LAB — BASIC METABOLIC PANEL
ANION GAP: 7 (ref 5–15)
BUN: 12 mg/dL (ref 6–20)
CO2: 23 mmol/L (ref 22–32)
Calcium: 8.6 mg/dL — ABNORMAL LOW (ref 8.9–10.3)
Chloride: 109 mmol/L (ref 101–111)
Creatinine, Ser: 1.11 mg/dL — ABNORMAL HIGH (ref 0.44–1.00)
GFR calc Af Amer: 56 mL/min — ABNORMAL LOW (ref >60)
GFR, EST NON AFRICAN AMERICAN: 48 mL/min — AB (ref >60)
Glucose, Bld: 118 mg/dL — ABNORMAL HIGH (ref 65–99)
POTASSIUM: 5.1 mmol/L (ref 3.5–5.1)
SODIUM: 139 mmol/L (ref 135–145)

## 2015-04-15 LAB — CBC WITH DIFFERENTIAL/PLATELET
BAND NEUTROPHILS: 0 % (ref 0–10)
BASOS ABS: 0.1 10*3/uL (ref 0.0–0.1)
BASOS PCT: 2 % — AB (ref 0–1)
Blasts: 0 %
EOS ABS: 0 10*3/uL (ref 0.0–0.7)
EOS PCT: 0 % (ref 0–5)
HCT: 34.6 % — ABNORMAL LOW (ref 36.0–46.0)
HEMOGLOBIN: 11.3 g/dL — AB (ref 12.0–15.0)
Lymphocytes Relative: 36 % (ref 12–46)
Lymphs Abs: 1.4 10*3/uL (ref 0.7–4.0)
MCH: 30.9 pg (ref 26.0–34.0)
MCHC: 32.7 g/dL (ref 30.0–36.0)
MCV: 94.5 fL (ref 78.0–100.0)
METAMYELOCYTES PCT: 0 %
MONOS PCT: 19 % — AB (ref 3–12)
MYELOCYTES: 0 %
Monocytes Absolute: 0.8 10*3/uL (ref 0.1–1.0)
Neutro Abs: 1.7 10*3/uL (ref 1.7–7.7)
Neutrophils Relative %: 43 % (ref 43–77)
Other: 0 %
PROMYELOCYTES ABS: 0 %
Platelets: ADEQUATE 10*3/uL (ref 150–400)
RBC: 3.66 MIL/uL — AB (ref 3.87–5.11)
RDW: 13.2 % (ref 11.5–15.5)
Smear Review: ADEQUATE
WBC: 4 10*3/uL (ref 4.0–10.5)
nRBC: 1 /100 WBC — ABNORMAL HIGH

## 2015-04-15 LAB — URINE MICROSCOPIC-ADD ON

## 2015-04-15 LAB — URINALYSIS, ROUTINE W REFLEX MICROSCOPIC
Bilirubin Urine: NEGATIVE
GLUCOSE, UA: NEGATIVE mg/dL
Hgb urine dipstick: NEGATIVE
Ketones, ur: NEGATIVE mg/dL
Leukocytes, UA: NEGATIVE
Nitrite: NEGATIVE
Protein, ur: 30 mg/dL — AB
SPECIFIC GRAVITY, URINE: 1.016 (ref 1.005–1.030)
Urobilinogen, UA: 0.2 mg/dL (ref 0.0–1.0)
pH: 7 (ref 5.0–8.0)

## 2015-04-15 LAB — TROPONIN I

## 2015-04-15 MED ORDER — CLOPIDOGREL BISULFATE 75 MG PO TABS
75.0000 mg | ORAL_TABLET | Freq: Every day | ORAL | Status: DC
  Administered 2015-04-15: 75 mg via ORAL
  Filled 2015-04-15: qty 1

## 2015-04-15 MED ORDER — LEVOTHYROXINE SODIUM 88 MCG PO TABS: 88.0000 ug | ORAL_TABLET | Freq: Every day | ORAL | Status: DC

## 2015-04-15 MED ORDER — APIXABAN 5 MG PO TABS
5.0000 mg | ORAL_TABLET | Freq: Two times a day (BID) | ORAL | Status: DC
  Administered 2015-04-15: 5 mg via ORAL
  Filled 2015-04-15 (×2): qty 1

## 2015-04-15 MED ORDER — METOPROLOL TARTRATE 25 MG PO TABS
25.0000 mg | ORAL_TABLET | Freq: Two times a day (BID) | ORAL | Status: DC
  Administered 2015-04-15: 25 mg via ORAL
  Filled 2015-04-15: qty 1

## 2015-04-15 MED ORDER — SODIUM CHLORIDE 0.9 % IV BOLUS (SEPSIS)
500.0000 mL | Freq: Once | INTRAVENOUS | Status: AC
  Administered 2015-04-15: 500 mL via INTRAVENOUS

## 2015-04-15 MED ORDER — LEVOTHYROXINE SODIUM 88 MCG PO TABS
88.0000 ug | ORAL_TABLET | Freq: Every day | ORAL | Status: DC
  Filled 2015-04-15: qty 1

## 2015-04-15 MED ORDER — CIPROFLOXACIN HCL 500 MG PO TABS
250.0000 mg | ORAL_TABLET | Freq: Two times a day (BID) | ORAL | Status: DC
  Administered 2015-04-15: 250 mg via ORAL
  Filled 2015-04-15: qty 1

## 2015-04-15 MED ORDER — LEVOTHYROXINE SODIUM 88 MCG PO TABS
88.0000 ug | ORAL_TABLET | Freq: Every day | ORAL | Status: DC
  Administered 2015-04-15: 88 ug via ORAL
  Filled 2015-04-15 (×2): qty 1

## 2015-04-15 NOTE — ED Notes (Addendum)
Pt states she is unable to urinate now. Pt also states she is currently on Cipro and believes her urine wouldn't show an infection due to antibiotic tx at this time.

## 2015-04-15 NOTE — ED Provider Notes (Addendum)
CSN: [id number]     Arrival date & time 04/15/15  1046 History   First MD Initiated Contact with Patient 04/15/15 1054     Chief Complaint  Patient presents with  . Palpitations  . Fatigue     (Consider location/radiation/quality/duration/timing/severity/associated sxs/prior Treatment) HPI   Wendy Munoz is a 74 y.o. female who presents for evaluation of rapid heartbeat. She reports documented episodes of heart beats in the 140s at home, taken with pulse oxygenation monitor. She did not eat or take any medicines this morning, and came by EMS for evaluation. She has atrial fibrillation and takes both Eliquis, and Plavix. She was recently hospitalized with an episode of uncontrolled atrial fibrillation. She denies chest pain, cough, shortness of breath, nausea or vomiting. She has felt somewhat weak recently. There are no other known modifying factors.   Past Medical History  Diagnosis Date  . Hypothyroidism   . Prediabetes   . HTN (hypertension)   . Osteoporosis   . PAF (paroxysmal atrial fibrillation)     a. isolated episode of atrial fibrillation several years ago associated w/ her thyroid dysfunction (prior to ablation).   . GERD (gastroesophageal reflux disease)   . Arthritis   . CAD (coronary artery disease)     a. 11/2014 inf STEMI s/p DES to RCA; 50-70% ruptured plaque in pLAD- rx medically  . Ischemic cardiomyopathy     a. 11/2014  LV gram with inferior hypokinesis. EF 45-50%.  . Atrial tachycardia     a. asymptomatic. Noted on tele during 11/2014 admission    Past Surgical History  Procedure Laterality Date  . Inguinal hernia repair Right   . Umbilical hernia repair      as a baby  . Total knee arthroplasty Right 2006  . Total knee arthroplasty Left 2011  . Cataract extraction Bilateral   . Colonoscopy    . Breast surgery      breast biospy  . Eye surgery Bilateral 2005, 2006    Cataract with lens ImplaNT   . Orif humerus fracture Right 01/27/2014    Procedure:  RIGHT OPEN REDUCTION INTERNAL FIXATION (ORIF) PROXIMAL HUMERUS FRACTURE;  Surgeon: Rozanna Box, MD;  Location: [facility];  Service: Orthopedics;  Laterality: Right;  . Left heart catheterization with coronary angiogram N/A 12/02/2014    Procedure: LEFT HEART CATHETERIZATION WITH CORONARY ANGIOGRAM;  Surgeon: Sinclair Grooms, MD;  Location: [facility] CATH LAB;  Service: Cardiovascular;  Laterality: N/A;   Family History  Problem Relation Age of Onset  . Breast cancer    . CAD Mother   . Heart disease Mother   . Heart failure Mother   . Polycythemia Father    History  Substance Use Topics  . Smoking status: Never Smoker   . Smokeless tobacco: Not on file  . Alcohol Use: No   OB History    No data available     Review of Systems    Allergies  Keflex  Home Medications   Prior to Admission medications   Medication Sig Start Date End Date Taking? Authorizing Provider  apixaban (ELIQUIS) 5 MG TABS tablet Take 1 tablet (5 mg total) by mouth 2 (two) times daily. 02/04/15  Yes Rhonda G Barrett, PA-C  Calcium-Magnesium-Vitamin D (CALCIUM MAGNESIUM PO) Take 1 capsule by mouth daily.   Yes Historical Provider, MD  cetirizine (ZYRTEC) 10 MG tablet Take 10 mg by mouth at bedtime.   Yes Historical Provider, MD  Cholecalciferol (VITAMIN D) 2000 UNITS tablet  Take 4,000 Units by mouth daily.   Yes Historical Provider, MD  ciprofloxacin (CIPRO) 250 MG tablet Take 250 mg by mouth 2 (two) times daily.   Yes Historical Provider, MD  clopidogrel (PLAVIX) 75 MG tablet Take 1 tablet (75 mg total) by mouth daily. 02/04/15  Yes Rhonda G Barrett, PA-C  Coenzyme Q10 (CO Q 10) 10 MG CAPS Take 1 capsule by mouth daily.   Yes Historical Provider, MD  levothyroxine (SYNTHROID, LEVOTHROID) 88 MCG tablet Take 88 mcg by mouth daily before breakfast.   Yes Historical Provider, MD  metoprolol tartrate (LOPRESSOR) 25 MG tablet Take 1 tablet (25 mg total) by mouth 2 (two) times daily. 02/02/15  Yes Belva Crome, MD   rosuvastatin (CRESTOR) 20 MG tablet Take 1 tablet (20 mg total) by mouth daily. 12/04/14  Yes Eileen Stanford, PA-C  acetaminophen (TYLENOL) 500 MG tablet Take 1-2 tablets (500-1,000 mg total) by mouth every 6 (six) hours as needed for moderate pain or fever. 01/28/14   Ainsley Spinner, PA-C  docusate sodium 100 MG CAPS Take 100 mg by mouth 2 (two) times daily. Patient not taking: Reported on 04/15/2015 01/28/14   Ainsley Spinner, PA-C  nitroGLYCERIN (NITROSTAT) 0.4 MG SL tablet Place 1 tablet (0.4 mg total) under the tongue every 5 (five) minutes as needed for chest pain. 12/04/14   Eileen Stanford, PA-C   BP 113/56 mmHg  Pulse 58  Temp(Src) 97.7 F (36.5 C) (Oral)  Resp 19  Ht 5\' 2"  (1.575 m)  Wt 198 lb (89.812 kg)  BMI 36.21 kg/m2  SpO2 97% Physical Exam  Constitutional: She is oriented to person, place, and time. She appears well-developed.  Elderly, obese  HENT:  Head: Normocephalic and atraumatic.  Right Ear: External ear normal.  Left Ear: External ear normal.  Eyes: Conjunctivae and EOM are normal. Pupils are equal, round, and reactive to light.  Neck: Normal range of motion and phonation normal. Neck supple.  Cardiovascular: Regular rhythm and normal heart sounds.   Mild tachycardia  Pulmonary/Chest: Effort normal and breath sounds normal. She exhibits no bony tenderness.  Abdominal: Soft. There is no tenderness.  Musculoskeletal: Normal range of motion.  Neurological: She is alert and oriented to person, place, and time. No cranial nerve deficit or sensory deficit. She exhibits normal muscle tone. Coordination normal.  Skin: Skin is warm, dry and intact.  Psychiatric: She has a normal mood and affect. Her behavior is normal. Judgment and thought content normal.  Nursing note and vitals reviewed.   ED Course  Procedures (including critical care time)  Medications  apixaban (ELIQUIS) tablet 5 mg (5 mg Oral Given 04/15/15 1339)  ciprofloxacin (CIPRO) tablet 250 mg (250 mg Oral  Given 04/15/15 1226)  clopidogrel (PLAVIX) tablet 75 mg (75 mg Oral Given 04/15/15 1226)  metoprolol tartrate (LOPRESSOR) tablet 25 mg (25 mg Oral Given 04/15/15 1226)  levothyroxine (SYNTHROID, LEVOTHROID) tablet 88 mcg (88 mcg Oral Given 04/15/15 1340)  sodium chloride 0.9 % bolus 500 mL (500 mLs Intravenous New Bag/Given 04/15/15 1141)    Patient Vitals for the past 24 hrs:  BP Temp Temp src Pulse Resp SpO2 Height Weight  04/15/15 1415 113/56 mmHg - - (!) 58 19 97 % - -  04/15/15 1330 (!) 115/52 mmHg - - 64 17 95 % - -  04/15/15 1315 114/68 mmHg - - 67 13 98 % - -  04/15/15 1300 128/62 mmHg - - 78 17 96 % - -  04/15/15 1245 118/61  mmHg - - 73 17 98 % - -  04/15/15 1230 117/90 mmHg - - (!) 45 18 97 % - -  04/15/15 1215 139/60 mmHg - - 105 17 99 % - -  04/15/15 1200 (!) 117/101 mmHg - - 80 17 99 % - -  04/15/15 1145 133/82 mmHg - - (!) 53 15 98 % - -  04/15/15 1115 132/94 mmHg - - - 12 - - -  04/15/15 1100 125/78 mmHg - - 104 14 95 % - -  04/15/15 1052 - 97.7 F (36.5 C) Oral - - - 5\' 2"  (1.575 m) 198 lb (89.812 kg)  04/15/15 1050 (!) 124/50 mmHg - - 101 18 98 % - -  04/15/15 1047 - - - - - 98 % - -    3:10 PM Reevaluation with update and discussion. After initial assessment and treatment, an updated evaluation reveals she remains comfortable. No episodes of tachycardia. She is tolerating food and fluid orally. Findings discussed with patient and family members, all questions answered.Daleen Bo L    Labs Review Labs Reviewed  CBC WITH DIFFERENTIAL/PLATELET - Abnormal; Notable for the following:    RBC 3.66 (*)    Hemoglobin 11.3 (*)    HCT 34.6 (*)    Monocytes Relative 19 (*)    Basophils Relative 2 (*)    nRBC 1 (*)    All other components within normal limits  BASIC METABOLIC PANEL - Abnormal; Notable for the following:    Glucose, Bld 118 (*)    Creatinine, Ser 1.11 (*)    Calcium 8.6 (*)    GFR calc non Af Amer 48 (*)    GFR calc Af Amer 56 (*)    All other  components within normal limits  URINALYSIS, ROUTINE W REFLEX MICROSCOPIC (NOT AT [facility]) - Abnormal; Notable for the following:    Protein, ur 30 (*)    All other components within normal limits  URINE CULTURE  TROPONIN I  URINE MICROSCOPIC-ADD ON    Imaging Review No results found.   EKG Interpretation   Date/Time:  Thursday April 15 2015 10:47:20 EDT Ventricular Rate:  116 PR Interval:  61 QRS Duration: 97 QT Interval:  377 QTC Calculation: 524 R Axis:   0 Text Interpretation:  Sinus tachycardia Multiple premature complexes, vent   Short PR interval Consider right atrial enlargement Low voltage,  precordial leads Since last tracing the premature complexes are new  Confirmed by Eulis Foster  MD, Vira Agar ([id number]) on 04/15/2015 11:01:14 AM      MDM   Final diagnoses:  Palpitation    Episode of palpitation, with history of atrial fibrillation. No transfer, ACS, PE, pneumonia, metabolic instability or serious bacterial infection  Nursing Notes Reviewed/ Care Coordinated Applicable Imaging Reviewed Interpretation of Laboratory Data incorporated into ED treatment  The patient appears reasonably screened and/or stabilized for discharge and I doubt any other medical condition or other [facility] requiring further screening, evaluation, or treatment in the ED at this time prior to discharge.  Plan: Home Medications- usual; Home Treatments- rest, regular diet; return here if the recommended treatment, does not improve the symptoms; Recommended follow up- Cardiology in 5 days as scheduled     Daleen Bo, MD 04/15/15 [place], MD 04/29/15 1328

## 2015-04-15 NOTE — ED Notes (Signed)
Pt has converted back into NSR.

## 2015-04-15 NOTE — Discharge Instructions (Signed)

## 2015-04-15 NOTE — ED Notes (Signed)
Called pharmacy to change start time of synthroid to today and now.

## 2015-04-15 NOTE — Telephone Encounter (Signed)
New message    Pt is hospital problem with afib.  Wanted to let doctor know.  Please advise

## 2015-04-15 NOTE — Telephone Encounter (Signed)
Will fwd an update to Dr.Smith

## 2015-04-15 NOTE — ED Notes (Signed)
Pt is in stable condition upon d/c and is escorted from ED via wheelchair. 

## 2015-04-15 NOTE — ED Notes (Addendum)
Pt arrives from home via [place]. Pt states around 1845 last night she began feeling heart palpitations and fatigued. Pt denies any chest pain, sob, n/v. Pt is in controlled afib and has a hx of afib and is on eliquis. Pt had a MI in Feb and was dx with Afib in April with a hr of 200 per pt report. Pt states she is "kinda feeling palpitations" and still feels fatigued.

## 2015-04-15 NOTE — ED Notes (Signed)
Waiting for Eliquis to be sent by pharmacy.

## 2015-04-16 LAB — URINE CULTURE: SPECIAL REQUESTS: NORMAL

## 2015-04-19 ENCOUNTER — Other Ambulatory Visit: Payer: Self-pay

## 2015-04-19 ENCOUNTER — Telehealth: Payer: Self-pay

## 2015-04-19 NOTE — Telephone Encounter (Signed)
-----   Message from Belva Crome, MD sent at 04/18/2015 11:42 AM EDT ----- Increase metoprolol to 50mg  po BID Schedule 48 hour Holter at least 3-5 days after change in therapy ----- Message -----    From: Daleen Bo, MD    Sent: 04/15/2015   3:05 PM      To: Belva Crome, MD  Hi Wendy Munoz came to the ED today with an episode of palpitation. In the ED, the rate was well controlled. She is taking her usual medications and doing well. I asked her to contact your office to see if anything additional, such as a loop recorder might be required.  Thanks, [employer]

## 2015-04-19 NOTE — Telephone Encounter (Signed)
Called pt to give her Dr.Smith's recommendation. Pt has an appt with him on 6/28. Med change can be discussed at appt

## 2015-04-19 NOTE — Progress Notes (Signed)
Cardiology Office Note   Date:  04/20/2015   ID:  Wendy Munoz, DOB 1941-06-06, MRN [id number]  PCP:  Myriam Jacobson, MD  Cardiologist:  Sinclair Grooms, MD   Chief Complaint  Patient presents with  . Atrial Fibrillation      History of Present Illness: Wendy Munoz is a 74 y.o. female who presents for coronary artery disease with RCA DES to an acute inferior STEMI febrile or 2016, hypertension, paroxysmal atrial fibrillation, chronic anticoagulation therapy.  Wendy Munoz is done well. Her initial cardiac presentation was with an acute inferior ST elevation infarction and febrile or 2016. She was readmitted to the hospital in April 2016 with atrial fibrillation and rapid ventricular response. She subsequently had an emergency room visit for recurrent atrial fibrillation in June 2016. On both occasions she had spontaneous reversion.  According to the husband the patient snores and stops breathing during sleep. This could be a connection with her atrial fibrillation.  Past Medical History  Diagnosis Date  . Hypothyroidism   . Prediabetes   . HTN (hypertension)   . Osteoporosis   . PAF (paroxysmal atrial fibrillation)     a. isolated episode of atrial fibrillation several years ago associated w/ her thyroid dysfunction (prior to ablation).   . GERD (gastroesophageal reflux disease)   . Arthritis   . CAD (coronary artery disease)     a. 11/2014 inf STEMI s/p DES to RCA; 50-70% ruptured plaque in pLAD- rx medically  . Ischemic cardiomyopathy     a. 11/2014  LV gram with inferior hypokinesis. EF 45-50%.  . Atrial tachycardia     a. asymptomatic. Noted on tele during 11/2014 admission     Past Surgical History  Procedure Laterality Date  . Inguinal hernia repair Right   . Umbilical hernia repair      as a baby  . Total knee arthroplasty Right 2006  . Total knee arthroplasty Left 2011  . Cataract extraction Bilateral   . Colonoscopy    . Breast surgery     breast biospy  . Eye surgery Bilateral 2005, 2006    Cataract with lens ImplaNT   . Orif humerus fracture Right 01/27/2014    Procedure: RIGHT OPEN REDUCTION INTERNAL FIXATION (ORIF) PROXIMAL HUMERUS FRACTURE;  Surgeon: Rozanna Box, MD;  Location: [facility];  Service: Orthopedics;  Laterality: Right;  . Left heart catheterization with coronary angiogram N/A 12/02/2014    Procedure: LEFT HEART CATHETERIZATION WITH CORONARY ANGIOGRAM;  Surgeon: Sinclair Grooms, MD;  Location: [facility] CATH LAB;  Service: Cardiovascular;  Laterality: N/A;     Current Outpatient Prescriptions  Medication Sig Dispense Refill  . acetaminophen (TYLENOL) 500 MG tablet Take 1-2 tablets (500-1,000 mg total) by mouth every 6 (six) hours as needed for moderate pain or fever. 90 tablet 0  . apixaban (ELIQUIS) 5 MG TABS tablet Take 1 tablet (5 mg total) by mouth 2 (two) times daily. 60 tablet 11  . Calcium-Magnesium-Vitamin D (CALCIUM MAGNESIUM PO) Take 1 capsule by mouth daily.    . cetirizine (ZYRTEC) 10 MG tablet Take 10 mg by mouth at bedtime.    . Cholecalciferol (VITAMIN D) 2000 UNITS tablet Take 4,000 Units by mouth daily.    . ciprofloxacin (CIPRO) 250 MG tablet Take 250 mg by mouth 2 (two) times daily.    . clopidogrel (PLAVIX) 75 MG tablet Take 1 tablet (75 mg total) by mouth daily. 30 tablet 11  . Coenzyme Q10 (CO Q  10) 10 MG CAPS Take 1 capsule by mouth daily.    Marland Kitchen docusate sodium 100 MG CAPS Take 100 mg by mouth 2 (two) times daily. (Patient not taking: Reported on 04/15/2015) 10 capsule 0  . levothyroxine (SYNTHROID, LEVOTHROID) 88 MCG tablet Take 88 mcg by mouth daily before breakfast.    . metoprolol tartrate (LOPRESSOR) 25 MG tablet Take 1 tablet (25 mg total) by mouth 2 (two) times daily. 60 tablet 5  . nitroGLYCERIN (NITROSTAT) 0.4 MG SL tablet Place 1 tablet (0.4 mg total) under the tongue every 5 (five) minutes as needed for chest pain. 25 tablet 12  . rosuvastatin (CRESTOR) 20 MG tablet Take 1 tablet (20 mg  total) by mouth daily. 30 tablet 11   No current facility-administered medications for this visit.    Allergies:   Keflex    Social History:  The patient  reports that she has never smoked. She does not have any smokeless tobacco history on file. She reports that she does not drink alcohol or use illicit drugs.   Family History:  The patient's family history includes Breast cancer in an other family member; CAD in her mother; Heart disease in her mother; Heart failure in her mother; Polycythemia in her father.    ROS:  Please see the history of present illness.   Otherwise, review of systems are positive for multiple anxieties.   All other systems are reviewed and negative.    PHYSICAL EXAM: VS:  There were no vitals taken for this visit. , BMI There is no weight on file to calculate BMI. GEN: Well nourished, well developed, in no acute distress HEENT: normal Neck: no JVD, carotid bruits, or masses Cardiac: RRR; no murmurs, rubs, or gallops,no edema  Respiratory:  clear to auscultation bilaterally, normal work of breathing GI: soft, nontender, nondistended, + BS MS: no deformity or atrophy Skin: warm and dry, no rash Neuro:  Strength and sensation are intact Psych: euthymic mood, full affect   EKG:  EKG is ordered today. The ECG shows sinus bradycardia.    Recent Labs: 12/03/2014: TSH 2.330 02/03/2015: ALT 19; Magnesium 2.3 04/15/2015: BUN 12; Creatinine, Ser 1.11*; Hemoglobin 11.3*; Platelets PLATELETS APPEAR ADEQUATE; Potassium 5.1; Sodium 139    Lipid Panel    Component Value Date/Time   CHOL 154 02/01/2015 1329   TRIG 129.0 02/01/2015 1329   HDL 63.50 02/01/2015 1329   CHOLHDL 2 02/01/2015 1329   VLDL 25.8 02/01/2015 1329   LDLCALC 65 02/01/2015 1329      Wt Readings from Last 3 Encounters:  04/15/15 89.812 kg (198 lb)  02/04/15 92.534 kg (204 lb)  02/01/15 92.897 kg (204 lb 12.8 oz)      Other studies Reviewed: Additional studies/ records that were  reviewed today include: . Review of the above records demonstrates:    ASSESSMENT AND PLAN:  1. PAF (paroxysmal atrial fibrillation) Two recent acute episodes with one requiring hospitalization in April and a second leading to an emergency room visit recently  2. Snoring and insomnia. R/O sleep apnea.  3. Essential hypertension Adequate control  4. Coronary artery disease involving native coronary artery of native heart without angina pectoris Inferior as to elevation myocardial infarction treated with DES in February 2016. No recurrence of angina    Current medicines are reviewed at length with the patient today.  The patient has concerns regarding medicines.  The following changes have been made:  no change  Labs/ tests ordered today include:  No orders of  the defined types were placed in this encounter.     Disposition:   FU with HS in 4 months  Signed, Sinclair Grooms, MD  04/20/2015 1:32 PM    [facility] [place], [place], [place]  [address] Phone: [phone]; Fax: [phone]

## 2015-04-20 ENCOUNTER — Encounter: Payer: Self-pay | Admitting: Interventional Cardiology

## 2015-04-20 ENCOUNTER — Ambulatory Visit (INDEPENDENT_AMBULATORY_CARE_PROVIDER_SITE_OTHER): Payer: Medicare Other | Admitting: Interventional Cardiology

## 2015-04-20 VITALS — BP 130/72 | HR 55 | Ht 62.0 in | Wt 201.2 lb

## 2015-04-20 DIAGNOSIS — I2510 Atherosclerotic heart disease of native coronary artery without angina pectoris: Secondary | ICD-10-CM | POA: Diagnosis not present

## 2015-04-20 DIAGNOSIS — I480 Paroxysmal atrial fibrillation: Secondary | ICD-10-CM | POA: Diagnosis not present

## 2015-04-20 DIAGNOSIS — I10 Essential (primary) hypertension: Secondary | ICD-10-CM

## 2015-04-20 DIAGNOSIS — I255 Ischemic cardiomyopathy: Secondary | ICD-10-CM

## 2015-04-20 DIAGNOSIS — R0683 Snoring: Secondary | ICD-10-CM

## 2015-04-20 NOTE — Patient Instructions (Signed)
Medication Instructions:  Your physician recommends that you continue on your current medications as directed. Please refer to the Current Medication list given to you today.   Labwork: None   Testing/Procedures: Your physician has recommended that you have a sleep study. This test records several body functions during sleep, including: brain activity, eye movement, oxygen and carbon dioxide blood levels, heart rate and rhythm, breathing rate and rhythm, the flow of air through your mouth and nose, snoring, body muscle movements, and chest and belly movement.  Follow-Up: Your physician recommends that you schedule a follow-up appointment in: 3-4 months   Any Other Special Instructions Will Be Listed Below (If Applicable).

## 2015-04-27 ENCOUNTER — Telehealth: Payer: Self-pay | Admitting: Interventional Cardiology

## 2015-04-27 NOTE — Telephone Encounter (Signed)
Pt states that Sunday evening her HR jumped up to 140 around 7:30pm. Pt states that she called the on call and spoke with Dr. Wynonia Lawman who ordered for her to 2 of her 25mg  Metoprolol instead of just 1. Pt states that HR came back down and has remained in the 60's since Sunday evening. Pt states that Dr. Wynonia Lawman told her to call the office so that Dr. Tamala Julian could be made aware. Will forward to Dr. Tamala Julian for review and advisement.

## 2015-04-27 NOTE — Telephone Encounter (Signed)
Spoke with pt and informed her that Dr. Tamala Julian said no further action if she is back to baseline. Informed pt that if she has another episode to let our office know. Pt verbalized understanding and was in agreement with this plan.

## 2015-04-27 NOTE — Telephone Encounter (Signed)
New message     Patient called the on-call MD Sunday evening around  7:30 pm spoke with Dr. Wynonia Lawman advise to take additional metoprolol 50 mg at that time. Normal dosage is  25 mg . For her AFib.    Was advise to call the office on Tuesday.     Patient states she is not having any chest pain nor sob .

## 2015-04-27 NOTE — Telephone Encounter (Signed)
No further action if she is back to baseline.

## 2015-05-10 ENCOUNTER — Telehealth: Payer: Self-pay | Admitting: Interventional Cardiology

## 2015-05-11 ENCOUNTER — Telehealth: Payer: Self-pay

## 2015-05-11 NOTE — Telephone Encounter (Signed)
30 day supply of sample Eliquist 5 mg

## 2015-05-26 ENCOUNTER — Telehealth: Payer: Self-pay | Admitting: Interventional Cardiology

## 2015-05-26 NOTE — Telephone Encounter (Signed)
New message      Pt c/o medication issue:  1. Name of Medication: Metoprolol  2. How are you currently taking this medication (dosage and times per day)? 25mg  2 times a day (am &pm)  3. Are you having a reaction (difficulty breathing--STAT)? No  4. What is your medication issue? Pt thinks medication is causing her not to be able to sleep  Please call to discuss

## 2015-05-26 NOTE — Telephone Encounter (Signed)
Returned pt call. Pt sts that she is doing ok except that she has had a couple of nights were she has not been able to sleep. Pt sts that she has always had trouble sleeping. She has read that Metoprolol can contribute to this. She want to know if it would be ok to take Melatonin OTC.   Adv her that I will talk with Lucillie Garfinkel who is our flex today.  Adv her that Gerald Stabs does not think her insomnia is related to Metoprolol. It would be ok to try Melatonin OTC.. Adv pt to turn off all lights and noise,keeping her room as dark as possible,  She should try taking the melatonin as needed. Adv her to call back in a couple of weeks if no change

## 2015-06-14 ENCOUNTER — Telehealth: Payer: Self-pay | Admitting: Interventional Cardiology

## 2015-06-14 NOTE — Telephone Encounter (Signed)
New message     Pt states she had not had any sleep the last 2 night Pt wanting to know what you would recommend to help with sleeping considering the medications that she already takes

## 2015-06-14 NOTE — Telephone Encounter (Signed)
Pt sts that she has tried OTC melatonin to help her sleep and it does not help. Adv her to f/u with her pcp, he may be able to prescribe something to help her sleep. Pt has appt with her pcp Dr.Roberts on 8/31. She will discuss it with him at her appt

## 2015-07-01 NOTE — Telephone Encounter (Signed)
error 

## 2015-07-04 ENCOUNTER — Ambulatory Visit (HOSPITAL_BASED_OUTPATIENT_CLINIC_OR_DEPARTMENT_OTHER): Payer: Medicare Other | Attending: Interventional Cardiology

## 2015-07-04 VITALS — Ht 62.0 in | Wt 195.0 lb

## 2015-07-04 DIAGNOSIS — I493 Ventricular premature depolarization: Secondary | ICD-10-CM | POA: Diagnosis not present

## 2015-07-04 DIAGNOSIS — G4761 Periodic limb movement disorder: Secondary | ICD-10-CM | POA: Insufficient documentation

## 2015-07-04 DIAGNOSIS — G4733 Obstructive sleep apnea (adult) (pediatric): Secondary | ICD-10-CM

## 2015-07-04 DIAGNOSIS — R0683 Snoring: Secondary | ICD-10-CM | POA: Diagnosis not present

## 2015-07-04 DIAGNOSIS — R0681 Apnea, not elsewhere classified: Secondary | ICD-10-CM | POA: Diagnosis not present

## 2015-07-05 ENCOUNTER — Telehealth: Payer: Self-pay | Admitting: Cardiology

## 2015-07-05 DIAGNOSIS — R0681 Apnea, not elsewhere classified: Secondary | ICD-10-CM | POA: Insufficient documentation

## 2015-07-05 NOTE — Sleep Study (Signed)
     Patient Name: Wendy Munoz, Wendy Munoz MRN: [id number] Study Date: 07/04/2015 Gender: Female D.O.B: 03-04-1941 Age (years): 38 Referring Provider: Daneen Schick Interpreting Physician: Fransico Him MD, ABSM RPSGT: Joni Reining  Height (inches): 62 Weight (lbs): 195 BMI: 36 Neck Size: 15.00  CLINICAL INFORMATION  Sleep Study Type: NPSG  Indication for sleep study: Snoring  Epworth Sleepiness Score: 3  SLEEP STUDY TECHNIQUE As per the AASM Manual for the Scoring of Sleep and Associated Events v2.3 (April 2016) with a hypopnea requiring 4% desaturations.  The channels recorded and monitored were frontal, central and occipital EEG, electrooculogram (EOG), submentalis EMG (chin), nasal and oral airflow, thoracic and abdominal wall motion, anterior tibialis EMG, snore microphone, electrocardiogram, and pulse oximetry.  MEDICATIONS Patient's medications include: Eliquis, Zyrtec, Plavix, Synthroid, Metoprolol, Crestor. Medications self-administered by patient during sleep study : No sleep medicine administered.  SLEEP ARCHITECTURE The study was initiated at 11:08:21 PM and ended at 5:09:59 AM.  Sleep onset time was prolonged at 79.7 minutes and the sleep efficiency was reduced at 44.5%. The total sleep time was  reduced at 161.0 minutes.  Stage REM latency was markedly prolonged at 210.5 minutes.  The patient spent 17.39% of the night in stage N1 sleep, 70.50% in stage N2 sleep, 0.00% in stage N3 and 12.11% in REM.  Alpha intrusion was absent.  Supine sleep was 0.00%.  RESPIRATORY PARAMETERS  The overall apnea/hypopnea index (AHI) was 0.0 per hour. There were 0 total apneas, including 0 obstructive, 0 central and 0 mixed apneas. There were 0 hypopneas and 0 RERAs.  The AHI during Stage REM sleep was 0.0 per hour.  AHI while supine was N/A per hour.  The mean oxygen saturation was 92.53%. The minimum SpO2 during sleep was 87.00%.  snoring was noted during this  study.  CARDIAC DATA The 2 lead EKG demonstrated sinus rhythm. The mean heart rate was 58.92 beats per minute. Other EKG findings include: PVCs.  LEG MOVEMENT DATA The total PLMS were 505 with a resulting PLMS index of 188.20. Associated arousal with leg movement index was 2.6 .  IMPRESSIONS No significant obstructive sleep apnea occurred during this study (AHI = 0.0/h) but patient had very restless sleep and only slept for a little over 2 hours.   No significant central sleep apnea occurred during this study (CAI = 0.0/h). Mild oxygen desaturation was noted during this study (Min O2 = 87.00%). No snoring was audible during this study. EKG findings include PVCs. Severe periodic limb movements of sleep occurred during the study. No significant associated arousals.  DIAGNOSIS Periodic Limb Movement disorder  RECOMMENDATIONS Avoid alcohol, sedatives and other CNS depressants that may worsen sleep apnea and disrupt normal sleep architecture. Sleep hygiene should be reviewed to assess factors that may improve sleep quality. Weight management and regular exercise should be initiated or continued if appropriate. As patient was only able to sleep for slightly over 2 hours with very poor sleep efficiency and husband has noted apneic episodes at home, recommend repeat study with a sleep aide.    Sueanne Margarita Diplomate, American Board of Sleep Medicine  ELECTRONICALLY SIGNED ON:  07/05/2015, 8:56 PM [place] PH: [phone]   FX: [phone] [place]

## 2015-07-05 NOTE — Telephone Encounter (Signed)
Please let patient know that sleep study showed no significant sleep apnea but there was minimal time sleeping.  Please repeat sleep study with Lunesta 2mg  (#1 tablet with no refills) to take on arrival at sleep lab.  Do not take Zyrtec that night.

## 2015-07-06 NOTE — Telephone Encounter (Signed)
Patient stated that she took two medications that she normally takes at night to help her sleep, and she is not sure that she wants to do this again. She said that she is scheduled to see Dr. Tamala Julian on 07/23/15 and she will decide by then if she wants to repeat the study.   I have forwarded this information to Dr. Thompson Caul CMA so they will have it for her appointment on the 30th.

## 2015-07-23 ENCOUNTER — Encounter: Payer: Self-pay | Admitting: Interventional Cardiology

## 2015-07-23 ENCOUNTER — Ambulatory Visit (INDEPENDENT_AMBULATORY_CARE_PROVIDER_SITE_OTHER): Payer: Medicare Other | Admitting: Interventional Cardiology

## 2015-07-23 VITALS — BP 114/70 | HR 66 | Ht 62.0 in | Wt 192.4 lb

## 2015-07-23 DIAGNOSIS — I2510 Atherosclerotic heart disease of native coronary artery without angina pectoris: Secondary | ICD-10-CM | POA: Diagnosis not present

## 2015-07-23 DIAGNOSIS — I10 Essential (primary) hypertension: Secondary | ICD-10-CM | POA: Diagnosis not present

## 2015-07-23 DIAGNOSIS — I480 Paroxysmal atrial fibrillation: Secondary | ICD-10-CM

## 2015-07-23 DIAGNOSIS — E785 Hyperlipidemia, unspecified: Secondary | ICD-10-CM | POA: Diagnosis not present

## 2015-07-23 LAB — LIPID PANEL
CHOL/HDL RATIO: 2
Cholesterol: 149 mg/dL (ref 0–200)
HDL: 66.1 mg/dL (ref >39.00)
LDL Cholesterol: 60 mg/dL (ref 0–99)
NonHDL: 83.06
Triglycerides: 116 mg/dL (ref 0.0–149.0)
VLDL: 23.2 mg/dL (ref 0.0–40.0)

## 2015-07-23 LAB — ALT: ALT: 19 U/L (ref 0–35)

## 2015-07-23 NOTE — Patient Instructions (Signed)
Medication Instructions:  Your physician recommends that you continue on your current medications as directed. Please refer to the Current Medication list given to you today.   Labwork: Lipid and alt today  Testing/Procedures: None ordered  Follow-Up: Your physician wants you to follow-up in: 6 months with Dr.Smith You will receive a reminder letter in the mail two months in advance. If you don't receive a letter, please call our office to schedule the follow-up appointment.   Any Other Special Instructions Will Be Listed Below (If Applicable).

## 2015-07-23 NOTE — Progress Notes (Signed)
Cardiology Office Note   Date:  07/23/2015   ID:  Wendy Munoz, DOB 1941-09-07, MRN [id number]  PCP:  Myriam Jacobson, MD  Cardiologist:  Sinclair Grooms, MD   Chief Complaint  Patient presents with  . Coronary Artery Disease      History of Present Illness: Wendy Munoz is a 74 y.o. female who presents for mitral valve disease, hypertensive heart disease with chronic combined systolic and diastolic heart failure, paroxysmal atrial fibrillation with recent hospitalization for same.  Overall the patient is doing relatively well. Since discharge from the hospital in April with atrial fibrillation she has had brief episodes of rapid heart rate. She is on anticoagulation therapy without significant bleeding but is quite concerned about the cost of Eliquis.  Since the last office visit she is undergoing a sleep study but it was not diagnostic. The patient was not able to get to sleep during the attempted study.  She did have inferior ST elevation myocardial infarction inferior border 2016 treated with DES.  After the episode of atrial fibrillation, Eliquis was added to Plavix. Aspirin is been discontinued. She wants to come off of Eloquis.  Past Medical History  Diagnosis Date  . Hypothyroidism   . Prediabetes   . HTN (hypertension)   . Osteoporosis   . PAF (paroxysmal atrial fibrillation)     a. isolated episode of atrial fibrillation several years ago associated w/ her thyroid dysfunction (prior to ablation).   . GERD (gastroesophageal reflux disease)   . Arthritis   . CAD (coronary artery disease)     a. 11/2014 inf STEMI s/p DES to RCA; 50-70% ruptured plaque in pLAD- rx medically  . Ischemic cardiomyopathy     a. 11/2014  LV gram with inferior hypokinesis. EF 45-50%.  . Atrial tachycardia     a. asymptomatic. Noted on tele during 11/2014 admission     Past Surgical History  Procedure Laterality Date  . Inguinal hernia repair Right   . Umbilical hernia  repair      as a baby  . Total knee arthroplasty Right 2006  . Total knee arthroplasty Left 2011  . Cataract extraction Bilateral   . Colonoscopy    . Breast surgery      breast biospy  . Eye surgery Bilateral 2005, 2006    Cataract with lens ImplaNT   . Orif humerus fracture Right 01/27/2014    Procedure: RIGHT OPEN REDUCTION INTERNAL FIXATION (ORIF) PROXIMAL HUMERUS FRACTURE;  Surgeon: Rozanna Box, MD;  Location: [facility];  Service: Orthopedics;  Laterality: Right;  . Left heart catheterization with coronary angiogram N/A 12/02/2014    Procedure: LEFT HEART CATHETERIZATION WITH CORONARY ANGIOGRAM;  Surgeon: Sinclair Grooms, MD;  Location: [facility] CATH LAB;  Service: Cardiovascular;  Laterality: N/A;     Current Outpatient Prescriptions  Medication Sig Dispense Refill  . acetaminophen (TYLENOL) 500 MG tablet Take 1-2 tablets (500-1,000 mg total) by mouth every 6 (six) hours as needed for moderate pain or fever. 90 tablet 0  . amoxicillin (AMOXIL) 500 MG capsule Take 4 capsules by mouth as needed. Take one (1) hour prior to dental procedures.  2  . apixaban (ELIQUIS) 5 MG TABS tablet Take 1 tablet (5 mg total) by mouth 2 (two) times daily. 60 tablet 11  . Calcium-Magnesium-Vitamin D (CALCIUM MAGNESIUM PO) Take 1 capsule by mouth daily.    . cetirizine (ZYRTEC) 10 MG tablet Take 10 mg by mouth at bedtime.    Marland Kitchen  Cholecalciferol (VITAMIN D) 2000 UNITS tablet Take 4,000 Units by mouth daily.    . clopidogrel (PLAVIX) 75 MG tablet Take 1 tablet (75 mg total) by mouth daily. 30 tablet 11  . Coenzyme Q10 200 MG TABS Take 200 mg by mouth daily.    Marland Kitchen docusate sodium (COLACE) 100 MG capsule Take 100 mg by mouth 2 (two) times daily as needed for mild constipation.    Marland Kitchen levothyroxine (SYNTHROID, LEVOTHROID) 88 MCG tablet Take 88 mcg by mouth daily before breakfast.    . LORazepam (ATIVAN) 0.5 MG tablet Take 0.5 mg by mouth at bedtime as needed for sleep.    . metoprolol tartrate (LOPRESSOR) 25 MG tablet  Take 1 tablet (25 mg total) by mouth 2 (two) times daily. 60 tablet 5  . nitroGLYCERIN (NITROSTAT) 0.4 MG SL tablet Place 1 tablet (0.4 mg total) under the tongue every 5 (five) minutes as needed for chest pain. 25 tablet 12  . rosuvastatin (CRESTOR) 20 MG tablet Take 1 tablet (20 mg total) by mouth daily. 30 tablet 11  . traMADol (ULTRAM) 50 MG tablet Take 50 mg by mouth every 6 (six) hours as needed. (PAIN)  4   No current facility-administered medications for this visit.    Allergies:   Keflex    Social History:  The patient  reports that she has never smoked. She has never used smokeless tobacco. She reports that she does not drink alcohol or use illicit drugs.   Family History:  The patient's family history includes Breast cancer in an other family member; CAD in her mother; Heart disease in her mother; Heart failure in her mother; Polycythemia in her father.    ROS:  Please see the history of present illness.   Otherwise, review of systems are positive for occasional palpitations. Occasional racing heart..   All other systems are reviewed and negative.    PHYSICAL EXAM: VS:  BP 114/70 mmHg  Pulse 66  Ht 5\' 2"  (1.575 m)  Wt 87.272 kg (192 lb 6.4 oz)  BMI 35.18 kg/m2  SpO2 99% , BMI Body mass index is 35.18 kg/(m^2). GEN: Well nourished, well developed, in no acute distress HEENT: normal Neck: no JVD, carotid bruits, or masses Cardiac: RRR.  There is no murmur, rub, or gallop. There is no edema. Respiratory:  clear to auscultation bilaterally, normal work of breathing. GI: soft, nontender, nondistended, + BS MS: no deformity or atrophy Skin: warm and dry, no rash Neuro:  Strength and sensation are intact Psych: euthymic mood, full affect   EKG:  EKG is not ordered today.    Recent Labs: 12/03/2014: TSH 2.330 02/03/2015: ALT 19; Magnesium 2.3 04/15/2015: BUN 12; Creatinine, Ser 1.11*; Hemoglobin 11.3*; Platelets PLATELETS APPEAR ADEQUATE; Potassium 5.1; Sodium 139     Lipid Panel    Component Value Date/Time   CHOL 154 02/01/2015 1329   TRIG 129.0 02/01/2015 1329   HDL 63.50 02/01/2015 1329   CHOLHDL 2 02/01/2015 1329   VLDL 25.8 02/01/2015 1329   LDLCALC 65 02/01/2015 1329      Wt Readings from Last 3 Encounters:  07/23/15 87.272 kg (192 lb 6.4 oz)  07/04/15 88.451 kg (195 lb)  04/20/15 91.264 kg (201 lb 3.2 oz)      Other studies Reviewed: Additional studies/ records that were reviewed today include: Review of recent office visits and evaluation by Dr. Radford Pax for sleep apnea.. The findings include nondiagnostic sleep study due to inability to fall asleep..    ASSESSMENT AND  PLAN:  1. Coronary artery disease involving native coronary artery of native heart without angina pectoris Denies angina - Lipid panel - ALT  2. Essential hypertension Controlled  3. PAF (paroxysmal atrial fibrillation) Possible brief episodes. Protected against systemic emboli by current anticoagulation therapy.  4. Hyperlipidemia On therapy. Will check lipid panel. - Lipid panel - ALT    Current medicines are reviewed at length with the patient today.  The patient has the following concerns regarding medicines: Eliquis and concern about cost. We gave samples today..  The following changes/actions have been instituted:    Liver and lipid panel  Provide samples of Eliquis. We encouraged her to file a hardship request from the company.  Return for clinical follow-up in 6 months  Labs/ tests ordered today include:  No orders of the defined types were placed in this encounter.     Disposition:   FU with HS in 6 months  Signed, Sinclair Grooms, MD  07/23/2015 2:18 PM    [facility] [place], [place], [place]  [address] Phone: [phone]; Fax: [phone]

## 2015-07-26 ENCOUNTER — Other Ambulatory Visit: Payer: Self-pay | Admitting: Interventional Cardiology

## 2015-07-26 ENCOUNTER — Telehealth: Payer: Self-pay

## 2015-07-26 DIAGNOSIS — E785 Hyperlipidemia, unspecified: Secondary | ICD-10-CM

## 2015-07-26 NOTE — Telephone Encounter (Signed)
Pt aware of lab results.  Lipid profile is excellent. Continue same dose, Crestor 20 mg per day.  Pt verbalized understanding.

## 2015-07-26 NOTE — Telephone Encounter (Signed)
-----   Message from Belva Crome, MD sent at 07/23/2015  6:11 PM EDT ----- Lipid profile is excellent. Continue same dose, Crestor 20 mg per day.

## 2015-08-30 ENCOUNTER — Telehealth: Payer: Self-pay | Admitting: Interventional Cardiology

## 2015-08-30 DIAGNOSIS — I4891 Unspecified atrial fibrillation: Secondary | ICD-10-CM

## 2015-08-30 NOTE — Telephone Encounter (Signed)
Returned Wendy Munoz call. Wendy Munoz sts that on Saturday 11/5 she had an episode of weakness and diaphoresis. Her checked her vitals, her bp was 123/64 47bpm. Wendy Munoz sts that she eat lunch, sat, rested and began to feel better. She does not have any energy, she would like to cut back on her Metoprolol. Wendy Munoz is taking 25mg  bid. Wendy Munoz has not had a reoccurrence of weakness. Wendy Munoz is doing ok today. Adv Wendy Munoz I will fwd a message to Dr.Smith and call back with his recommendation. Wendy Munoz agreeable with plan and verbalized understanding.

## 2015-08-30 NOTE — Telephone Encounter (Signed)
Called pt to inform her that we did not have any samples of Eliquis 5 mg tablet and that she could apply for the medicare low-income subsidy program or apply for the patient assistance program. Pt stated that she already has the application and that she would just call back later in the week to see if we have gotten any samples in. I advised the pt that if she has any other problems, questions or concerns to call the office. Pt verbalized understanding.

## 2015-08-30 NOTE — Telephone Encounter (Signed)
Could be HR related. Please get 24 hour Holter.

## 2015-08-30 NOTE — Telephone Encounter (Signed)
New message      Pt states on sat she had an "episode".  She felt weak, broke out in a cold sweat, bp was 123/64 and pulse was 47.  Could she be taking too much metoprolol?  Can she cut back on this medication and see what happens?  Please call And,  Patient calling the office for samples of medication:   1.  What medication and dosage are you requesting samples for? eliquis 5mg   2.  Are you currently out of this medication? no

## 2015-08-31 NOTE — Telephone Encounter (Signed)
Pt aware of Dr.Smith's recommendation. Could be HR related. Please get 24 hour Holter. Pt agreeable to wear monitor. Adv pt a scheduler from our office will call her to schedule. Pt verbalized understanding.

## 2015-09-01 ENCOUNTER — Ambulatory Visit (INDEPENDENT_AMBULATORY_CARE_PROVIDER_SITE_OTHER): Payer: Medicare Other

## 2015-09-01 DIAGNOSIS — I4891 Unspecified atrial fibrillation: Secondary | ICD-10-CM

## 2015-09-01 NOTE — Telephone Encounter (Signed)
Patient calling the office for samples of medication:   1.  What medication and dosage are you requesting samples for?ELIQUIS 5 MG   2.  Are you currently out of this medication? NO    

## 2015-09-02 NOTE — Telephone Encounter (Signed)
Provided 4 sample boxes of Eliquis 5 mg.

## 2015-11-02 ENCOUNTER — Telehealth: Payer: Self-pay | Admitting: Interventional Cardiology

## 2015-11-02 NOTE — Telephone Encounter (Signed)
New message     Patient calling need to renewal her handicap sticker . Her Orthopedic has sent retired.  Expired at the end of month.

## 2015-11-03 NOTE — Telephone Encounter (Signed)
Pt adv that Dr.Smith is agreeable with her having a handicap sticker. Form completed, signed and mailed to pt. Pt mailing address verified. Pt voiced appreciation and verbalized understanding.

## 2016-01-03 DIAGNOSIS — M26621 Arthralgia of right temporomandibular joint: Secondary | ICD-10-CM | POA: Diagnosis not present

## 2016-01-28 ENCOUNTER — Ambulatory Visit (INDEPENDENT_AMBULATORY_CARE_PROVIDER_SITE_OTHER): Payer: PPO | Admitting: Interventional Cardiology

## 2016-01-28 ENCOUNTER — Encounter: Payer: Self-pay | Admitting: Interventional Cardiology

## 2016-01-28 VITALS — BP 114/76 | HR 62 | Ht 63.0 in | Wt 193.8 lb

## 2016-01-28 DIAGNOSIS — I2510 Atherosclerotic heart disease of native coronary artery without angina pectoris: Secondary | ICD-10-CM | POA: Diagnosis not present

## 2016-01-28 DIAGNOSIS — R0683 Snoring: Secondary | ICD-10-CM | POA: Diagnosis not present

## 2016-01-28 DIAGNOSIS — I480 Paroxysmal atrial fibrillation: Secondary | ICD-10-CM

## 2016-01-28 DIAGNOSIS — I10 Essential (primary) hypertension: Secondary | ICD-10-CM

## 2016-01-28 DIAGNOSIS — I255 Ischemic cardiomyopathy: Secondary | ICD-10-CM | POA: Diagnosis not present

## 2016-01-28 MED ORDER — METOPROLOL TARTRATE 25 MG PO TABS: ORAL_TABLET | ORAL | Status: DC

## 2016-01-28 MED ORDER — APIXABAN 5 MG PO TABS: 5.0000 mg | ORAL_TABLET | Freq: Two times a day (BID) | ORAL | Status: DC

## 2016-01-28 NOTE — Progress Notes (Signed)
Cardiology Office Note   Date:  01/28/2016   ID:  [name], DOB [birth year], MRN [id number]  PCP:  Myriam Jacobson, MD  Cardiologist:  Sinclair Grooms, MD   Chief Complaint  Patient presents with  . Coronary Artery Disease      History of Present Illness: Wendy Munoz is a 75 y.o. female who presents for Paroxysmal atrial fibrillation, acute inferior infarct April 2016, hypertension, prediabetes, and chronic diastolic heart failure.  She is doing relatively well. She was admitted to the hospital in April 2016 with atrial fibrillation. She has spontaneous conversion while hospitalized. This episode lasted several hours. Since then she states that about once a month she has a similar feeling that can go on for minutes and then resolved. It usually occurs at night.  Since her acute coronary syndrome in February 2016 she has had no recurrence of chest discomfort. She has not required nitroglycerin. No episodes of syncope. Despite Plavix and Eliquis she has not had bleeding.      Past Medical History  Diagnosis Date  . Hypothyroidism   . Prediabetes   . HTN (hypertension)   . Osteoporosis   . PAF (paroxysmal atrial fibrillation) ([place])     a. isolated episode of atrial fibrillation several years ago associated w/ her thyroid dysfunction (prior to ablation).   . GERD (gastroesophageal reflux disease)   . Arthritis   . CAD (coronary artery disease)     a. 11/2014 inf STEMI s/p DES to RCA; 50-70% ruptured plaque in pLAD- rx medically  . Ischemic cardiomyopathy     a. 11/2014  LV gram with inferior hypokinesis. EF 45-50%.  . Atrial tachycardia ([place])     a. asymptomatic. Noted on tele during 11/2014 admission     Past Surgical History  Procedure Laterality Date  . Inguinal hernia repair Right   . Umbilical hernia repair      as a baby  . Total knee arthroplasty Right 2006  . Total knee arthroplasty Left 2011  . Cataract extraction Bilateral   . Colonoscopy      . Breast surgery      breast biospy  . Eye surgery Bilateral 2005, 2006    Cataract with lens ImplaNT   . Orif humerus fracture Right 01/27/2014    Procedure: RIGHT OPEN REDUCTION INTERNAL FIXATION (ORIF) PROXIMAL HUMERUS FRACTURE;  Surgeon: Rozanna Box, MD;  Location: [facility];  Service: Orthopedics;  Laterality: Right;  . Left heart catheterization with coronary angiogram N/A 12/02/2014    Procedure: LEFT HEART CATHETERIZATION WITH CORONARY ANGIOGRAM;  Surgeon: Sinclair Grooms, MD;  Location: [facility] CATH LAB;  Service: Cardiovascular;  Laterality: N/A;     Current Outpatient Prescriptions  Medication Sig Dispense Refill  . acetaminophen (TYLENOL) 500 MG tablet Take 1-2 tablets (500-1,000 mg total) by mouth every 6 (six) hours as needed for moderate pain or fever. 90 tablet 0  . amoxicillin (AMOXIL) 500 MG capsule Take 4 capsules by mouth as needed. Take one (1) hour prior to dental procedures.  2  . apixaban (ELIQUIS) 5 MG TABS tablet Take 1 tablet (5 mg total) by mouth 2 (two) times daily. 60 tablet 11  . Calcium-Magnesium-Vitamin D (CALCIUM MAGNESIUM PO) Take 1 capsule by mouth daily.    . cetirizine (ZYRTEC) 10 MG tablet Take 10 mg by mouth at bedtime.    . Cholecalciferol (VITAMIN D) 2000 UNITS tablet Take 4,000 Units by mouth daily.    . clopidogrel (PLAVIX)  75 MG tablet Take 1 tablet (75 mg total) by mouth daily. 30 tablet 11  . Coenzyme Q10 200 MG TABS Take 200 mg by mouth daily.    Marland Kitchen levothyroxine (SYNTHROID, LEVOTHROID) 88 MCG tablet Take 88 mcg by mouth daily before breakfast.    . LORazepam (ATIVAN) 0.5 MG tablet Take 0.5 mg by mouth at bedtime as needed for sleep.    . Melatonin 5 MG CAPS Take 5 mg by mouth at bedtime.    . metoprolol tartrate (LOPRESSOR) 25 MG tablet TAKE 1 TABLET (25 MG TOTAL) BY MOUTH 2 (TWO) TIMES DAILY. 60 tablet 5  . nitroGLYCERIN (NITROSTAT) 0.4 MG SL tablet Place 1 tablet (0.4 mg total) under the tongue every 5 (five) minutes as needed for chest pain. 25  tablet 12  . rosuvastatin (CRESTOR) 40 MG tablet Take 20 mg by mouth daily.    . traMADol (ULTRAM) 50 MG tablet Take 50 mg by mouth every 6 (six) hours as needed. (PAIN)  4   No current facility-administered medications for this visit.    Allergies:   Keflex    Social History:  The patient  reports that she has never smoked. She has never used smokeless tobacco. She reports that she does not drink alcohol or use illicit drugs.   Family History:  The patient's family history includes CAD in her mother; Heart disease in her mother; Heart failure in her mother; Polycythemia in her father.    ROS:  Please see the history of present illness.   Otherwise, review of systems are positive for Irregular heartbeat and easy bruising.   All other systems are reviewed and negative.    PHYSICAL EXAM: VS:  BP 114/76 mmHg  Pulse 62  Ht 5\' 3"  (1.6 m)  Wt 193 lb 12.8 oz (87.907 kg)  BMI 34.34 kg/m2 , BMI Body mass index is 34.34 kg/(m^2). GEN: Well nourished, well developed, in no acute distress HEENT: normal Neck: no JVD, carotid bruits, or masses Cardiac: RRR.  There is no murmur, rub, or gallop. There is no edema. Respiratory:  clear to auscultation bilaterally, normal work of breathing. GI: soft, nontender, nondistended, + BS MS: no deformity or atrophy Skin: warm and dry, no rash Neuro:  Strength and sensation are intact Psych: euthymic mood, full affect   EKG:  EKG is ordered today. The ekg reveals normal sinus rhythm with nonspecific T-wave abnormality.   Recent Labs: 02/03/2015: Magnesium 2.3 04/15/2015: BUN 12; Creatinine, Ser 1.11*; Hemoglobin 11.3*; Platelets PLATELETS APPEAR ADEQUATE; Potassium 5.1; Sodium 139 07/23/2015: ALT 19    Lipid Panel    Component Value Date/Time   CHOL 149 07/23/2015 1441   TRIG 116.0 07/23/2015 1441   HDL 66.10 07/23/2015 1441   CHOLHDL 2 07/23/2015 1441   VLDL 23.2 07/23/2015 1441   LDLCALC 60 07/23/2015 1441      Wt Readings from Last 3  Encounters:  01/28/16 193 lb 12.8 oz (87.907 kg)  07/23/15 192 lb 6.4 oz (87.272 kg)  07/04/15 195 lb (88.451 kg)      Other studies Reviewed: Additional studies/ records that were reviewed today include: Reviewed both atrial fibrillation and the acute coronary syndrome admission.. The findings include duration of anticoagulation therapy should be indefinite..    ASSESSMENT AND PLAN:  1. PAF (paroxysmal atrial fibrillation) (HCC) This patients CHA2DS2-VASc Score and unadjusted Ischemic Stroke Rate (% per year) is equal to 3.2 % stroke rate/year from a score of 3  Above score calculated as 1 point each  if present [CHF, HTN, DM, Vascular=MI/PAD/Aortic Plaque, Age if 18-74, or Female] Above score calculated as 2 points each if present [Age > 75, or Stroke/TIA/TE]   2. Coronary artery disease involving native coronary artery of native heart without angina pectoris Asymptomatic  3. Ischemic cardiomyopathy Assume LV function is near normal  4. Snoring Not treated  5. Essential hypertension Well controlled    Current medicines are reviewed at length with the patient today.  The patient has the following concerns regarding medicines: Liane Comber oh which medication she can stop..  The following changes/actions have been instituted:    Plavix is been used for greater than 12 months. We can now discontinue this medication.  Continue Eliquis in definitely  Aerobic activity as tolerated  Labs/ tests ordered today include:  No orders of the defined types were placed in this encounter.     Disposition:   FU with HS in 1 year  Signed, Sinclair Grooms, MD  01/28/2016 2:29 PM    [address], [place], [place]  [address] Phone: [phone]; Fax: [phone]

## 2016-01-28 NOTE — Patient Instructions (Signed)
Medication Instructions:  Stop Plavix.  Labwork: none  Testing/Procedures: none  Follow-Up: Your physician wants you to follow-up in: 1 year with Dr Tamala Julian. (April 2018). You will receive a reminder letter in the mail two months in advance. If you don't receive a letter, please call our office to schedule the follow-up appointment.      If you need a refill on your cardiac medications before your next appointment, please call your pharmacy.

## 2016-05-09 ENCOUNTER — Telehealth: Payer: Self-pay | Admitting: *Deleted

## 2016-05-09 NOTE — Telephone Encounter (Signed)
Eliquis samples placed at the front desk for patient per her request.

## 2016-06-16 ENCOUNTER — Other Ambulatory Visit: Payer: Self-pay | Admitting: Interventional Cardiology

## 2016-06-16 MED ORDER — NITROGLYCERIN 0.4 MG SL SUBL: 0.4000 mg | SUBLINGUAL_TABLET | SUBLINGUAL | 1 refills | Status: DC | PRN

## 2016-06-16 NOTE — Telephone Encounter (Signed)
Patient calling the office for samples of medication:   1.  What medication and dosage are you requesting samples for?Eliquis   2.  Are you currently out of this medication? Not yet

## 2016-06-16 NOTE — Telephone Encounter (Signed)
°*  STAT* If patient is at the pharmacy, call can be transferred to refill team.   1. Which medications need to be refilled? (please list name of each medication and dose if known)Nitroglycerin  2. Which pharmacy/location (including street and city if local pharmacy) is medication to be sent to?[facility] in [place] ph#[phone]  15

## 2016-06-19 ENCOUNTER — Other Ambulatory Visit: Payer: Self-pay | Admitting: Interventional Cardiology

## 2016-06-19 DIAGNOSIS — Z131 Encounter for screening for diabetes mellitus: Secondary | ICD-10-CM | POA: Diagnosis not present

## 2016-06-19 DIAGNOSIS — R1032 Left lower quadrant pain: Secondary | ICD-10-CM | POA: Diagnosis not present

## 2016-06-19 DIAGNOSIS — Z1389 Encounter for screening for other disorder: Secondary | ICD-10-CM | POA: Diagnosis not present

## 2016-06-19 DIAGNOSIS — Z79899 Other long term (current) drug therapy: Secondary | ICD-10-CM | POA: Diagnosis not present

## 2016-06-19 DIAGNOSIS — E039 Hypothyroidism, unspecified: Secondary | ICD-10-CM | POA: Diagnosis not present

## 2016-06-19 DIAGNOSIS — E785 Hyperlipidemia, unspecified: Secondary | ICD-10-CM | POA: Diagnosis not present

## 2016-06-19 DIAGNOSIS — R5383 Other fatigue: Secondary | ICD-10-CM | POA: Diagnosis not present

## 2016-06-19 DIAGNOSIS — R42 Dizziness and giddiness: Secondary | ICD-10-CM | POA: Diagnosis not present

## 2016-06-19 DIAGNOSIS — E1165 Type 2 diabetes mellitus with hyperglycemia: Secondary | ICD-10-CM | POA: Diagnosis not present

## 2016-06-19 DIAGNOSIS — H9113 Presbycusis, bilateral: Secondary | ICD-10-CM | POA: Diagnosis not present

## 2016-06-19 DIAGNOSIS — Z0000 Encounter for general adult medical examination without abnormal findings: Secondary | ICD-10-CM | POA: Diagnosis not present

## 2016-06-19 DIAGNOSIS — Z6834 Body mass index (BMI) 34.0-34.9, adult: Secondary | ICD-10-CM | POA: Diagnosis not present

## 2016-06-19 DIAGNOSIS — R002 Palpitations: Secondary | ICD-10-CM | POA: Diagnosis not present

## 2016-06-19 DIAGNOSIS — Z1211 Encounter for screening for malignant neoplasm of colon: Secondary | ICD-10-CM | POA: Diagnosis not present

## 2016-06-19 DIAGNOSIS — E7800 Pure hypercholesterolemia, unspecified: Secondary | ICD-10-CM | POA: Diagnosis not present

## 2016-06-19 DIAGNOSIS — I70219 Atherosclerosis of native arteries of extremities with intermittent claudication, unspecified extremity: Secondary | ICD-10-CM | POA: Diagnosis not present

## 2016-06-19 NOTE — Telephone Encounter (Signed)
We dont have samples, pt aware

## 2016-06-19 NOTE — Telephone Encounter (Signed)
Follow Up:    Pt calling again to see if we have samples of Eliquis 5mg . She is in town now and would love to pick them is while she is here. Please call and let her know something asap.

## 2016-06-20 ENCOUNTER — Telehealth: Payer: Self-pay | Admitting: Interventional Cardiology

## 2016-06-20 NOTE — Telephone Encounter (Signed)
New message   Patient calling the office for samples of medication:   1.  What medication and dosage are you requesting samples for?  Eliquis 5mg   2.  Are you currently out of this medication? She has a week left

## 2016-06-20 NOTE — Telephone Encounter (Signed)
Per scheduler, patient was upset that she had came to the office twice to pick up her samples and she still has not been able to get any. I made the scheduler aware that we did not have any samples and to let the patient know to call back later this week to see if we get any in. I am not sure that this message was relayed to the patient as the message was still sent to the refill department. I tried to reach patient but number left for me to call does not ring. A message comes on that says "I'm sorry, but the person you have called has a voicemail that has not been set up. We do not have any samples of eliquis at this time.

## 2016-06-21 ENCOUNTER — Telehealth: Payer: Self-pay | Admitting: Interventional Cardiology

## 2016-06-21 NOTE — Telephone Encounter (Signed)
error 

## 2016-06-23 ENCOUNTER — Telehealth: Payer: Self-pay | Admitting: *Deleted

## 2016-06-23 NOTE — Telephone Encounter (Signed)
Patient calling the office for samples of medication:   1.  What medication and dosage are you requesting samples for? Eliquis 5mg  2.  Are you currently out of this medication? Pt has enough for the rest of the week

## 2016-06-23 NOTE — Telephone Encounter (Signed)
explained to pt that she would have to fill out the PA paperwork and could not recieve anymore samples as she refused to do paperwork the first time and admitted to building up a supply to get her to the first of the year. Per conversation with Georgana Curio explaining to her that pt was getting a constant supply of samples and told me that with her RX refill and the samples she would make it to the first of the year and admitted she DID NOT fill out the paperwork given to her the first time due to "it was to much work" we will not give her anymore samples.

## 2016-06-23 NOTE — Telephone Encounter (Signed)
LEFT 2 BOXES OF ELIQUIS 5 MG AT FRONT DESK.

## 2016-07-04 ENCOUNTER — Other Ambulatory Visit: Payer: Self-pay

## 2016-07-04 DIAGNOSIS — I480 Paroxysmal atrial fibrillation: Secondary | ICD-10-CM

## 2016-07-04 DIAGNOSIS — I255 Ischemic cardiomyopathy: Secondary | ICD-10-CM

## 2016-07-04 DIAGNOSIS — I2510 Atherosclerotic heart disease of native coronary artery without angina pectoris: Secondary | ICD-10-CM

## 2016-07-04 DIAGNOSIS — R0683 Snoring: Secondary | ICD-10-CM

## 2016-07-04 DIAGNOSIS — I10 Essential (primary) hypertension: Secondary | ICD-10-CM

## 2016-07-04 MED ORDER — APIXABAN 5 MG PO TABS: 5.0000 mg | ORAL_TABLET | Freq: Two times a day (BID) | ORAL | 3 refills | Status: DC

## 2016-07-13 ENCOUNTER — Telehealth: Payer: Self-pay | Admitting: Interventional Cardiology

## 2016-07-13 NOTE — Telephone Encounter (Signed)
New message    Pt verbalized that she is calling in about the forms that she completed and turned in about week ago because she was told that she can no longer receive samples for Eliquis

## 2016-07-17 NOTE — Telephone Encounter (Signed)
Spoke with patient this a.m. Advised her I have faxed her application for Eliquis today. I needed signed portion from Dr. Tamala Munoz, which was done. She voiced appreciation.

## 2016-07-24 ENCOUNTER — Telehealth: Payer: Self-pay

## 2016-07-24 NOTE — Telephone Encounter (Signed)
Patient was denied assistance for Eliquis through [employer]. She then requested a Tier Exception through [facility] [employer]. This was also denied, as she hasn't spent enough on OOP expenses. I gave her the phone # to the [facility] as a last resort.

## 2016-07-27 ENCOUNTER — Telehealth: Payer: Self-pay | Admitting: Interventional Cardiology

## 2016-07-27 DIAGNOSIS — H5213 Myopia, bilateral: Secondary | ICD-10-CM | POA: Diagnosis not present

## 2016-07-27 DIAGNOSIS — Z961 Presence of intraocular lens: Secondary | ICD-10-CM | POA: Diagnosis not present

## 2016-07-27 NOTE — Telephone Encounter (Signed)
New message      Calling to get prior authorization for eliquis.  Please use reference number [id number]

## 2016-07-28 NOTE — Telephone Encounter (Signed)
New Message  Pt c/o medication issue:  1. Name of Medication: Eliquis  2. How are you currently taking this medication (dosage and times per day)? 5 mg tablets by mouth twice daily  3. Are you having a reaction (difficulty breathing--STAT)? No  4. What is your medication issue? Medication Request from [place], [employer] [phone] phone, [phone], Reference [id number]  Please follow up

## 2016-07-28 NOTE — Telephone Encounter (Signed)
Called [employer] and had to leave messages.

## 2016-12-18 DIAGNOSIS — E0590 Thyrotoxicosis, unspecified without thyrotoxic crisis or storm: Secondary | ICD-10-CM | POA: Diagnosis not present

## 2016-12-18 DIAGNOSIS — E039 Hypothyroidism, unspecified: Secondary | ICD-10-CM | POA: Diagnosis not present

## 2017-01-08 ENCOUNTER — Encounter: Payer: Self-pay | Admitting: Family Medicine

## 2017-01-08 ENCOUNTER — Ambulatory Visit (INDEPENDENT_AMBULATORY_CARE_PROVIDER_SITE_OTHER): Payer: PPO | Admitting: Family Medicine

## 2017-01-08 VITALS — BP 128/77 | HR 64 | Temp 98.1°F | Ht 63.0 in | Wt 204.4 lb

## 2017-01-08 DIAGNOSIS — I10 Essential (primary) hypertension: Secondary | ICD-10-CM

## 2017-01-08 DIAGNOSIS — R351 Nocturia: Secondary | ICD-10-CM | POA: Diagnosis not present

## 2017-01-08 DIAGNOSIS — M810 Age-related osteoporosis without current pathological fracture: Secondary | ICD-10-CM | POA: Diagnosis not present

## 2017-01-08 DIAGNOSIS — E782 Mixed hyperlipidemia: Secondary | ICD-10-CM | POA: Diagnosis not present

## 2017-01-08 DIAGNOSIS — R739 Hyperglycemia, unspecified: Secondary | ICD-10-CM | POA: Diagnosis not present

## 2017-01-08 DIAGNOSIS — E039 Hypothyroidism, unspecified: Secondary | ICD-10-CM | POA: Diagnosis not present

## 2017-01-08 DIAGNOSIS — Z0000 Encounter for general adult medical examination without abnormal findings: Secondary | ICD-10-CM

## 2017-01-08 DIAGNOSIS — R7303 Prediabetes: Secondary | ICD-10-CM

## 2017-01-08 DIAGNOSIS — I480 Paroxysmal atrial fibrillation: Secondary | ICD-10-CM | POA: Diagnosis not present

## 2017-01-08 HISTORY — DX: Nocturia: R35.1

## 2017-01-08 NOTE — Assessment & Plan Note (Signed)
Encouraged to get adequate exercise, calcium and vitamin d intake 

## 2017-01-08 NOTE — Patient Instructions (Signed)
Preventive Care 76 Years and Older, Female Preventive care refers to lifestyle choices and visits with your health care provider that can promote health and wellness. What does preventive care include?  A yearly physical exam. This is also called an annual well check.  Dental exams once or twice a year.  Routine eye exams. Ask your health care provider how often you should have your eyes checked.  Personal lifestyle choices, including:  Daily care of your teeth and gums.  Regular physical activity.  Eating a healthy diet.  Avoiding tobacco and drug use.  Limiting alcohol use.  Practicing safe sex.  Taking low-dose aspirin every day.  Taking vitamin and mineral supplements as recommended by your health care provider. What happens during an annual well check? The services and screenings done by your health care provider during your annual well check will depend on your age, overall health, lifestyle risk factors, and family history of disease. Counseling  Your health care provider may ask you questions about your:  Alcohol use.  Tobacco use.  Drug use.  Emotional well-being.  Home and relationship well-being.  Sexual activity.  Eating habits.  History of falls.  Memory and ability to understand (cognition).  Work and work environment.  Reproductive health. Screening  You may have the following tests or measurements:  Height, weight, and BMI.  Blood pressure.  Lipid and cholesterol levels. These may be checked every 5 years, or more frequently if you are over 50 years old.  Skin check.  Lung cancer screening. You may have this screening every year starting at age 55 if you have a 30-pack-year history of smoking and currently smoke or have quit within the past 15 years.  Fecal occult blood test (FOBT) of the stool. You may have this test every year starting at age 50.  Flexible sigmoidoscopy or colonoscopy. You may have a sigmoidoscopy every 5 years or  a colonoscopy every 10 years starting at age 50.  Hepatitis C blood test.  Hepatitis B blood test.  Sexually transmitted disease (STD) testing.  Diabetes screening. This is done by checking your blood sugar (glucose) after you have not eaten for a while (fasting). You may have this done every 1-3 years.  Bone density scan. This is done to screen for osteoporosis. You may have this done starting at age 76.  Mammogram. This may be done every 1-2 years. Talk to your health care provider about how often you should have regular mammograms. Talk with your health care provider about your test results, treatment options, and if necessary, the need for more tests. Vaccines  Your health care provider may recommend certain vaccines, such as:  Influenza vaccine. This is recommended every year.  Tetanus, diphtheria, and acellular pertussis (Tdap, Td) vaccine. You may need a Td booster every 10 years.  Varicella vaccine. You may need this if you have not been vaccinated.  Zoster vaccine. You may need this after age 60.  Measles, mumps, and rubella (MMR) vaccine. You may need at least one dose of MMR if you were born in 1957 or later. You may also need a second dose.  Pneumococcal 13-valent conjugate (PCV13) vaccine. One dose is recommended after age 76.  Pneumococcal polysaccharide (PPSV23) vaccine. One dose is recommended after age 76.  Meningococcal vaccine. You may need this if you have certain conditions.  Hepatitis A vaccine. You may need this if you have certain conditions or if you travel or work in places where you may be exposed to   hepatitis A.  Hepatitis B vaccine. You may need this if you have certain conditions or if you travel or work in places where you may be exposed to hepatitis B.  Haemophilus influenzae type b (Hib) vaccine. You may need this if you have certain conditions. Talk to your health care provider about which screenings and vaccines you need and how often you need  them. This information is not intended to replace advice given to you by your health care provider. Make sure you discuss any questions you have with your health care provider. Document Released: 11/05/2015 Document Revised: 06/28/2016 Document Reviewed: 08/10/2015 Elsevier Interactive Patient Education  2017 [employer].  

## 2017-01-08 NOTE — Progress Notes (Signed)
Pre visit review using our clinic review tool, if applicable. No additional management support is needed unless otherwise documented below in the visit note. 

## 2017-01-08 NOTE — Assessment & Plan Note (Signed)
Well controlled, no changes to meds. Encouraged heart healthy diet such as the DASH diet and exercise as tolerated.  °

## 2017-01-08 NOTE — Progress Notes (Signed)
Patient ID: Wendy Munoz, female   DOB: [birth year], 76 y.o.   MRN: [id number]   Subjective:  CMA functioned of scribe at visit  Patient ID: Wendy Munoz, female    DOB: 09-May-1941, 76 y.o.   MRN: [id number]  Chief Complaint  Patient presents with  . Establish Care    HPI  Patient is in today for new patient appointment. She has a past medical history of paroxysmal atrial fibrillation, SVT, hypothyroidism, hypercholesterolemia, osteoporosis and more. She feels well today. She does endorse some intermittent palpitations but without associated symptoms. She declines screening tests like pap smears. Denies CP/palp/SOB/HA/congestion/fevers/GI or GU c/o. Taking meds as prescribed  Patient Care Team: Mosie Lukes, MD as PCP - General (Family Medicine)   Past Medical History:  Diagnosis Date  . Arthritis   . Atrial tachycardia ([place])    a. asymptomatic. Noted on tele during 11/2014 admission   . CAD (coronary artery disease)    a. 11/2014 inf STEMI s/p DES to RCA; 50-70% ruptured plaque in pLAD- rx medically  . GERD (gastroesophageal reflux disease)   . HTN (hypertension)   . Hyperglycemia   . Hyperlipidemia   . Hypothyroidism   . Ischemic cardiomyopathy    a. 11/2014  LV gram with inferior hypokinesis. EF 45-50%.  . Nocturia 01/08/2017  . Osteoporosis   . PAF (paroxysmal atrial fibrillation) ([place])    a. isolated episode of atrial fibrillation several years ago associated w/ her thyroid dysfunction (prior to ablation).   . Prediabetes     Past Surgical History:  Procedure Laterality Date  . BREAST SURGERY     breast biospy  . CATARACT EXTRACTION Bilateral   . COLONOSCOPY    . EYE SURGERY Bilateral 2005, 2006   Cataract with lens ImplaNT   . INGUINAL HERNIA REPAIR Right   . LEFT HEART CATHETERIZATION WITH CORONARY ANGIOGRAM N/A 12/02/2014   Procedure: LEFT HEART CATHETERIZATION WITH CORONARY ANGIOGRAM;  Surgeon: Sinclair Grooms, MD;  Location[facility] C[facility] LAB;  Service:  Cardiovascular;  Laterality: N/A;  . ORIF HUMERUS FRACTURE Right 01/27/2014   Procedure: RIGHT OPEN REDUCTION INTERNAL FIXATION (ORIF) PROXIMAL HUMERUS FRACTURE;  Surgeon: Rozanna Box, MD;  Location: [facility];  Service: Orthopedics;  Laterality: Right;  . TOTAL KNEE ARTHROPLASTY Right 2006  . TOTAL KNEE ARTHROPLASTY Left 2011  . UMBILICAL HERNIA REPAIR     as a baby    Family History  Problem Relation Age of Onset  . CAD Mother   . Heart disease Mother   . Heart failure Mother   . Polycythemia Father   . Multiple myeloma Father   . Prostate cancer Paternal Grandfather   . Breast cancer    . Arthritis Maternal Aunt   . Breast cancer Maternal Aunt   . Stroke Sister   . Hyperlipidemia Sister   . Hypertension Sister   . Heart disease Brother     bradycardia  . Hypertension Brother     Social History   Social History  . Marital status: Married    Spouse name: N/A  . Number of children: N/A  . Years of education: N/A   Occupational History  . retired     Social History Main Topics  . Smoking status: Never Smoker  . Smokeless tobacco: Never Used  . Alcohol use No  . Drug use: No  . Sexual activity: Not on file   Other Topics Concern  . Not on file   Social History Narrative  .  No narrative on file    Outpatient Medications Prior to Visit  Medication Sig Dispense Refill  . acetaminophen (TYLENOL) 500 MG tablet Take 1-2 tablets (500-1,000 mg total) by mouth every 6 (six) hours as needed for moderate pain or fever. 90 tablet 0  . apixaban (ELIQUIS) 5 MG TABS tablet Take 1 tablet (5 mg total) by mouth 2 (two) times daily. 180 tablet 3  . Calcium-Magnesium-Vitamin D (CALCIUM MAGNESIUM PO) Take 1 capsule by mouth daily.    . cetirizine (ZYRTEC) 10 MG tablet Take 10 mg by mouth at bedtime.    . Cholecalciferol (VITAMIN D) 2000 UNITS tablet Take 4,000 Units by mouth daily.    . Coenzyme Q10 200 MG TABS Take 200 mg by mouth daily.    Marland Kitchen LORazepam (ATIVAN) 0.5 MG tablet Take  0.5 mg by mouth at bedtime as needed for sleep.    . metoprolol tartrate (LOPRESSOR) 25 MG tablet TAKE 1 TABLET (25 MG TOTAL) BY MOUTH 2 (TWO) TIMES DAILY. 180 tablet 3  . nitroGLYCERIN (NITROSTAT) 0.4 MG SL tablet Place 1 tablet (0.4 mg total) under the tongue every 5 (five) minutes as needed for chest pain. 25 tablet 1  . rosuvastatin (CRESTOR) 40 MG tablet Take 20 mg by mouth daily.    . traMADol (ULTRAM) 50 MG tablet Take 50 mg by mouth every 6 (six) hours as needed. (PAIN)  4  . amoxicillin (AMOXIL) 500 MG capsule Take 4 capsules by mouth as needed. Take one (1) hour prior to dental procedures.  2  . levothyroxine (SYNTHROID, LEVOTHROID) 88 MCG tablet Take 88 mcg by mouth daily before breakfast.    . Melatonin 5 MG CAPS Take 5 mg by mouth at bedtime.     No facility-administered medications prior to visit.     Allergies  Allergen Reactions  . Keflex [Cephalexin] Rash    Review of Systems  Constitutional: Negative for fever and malaise/fatigue.  HENT: Negative for congestion.   Eyes: Negative for blurred vision.  Respiratory: Negative for cough and shortness of breath.   Cardiovascular: Negative for chest pain, palpitations and leg swelling.  Gastrointestinal: Negative for vomiting.  Musculoskeletal: Negative for back pain.  Skin: Negative for rash.  Neurological: Negative for loss of consciousness and headaches.       Objective:    Physical Exam  Constitutional: She is oriented to person, place, and time. She appears well-developed and well-nourished. No distress.  HENT:  Head: Normocephalic and atraumatic.  Eyes: Conjunctivae are normal.  Neck: Normal range of motion. No thyromegaly present.  Cardiovascular: Normal rate and regular rhythm.   Pulmonary/Chest: Effort normal and breath sounds normal. She has no wheezes.  Abdominal: Soft. Bowel sounds are normal. There is no tenderness.  Musculoskeletal: She exhibits no edema or deformity.  Neurological: She is alert and  oriented to person, place, and time.  Skin: Skin is warm and dry. She is not diaphoretic.  Psychiatric: She has a normal mood and affect.    BP 128/77 (BP Location: Left Arm, Patient Position: Sitting, Cuff Size: Large)   Pulse 64   Temp 98.1 F (36.7 C) (Oral)   Ht _0  (1.6 m)   Wt 204 lb 6.4 oz (92.7 kg)   SpO2 98% Comment: RA  BMI 36.21 kg/m  Wt Readings from Last 3 Encounters:  01/08/17 204 lb 6.4 oz (92.7 kg)  01/28/16 193 lb 12.8 oz (87.9 kg)  07/23/15 192 lb 6.4 oz (87.3 kg)   BP Readings from Last  3 Encounters:  01/08/17 128/77  01/28/16 114/76  07/23/15 114/70      There is no immunization history on file for this patient.  Health Maintenance  Topic Date Due  . TETANUS/TDAP  10/09/1960  . DEXA SCAN  10/09/2006  . PNA vac Low Risk Adult (1 of 2 - PCV13) 10/09/2006  . COLONOSCOPY  11/21/2026  . INFLUENZA VACCINE  Addressed    Lab Results  Component Value Date   WBC 5.0 01/08/2017   HGB 12.0 01/08/2017   HCT 36.1 01/08/2017   PLT 208.0 01/08/2017   GLUCOSE 93 01/08/2017   CHOL 182 01/08/2017   TRIG 95.0 01/08/2017   HDL 77.70 01/08/2017   LDLCALC 85 01/08/2017   ALT 16 01/08/2017   AST 26 01/08/2017   NA 138 01/08/2017   K 4.1 01/08/2017   CL 106 01/08/2017   CREATININE 1.20 01/08/2017   BUN 21 01/08/2017   CO2 23 01/08/2017   TSH 6.42 (H) 01/08/2017   INR 1.05 01/27/2014   HGBA1C 6.3 01/08/2017    Lab Results  Component Value Date   TSH 6.42 (H) 01/08/2017   Lab Results  Component Value Date   WBC 5.0 01/08/2017   HGB 12.0 01/08/2017   HCT 36.1 01/08/2017   MCV 95.8 01/08/2017   PLT 208.0 01/08/2017   Lab Results  Component Value Date   NA 138 01/08/2017   K 4.1 01/08/2017   CO2 23 01/08/2017   GLUCOSE 93 01/08/2017   BUN 21 01/08/2017   CREATININE 1.20 01/08/2017   BILITOT 0.6 01/08/2017   ALKPHOS 60 01/08/2017   AST 26 01/08/2017   ALT 16 01/08/2017   PROT 7.3 01/08/2017   ALBUMIN 4.1 01/08/2017   CALCIUM 9.8 01/08/2017     ANIONGAP 7 04/15/2015   GFR 46.52 (L) 01/08/2017   Lab Results  Component Value Date   CHOL 182 01/08/2017   Lab Results  Component Value Date   HDL 77.70 01/08/2017   Lab Results  Component Value Date   LDLCALC 85 01/08/2017   Lab Results  Component Value Date   TRIG 95.0 01/08/2017   Lab Results  Component Value Date   CHOLHDL 2 01/08/2017   Lab Results  Component Value Date   HGBA1C 6.3 01/08/2017         Assessment & Plan:   Problem List Items Addressed This Visit    Hypothyroidism    On Levothyroxine, continue to monitor      Hyperglycemia - Primary    minimize simple carbs. Increase exercise as tolerated.       HTN (hypertension)    Well controlled, no changes to meds. Encouraged heart healthy diet such as the DASH diet and exercise as tolerated.       Relevant Orders   CBC (Completed)   CBC   Comprehensive metabolic panel (Completed)   TSH (Completed)   Osteoporosis    Encouraged to get adequate exercise, calcium and vitamin d intake      Relevant Orders   Vitamin D (25 hydroxy) (Completed)   PAF (paroxysmal atrial fibrillation) (HCC)   Relevant Orders   CBC (Completed)   Nocturia    Check UA       Other Visit Diagnoses    Preventative health care       Relevant Orders   CBC (Completed)   Mixed hyperlipidemia       Relevant Orders   Lipid panel (Completed)      I have discontinued Ms.  Thomley's levothyroxine, amoxicillin, traMADol, and Melatonin. I am also having her maintain her cetirizine, Vitamin D, Calcium-Magnesium-Vitamin D (CALCIUM MAGNESIUM PO), acetaminophen, Coenzyme Q10, LORazepam, rosuvastatin, metoprolol tartrate, nitroGLYCERIN, and apixaban.  Meds ordered this encounter  Medications  . DISCONTD: levothyroxine (SYNTHROID, LEVOTHROID) 75 MCG tablet    Sig: Take 75 mcg by mouth every morning.    Refill:  11    CMA served as scribe during this visit. History, Physical and Plan performed by medical provider.  Documentation and orders reviewed and attested to.  Penni Homans, MD

## 2017-01-09 ENCOUNTER — Other Ambulatory Visit: Payer: Self-pay | Admitting: Family Medicine

## 2017-01-09 DIAGNOSIS — E039 Hypothyroidism, unspecified: Secondary | ICD-10-CM

## 2017-01-09 LAB — CBC
HEMATOCRIT: 36.1 % (ref 36.0–46.0)
HEMOGLOBIN: 12 g/dL (ref 12.0–15.0)
MCHC: 33.2 g/dL (ref 30.0–36.0)
MCV: 95.8 fl (ref 78.0–100.0)
Platelets: 208 10*3/uL (ref 150.0–400.0)
RBC: 3.77 Mil/uL — AB (ref 3.87–5.11)
RDW: 13.7 % (ref 11.5–15.5)
WBC: 5 10*3/uL (ref 4.0–10.5)

## 2017-01-09 LAB — COMPREHENSIVE METABOLIC PANEL
ALK PHOS: 60 U/L (ref 39–117)
ALT: 16 U/L (ref 0–35)
AST: 26 U/L (ref 0–37)
Albumin: 4.1 g/dL (ref 3.5–5.2)
BILIRUBIN TOTAL: 0.6 mg/dL (ref 0.2–1.2)
BUN: 21 mg/dL (ref 6–23)
CALCIUM: 9.8 mg/dL (ref 8.4–10.5)
CO2: 23 mEq/L (ref 19–32)
Chloride: 106 mEq/L (ref 96–112)
Creatinine, Ser: 1.2 mg/dL (ref 0.40–1.20)
GFR: 46.52 mL/min — AB (ref >60.00)
Glucose, Bld: 93 mg/dL (ref 70–99)
POTASSIUM: 4.1 meq/L (ref 3.5–5.1)
Sodium: 138 mEq/L (ref 135–145)
TOTAL PROTEIN: 7.3 g/dL (ref 6.0–8.3)

## 2017-01-09 LAB — VITAMIN D 25 HYDROXY (VIT D DEFICIENCY, FRACTURES): VITD: 44.08 ng/mL (ref 30.00–100.00)

## 2017-01-09 LAB — LIPID PANEL
Cholesterol: 182 mg/dL (ref 0–200)
HDL: 77.7 mg/dL (ref >39.00)
LDL Cholesterol: 85 mg/dL (ref 0–99)
NonHDL: 103.98
TRIGLYCERIDES: 95 mg/dL (ref 0.0–149.0)
Total CHOL/HDL Ratio: 2
VLDL: 19 mg/dL (ref 0.0–40.0)

## 2017-01-09 LAB — HEMOGLOBIN A1C: HEMOGLOBIN A1C: 6.3 % (ref 4.6–6.5)

## 2017-01-09 LAB — TSH: TSH: 6.42 u[IU]/mL — ABNORMAL HIGH (ref 0.35–4.50)

## 2017-01-09 MED ORDER — LEVOTHYROXINE SODIUM 88 MCG PO TABS: 88.0000 ug | ORAL_TABLET | Freq: Every day | ORAL | 3 refills | Status: DC

## 2017-01-13 ENCOUNTER — Other Ambulatory Visit: Payer: Self-pay | Admitting: Interventional Cardiology

## 2017-01-14 NOTE — Assessment & Plan Note (Signed)
Check UA 

## 2017-01-14 NOTE — Assessment & Plan Note (Signed)
minimize simple carbs. Increase exercise as tolerated.  

## 2017-01-14 NOTE — Assessment & Plan Note (Signed)
On Levothyroxine, continue to monitor 

## 2017-01-17 ENCOUNTER — Other Ambulatory Visit: Payer: Self-pay | Admitting: Interventional Cardiology

## 2017-01-17 DIAGNOSIS — I480 Paroxysmal atrial fibrillation: Secondary | ICD-10-CM

## 2017-01-17 DIAGNOSIS — I2510 Atherosclerotic heart disease of native coronary artery without angina pectoris: Secondary | ICD-10-CM

## 2017-01-17 DIAGNOSIS — R0683 Snoring: Secondary | ICD-10-CM

## 2017-01-17 DIAGNOSIS — I10 Essential (primary) hypertension: Secondary | ICD-10-CM

## 2017-01-17 DIAGNOSIS — I255 Ischemic cardiomyopathy: Secondary | ICD-10-CM

## 2017-01-18 ENCOUNTER — Encounter: Payer: Self-pay | Admitting: Interventional Cardiology

## 2017-01-30 NOTE — Progress Notes (Signed)
Cardiology Office Note    Date:  01/31/2017   ID:  Wendy Munoz, DOB 1941-10-22, MRN [id number]  PCP:  Penni Homans, MD  Cardiologist: Sinclair Grooms, MD   Chief Complaint  Patient presents with  . Coronary Artery Disease    History of Present Illness:  Wendy Munoz is a 76 y.o. female who presents for paroxysmal atrial fibrillation, acute inferior infarct April 2016 treated with proximal RCA DES, residual 60-75% segmental proximal LAD stenosis, hypertension, prediabetes, and chronic diastolic heart failure.  While stenosed she can come off of anticoagulation because of cost. She kept a good of symptomatic periods of atrial fibrillation over the past year. At least 7 episodes of atrial fibrillation lasting greater than an hour. She feels the frequency tends to correlate with thyroid replacement dose.  Recurring episodes of heartburn. States this is totally different than the discomfort she had during acute infarction. She denies exertion-related discomfort. She has not needed to use nitroglycerin.   Past Medical History:  Diagnosis Date  . Arthritis   . Atrial tachycardia ([place])    a. asymptomatic. Noted on tele during 11/2014 admission   . CAD (coronary artery disease)    a. 11/2014 inf STEMI s/p DES to RCA; 50-70% ruptured plaque in pLAD- rx medically  . GERD (gastroesophageal reflux disease)   . HTN (hypertension)   . Hyperglycemia   . Hyperlipidemia   . Hypothyroidism   . Ischemic cardiomyopathy    a. 11/2014  LV gram with inferior hypokinesis. EF 45-50%.  . Nocturia 01/08/2017  . Osteoporosis   . PAF (paroxysmal atrial fibrillation) ([place])    a. isolated episode of atrial fibrillation several years ago associated w/ her thyroid dysfunction (prior to ablation).   . Prediabetes     Past Surgical History:  Procedure Laterality Date  . BREAST SURGERY     breast biospy  . CATARACT EXTRACTION Bilateral   . COLONOSCOPY    . EYE SURGERY Bilateral 2005, 2006   Cataract with lens ImplaNT   . INGUINAL HERNIA REPAIR Right   . LEFT HEART CATHETERIZATION WITH CORONARY ANGIOGRAM N/A 12/02/2014   Procedure: LEFT HEART CATHETERIZATION WITH CORONARY ANGIOGRAM;  Surgeon: Sinclair Grooms, MD;  Location: [facility] CATH LAB;  Service: Cardiovascular;  Laterality: N/A;  . ORIF HUMERUS FRACTURE Right 01/27/2014   Procedure: RIGHT OPEN REDUCTION INTERNAL FIXATION (ORIF) PROXIMAL HUMERUS FRACTURE;  Surgeon: Rozanna Box, MD;  Location: [place];  Service: Orthopedics;  Laterality: Right;  . TOTAL KNEE ARTHROPLASTY Right 2006  . TOTAL KNEE ARTHROPLASTY Left 2011  . UMBILICAL HERNIA REPAIR     as a baby    Current Medications: Outpatient Medications Prior to Visit  Medication Sig Dispense Refill  . acetaminophen (TYLENOL) 500 MG tablet Take 1-2 tablets (500-1,000 mg total) by mouth every 6 (six) hours as needed for moderate pain or fever. 90 tablet 0  . apixaban (ELIQUIS) 5 MG TABS tablet Take 1 tablet (5 mg total) by mouth 2 (two) times daily. 180 tablet 3  . Calcium-Magnesium-Vitamin D (CALCIUM MAGNESIUM PO) Take 1 capsule by mouth daily.    . cetirizine (ZYRTEC) 10 MG tablet Take 10 mg by mouth at bedtime.    . Cholecalciferol (VITAMIN D) 2000 UNITS tablet Take 4,000 Units by mouth daily.    . Coenzyme Q10 200 MG TABS Take 200 mg by mouth daily.    Marland Kitchen levothyroxine (SYNTHROID, LEVOTHROID) 88 MCG tablet Take 1 tablet (88 mcg total) by mouth daily.  30 tablet 3  . LORazepam (ATIVAN) 0.5 MG tablet Take 0.5 mg by mouth at bedtime as needed for sleep.    . metoprolol tartrate (LOPRESSOR) 25 MG tablet TAKE 1 TABLET (25 MG TOTAL) BY MOUTH 2 (TWO) TIMES DAILY. 180 tablet 3  . nitroGLYCERIN (NITROSTAT) 0.4 MG SL tablet PLACE 1 TABLET (0.4 MG TOTAL) UNDER THE TONGUE EVERY 5 (FIVE) MINUTES AS NEEDED FOR CHEST PAIN. 25 tablet 0  . rosuvastatin (CRESTOR) 40 MG tablet Take 20 mg by mouth daily.     No facility-administered medications prior to visit.      Allergies:   Keflex  [cephalexin]   Social History   Social History  . Marital status: Married    Spouse name: N/A  . Number of children: N/A  . Years of education: N/A   Occupational History  . retired     Social History Main Topics  . Smoking status: Never Smoker  . Smokeless tobacco: Never Used  . Alcohol use No  . Drug use: No  . Sexual activity: Not Asked   Other Topics Concern  . None   Social History Narrative  . None     Family History:  The patient's family history includes Arthritis in her maternal aunt; Breast cancer in her maternal aunt; CAD in her mother; Heart disease in her brother and mother; Heart failure in her mother; Hyperlipidemia in her sister; Hypertension in her brother and sister; Multiple myeloma in her father; Polycythemia in her father; Prostate cancer in her paternal grandfather; Stroke in her sister.   ROS:   Please see the history of present illness.    Easy bruising, excessive fatigue. Not following her diet. All other systems reviewed and are negative.   PHYSICAL EXAM:   VS:  BP 124/76 (BP Location: Left Arm)   Pulse (!) 57   Ht 5' 2"  (1.575 m)   Wt 208 lb 6.4 oz (94.5 kg)   BMI 38.12 kg/m    GEN: Well nourished, well developed, in no acute distress . Morbid obesity. HEENT: normal  Neck: no JVD, carotid bruits, or masses Cardiac: RRR; no murmurs, rubs, or gallops,no edema  Respiratory:  clear to auscultation bilaterally, normal work of breathing GI: soft, nontender, nondistended, + BS MS: no deformity or atrophy  Skin: warm and dry, no rash Neuro:  Alert and Oriented x 3, Strength and sensation are intact Psych: euthymic mood, full affect  Wt Readings from Last 3 Encounters:  01/31/17 208 lb 6.4 oz (94.5 kg)  01/08/17 204 lb 6.4 oz (92.7 kg)  01/28/16 193 lb 12.8 oz (87.9 kg)      Studies/Labs Reviewed:   EKG:  EKG  Sinus bradycardia. Nonspecific T wave flattening.  Recent Labs: 01/08/2017: ALT 16; BUN 21; Creatinine, Ser 1.20; Hemoglobin  12.0; Platelets 208.0; Potassium 4.1; Sodium 138; TSH 6.42   Lipid Panel    Component Value Date/Time   CHOL 182 01/08/2017 1536   TRIG 95.0 01/08/2017 1536   HDL 77.70 01/08/2017 1536   CHOLHDL 2 01/08/2017 1536   VLDL 19.0 01/08/2017 1536   LDLCALC 85 01/08/2017 1536    Additional studies/ records that were reviewed today include:  Reviewed digital images from February 2016 coronary angiogram. There is impressive segmental ostial to proximal LAD narrowing with ulcer.    ASSESSMENT:    1. PAF (paroxysmal atrial fibrillation) ([place])   2. Coronary artery disease involving native coronary artery of native heart without angina pectoris   3. Essential hypertension  4. Hyperlipidemia LDL goal <70   5. Ischemic cardiomyopathy      PLAN:  In order of problems listed above:  1. Currently stable and in sinus bradycardia on today's EKG. Needs to continue chronic anticoagulation therapy as her Chads Vasc is greater than 2. 2. Myocardial perfusion imaging with pharmacologic stress to rule out progression of LAD disease. 3. Weight loss, 2 g sodium diet, and continue current medical regimen. 4. LDL is 85. Target is less than 70. Discussed uptitrating rosuvastatin. She refuses. Encouraged patient to seriously tighten up on her diet, exercise, and efforts towards weight reduction.  Clinical follow-up in one year unless significant ischemia is noted on the myocardial perfusion study.  Medication Adjustments/Labs and Tests Ordered: Current medicines are reviewed at length with the patient today.  Concerns regarding medicines are outlined above.  Medication changes, Labs and Tests ordered today are listed in the Patient Instructions below. Patient Instructions  Medication Instructions:  None  Labwork: None  Testing/Procedures: Your physician has requested that you have a lexiscan myoview. For further information please visit [email or web address]. Please follow instruction sheet, as  given.   Follow-Up: Your physician wants you to follow-up in: 1 year with Dr. Tamala Julian. You will receive a reminder letter in the mail two months in advance. If you don't receive a letter, please call our office to schedule the follow-up appointment.   Any Other Special Instructions Will Be Listed Below (If Applicable).  Dr. Tamala Julian has recommended that you improve your diet and exercise.    If you need a refill on your cardiac medications before your next appointment, please call your pharmacy.      Signed, Sinclair Grooms, MD  01/31/2017 1:46 PM    [facility] [place], [place], [place]  [address] Phone: [phone]; Fax: [phone]

## 2017-01-31 ENCOUNTER — Ambulatory Visit (INDEPENDENT_AMBULATORY_CARE_PROVIDER_SITE_OTHER): Payer: PPO | Admitting: Interventional Cardiology

## 2017-01-31 ENCOUNTER — Encounter: Payer: Self-pay | Admitting: Interventional Cardiology

## 2017-01-31 VITALS — BP 124/76 | HR 57 | Ht 62.0 in | Wt 208.4 lb

## 2017-01-31 DIAGNOSIS — I10 Essential (primary) hypertension: Secondary | ICD-10-CM

## 2017-01-31 DIAGNOSIS — I2510 Atherosclerotic heart disease of native coronary artery without angina pectoris: Secondary | ICD-10-CM

## 2017-01-31 DIAGNOSIS — I480 Paroxysmal atrial fibrillation: Secondary | ICD-10-CM

## 2017-01-31 DIAGNOSIS — E785 Hyperlipidemia, unspecified: Secondary | ICD-10-CM | POA: Diagnosis not present

## 2017-01-31 DIAGNOSIS — R0683 Snoring: Secondary | ICD-10-CM

## 2017-01-31 DIAGNOSIS — I255 Ischemic cardiomyopathy: Secondary | ICD-10-CM

## 2017-01-31 MED ORDER — APIXABAN 5 MG PO TABS: 5.0000 mg | ORAL_TABLET | Freq: Two times a day (BID) | ORAL | 3 refills | Status: DC

## 2017-01-31 NOTE — Patient Instructions (Signed)
Medication Instructions:  None  Labwork: None  Testing/Procedures: Your physician has requested that you have a lexiscan myoview. For further information please visit [email or web address]. Please follow instruction sheet, as given.   Follow-Up: Your physician wants you to follow-up in: 1 year with Dr. Tamala Julian. You will receive a reminder letter in the mail two months in advance. If you don't receive a letter, please call our office to schedule the follow-up appointment.   Any Other Special Instructions Will Be Listed Below (If Applicable).  Dr. Tamala Julian has recommended that you improve your diet and exercise.    If you need a refill on your cardiac medications before your next appointment, please call your pharmacy.

## 2017-02-05 ENCOUNTER — Other Ambulatory Visit: Payer: Self-pay | Admitting: Family Medicine

## 2017-02-05 MED ORDER — LEVOTHYROXINE SODIUM 88 MCG PO TABS: 88.0000 ug | ORAL_TABLET | Freq: Every day | ORAL | 1 refills | Status: DC

## 2017-02-15 ENCOUNTER — Telehealth: Payer: Self-pay | Admitting: Family Medicine

## 2017-02-15 ENCOUNTER — Telehealth: Payer: Self-pay | Admitting: Interventional Cardiology

## 2017-02-15 NOTE — Telephone Encounter (Signed)
Patient Name: Wendy Munoz  Gender: Female  DOB: 1941-04-22   Age: 76 Y 94 M 8 D  Return Phone Number: [phone] (Primary)  Address:   City/State/Zip: [place]    [facility] - Client  Client Site [facility] - Day  Physician Penni Homans - MD  Contact Type Call  Who Is Calling Patient / Member / Family / Caregiver  Call Type Triage / Clinical  Relationship To Patient Self  Return Phone Number [phone] (Primary)  Chief Complaint Vaginal Bleeding  Reason for Call Symptomatic / Request for [place] is having vaginal bleeding, is on blood thinner Eloquis, has been having it since this afternoon. Is wondering what to do for it.   PreDisposition Go to ED  Translation No   Nurse Assessment  Nurse: Genoveva Ill, RN, Lattie Haw Date/Time (Eastern Time): 02/15/2017 4:22:04 PM  Confirm and document reason for call. If symptomatic, describe symptoms. ---Caller states is having vaginal bleeding, is on blood thinner Eloquis; noticed some spotting on underwear and then went to bathroom and toilet water was bright red; cardiologist office said to call PCP to be seen tomorrow  Does the patient have any new or worsening symptoms? ---Yes  Will a triage be completed? ---Yes  Related visit to physician within the last 2 weeks? ---No  Does the PT have any chronic conditions? (i.e. diabetes, asthma, etc.) ---Yes  List chronic conditions. ---hx MI, afib  Is this a behavioral health or substance abuse call? ---No     Guidelines      Guideline Title Affirmed Question Affirmed Notes Nurse Date/Time (Eastern Time)  Vaginal Bleeding - Postmenopausal Taking Coumadin (warfarin) or other strong blood thinner, or known bleeding disorder (e.g., thrombocytopenia)  Burress, RN, Lattie Haw 02/15/2017 4:25:23 PM   Disp. Time Eilene Ghazi Time) Disposition Final User          02/15/2017 4:38:03 PM See Physician within 24 Hours Yes Burress, RN, Leland Johns Understands: Yes  Disagree/Comply: Comply     Care Advice Given Per Guideline    SEE PHYSICIAN WITHIN 24 HOURS: * IF OFFICE WILL BE OPEN: You need to be seen within the next 24 hours. Call your doctor when the office opens, and make an appointment. CALL BACK IF: * Severe abdominal pain or dizziness occurs * Bleeding increases * You become worse. CARE ADVICE given per Vaginal Bleeding, Postmenopausal (Adult) guideline.     Comments  User: Margaretha Sheffield, RN Date/Time Eilene Ghazi Time): 02/15/2017 4:37:57 PM  no appt available with PCP; appt scheduled 02/16/17 at 2:45pm with Dr. Etter Sjogren   Referrals  REFERRED TO PCP OFFICE

## 2017-02-15 NOTE — Telephone Encounter (Signed)
Spoke with pt and advised her of recommendations per Dr. Tamala Julian.  Pt verbalized understanding and was appreciative for call.  Pt has appt at Dr. Azalee Course office tomorrow to see Dr. Etter Sjogren.

## 2017-02-15 NOTE — Telephone Encounter (Signed)
New Message   pt verbalized that she is calling for rn   She is on eliquis and she is bleeding in her vaginal area

## 2017-02-15 NOTE — Telephone Encounter (Signed)
Pt states that when she went to the bathroom she noticed that she had a small amount of blood on a pad that she was wearing for incontinence.  Pt states after she urinated she did notice a large amount of blood in the toilet with a pink fingernail size clot.  Has been slightly fatigued today.  Advised pt to call PCP or GYN in regards to bleeding as it may not be related to Eliquis.  Advised I will also speak with Dr. Tamala Julian and call back.  Spoke with Dr. Tamala Julian and he said ok to hold Eliquis x 24 hrs and agreed that pt also needed to call PCP or GYN.  Attempted to call pt.  Left message to call back

## 2017-02-15 NOTE — Telephone Encounter (Signed)
Pt called in to be advised by a nurse or PCP. Pt's concern is that she had a blood clot in her stool. Transferred pt to [facility] for triaging.

## 2017-02-16 ENCOUNTER — Ambulatory Visit (INDEPENDENT_AMBULATORY_CARE_PROVIDER_SITE_OTHER): Payer: PPO | Admitting: Family Medicine

## 2017-02-16 VITALS — BP 110/60 | HR 69 | Temp 97.6°F | Resp 16 | Ht 62.0 in | Wt 204.2 lb

## 2017-02-16 DIAGNOSIS — I10 Essential (primary) hypertension: Secondary | ICD-10-CM

## 2017-02-16 DIAGNOSIS — R350 Frequency of micturition: Secondary | ICD-10-CM | POA: Diagnosis not present

## 2017-02-16 DIAGNOSIS — E039 Hypothyroidism, unspecified: Secondary | ICD-10-CM | POA: Diagnosis not present

## 2017-02-16 DIAGNOSIS — N390 Urinary tract infection, site not specified: Secondary | ICD-10-CM

## 2017-02-16 DIAGNOSIS — N939 Abnormal uterine and vaginal bleeding, unspecified: Secondary | ICD-10-CM

## 2017-02-16 DIAGNOSIS — R319 Hematuria, unspecified: Secondary | ICD-10-CM | POA: Diagnosis not present

## 2017-02-16 LAB — CBC WITH DIFFERENTIAL/PLATELET
Basophils Absolute: 52 cells/uL (ref 0–200)
Basophils Relative: 1 %
EOS PCT: 2 %
Eosinophils Absolute: 104 cells/uL (ref 15–500)
HCT: 34.6 % — ABNORMAL LOW (ref 35.0–45.0)
Hemoglobin: 11.4 g/dL — ABNORMAL LOW (ref 11.7–15.5)
LYMPHS PCT: 36 %
Lymphs Abs: 1872 cells/uL (ref 850–3900)
MCH: 31.4 pg (ref 27.0–33.0)
MCHC: 32.9 g/dL (ref 32.0–36.0)
MCV: 95.3 fL (ref 80.0–100.0)
MONO ABS: 572 {cells}/uL (ref 200–950)
MPV: 9.8 fL (ref 7.5–12.5)
Monocytes Relative: 11 %
NEUTROS PCT: 50 %
Neutro Abs: 2600 cells/uL (ref 1500–7800)
Platelets: 221 10*3/uL (ref 140–400)
RBC: 3.63 MIL/uL — AB (ref 3.80–5.10)
RDW: 13.3 % (ref 11.0–15.0)
WBC: 5.2 10*3/uL (ref 3.8–10.8)

## 2017-02-16 LAB — POCT URINALYSIS DIPSTICK
BILIRUBIN UA: NEGATIVE
Glucose, UA: NEGATIVE
Ketones, UA: NEGATIVE
NITRITE UA: NEGATIVE
Spec Grav, UA: >=1.03 — AB (ref 1.010–1.025)
Urobilinogen, UA: NEGATIVE E.U./dL — AB
pH, UA: 5.5 (ref 5.0–8.0)

## 2017-02-16 NOTE — Progress Notes (Signed)
Pre visit review using our clinic review tool, if applicable. No additional management support is needed unless otherwise documented below in the visit note. 

## 2017-02-16 NOTE — Patient Instructions (Signed)
Postmenopausal Bleeding Postmenopausal bleeding is any bleeding a woman has after she has entered into menopause. Menopause is the end of a woman's fertile years. After menopause, a woman no longer ovulates or has menstrual periods. Postmenopausal bleeding can be caused by various things. Any type of postmenopausal bleeding, even if it appears to be a typical menstrual period, is concerning. This should be evaluated by your health care provider. Any treatment will depend on the cause of the bleeding. Follow these instructions at home: Monitor your condition for any changes. The following actions may help to alleviate any discomfort you are experiencing:  Avoid the use of tampons and douches as directed by your health care provider.  Change your pads frequently.  Get regular pelvic exams and Pap tests.  Keep all follow-up appointments for diagnostic tests as directed by your health care provider. Contact a health care provider if:  Your bleeding lasts more than 1 week.  You have abdominal pain.  You have bleeding with sexual intercourse. Get help right away if:  You have a fever, chills, headache, dizziness, muscle aches, and bleeding.  You have severe pain with bleeding.  You are passing blood clots.  You have bleeding and need more than 1 pad an hour.  You feel faint. This information is not intended to replace advice given to you by your health care provider. Make sure you discuss any questions you have with your health care provider. Document Released: 01/17/2006 Document Revised: 03/16/2016 Document Reviewed: 05/08/2013 Elsevier Interactive Patient Education  2017 [employer].

## 2017-02-16 NOTE — Progress Notes (Signed)
Subjective:  I acted as a Education administrator for Dr. Royden Purl, LPN    Patient ID: Jearld Lesch, female    DOB: 03/03/1941, 76 y.o.   MRN: [id number]  Chief Complaint  Patient presents with  . Vaginal Bleeding    HPI  Patient is in today for postmenopausal bleeding. Pt report she went to the bathroom she had bright after urinating and a clot yesterday. Pt report this the first time this happened. Pt report she is no longer bleeding.  Pt also c/o low abd pressure and frequent urination.  No dysuria. Pt still feels weak from yesterday.  Patient Care Team: Mosie Lukes, MD as PCP - General (Family Medicine)   Past Medical History:  Diagnosis Date  . Arthritis   . Atrial tachycardia ([place])    a. asymptomatic. Noted on tele during 11/2014 admission   . CAD (coronary artery disease)    a. 11/2014 inf STEMI s/p DES to RCA; 50-70% ruptured plaque in pLAD- rx medically  . GERD (gastroesophageal reflux disease)   . HTN (hypertension)   . Hyperglycemia   . Hyperlipidemia   . Hypothyroidism   . Ischemic cardiomyopathy    a. 11/2014  LV gram with inferior hypokinesis. EF 45-50%.  . Nocturia 01/08/2017  . Osteoporosis   . PAF (paroxysmal atrial fibrillation) ([place])    a. isolated episode of atrial fibrillation several years ago associated w/ her thyroid dysfunction (prior to ablation).   . Prediabetes     Past Surgical History:  Procedure Laterality Date  . BREAST SURGERY     breast biospy  . CATARACT EXTRACTION Bilateral   . COLONOSCOPY    . EYE SURGERY Bilateral 2005, 2006   Cataract with lens ImplaNT   . INGUINAL HERNIA REPAIR Right   . LEFT HEART CATHETERIZATION WITH CORONARY ANGIOGRAM N/A 12/02/2014   Procedure: LEFT HEART CATHETERIZATION WITH CORONARY ANGIOGRAM;  Surgeon: Sinclair Grooms, MD;  Location: [facility] CATH LAB;  Service: Cardiovascular;  Laterality: N/A;  . ORIF HUMERUS FRACTURE Right 01/27/2014   Procedure: RIGHT OPEN REDUCTION INTERNAL FIXATION (ORIF) PROXIMAL HUMERUS  FRACTURE;  Surgeon: Rozanna Box, MD;  Location: [facility];  Service: Orthopedics;  Laterality: Right;  . TOTAL KNEE ARTHROPLASTY Right 2006  . TOTAL KNEE ARTHROPLASTY Left 2011  . UMBILICAL HERNIA REPAIR     as a baby    Family History  Problem Relation Age of Onset  . CAD Mother   . Heart disease Mother   . Heart failure Mother   . Polycythemia Father   . Multiple myeloma Father   . Prostate cancer Paternal Grandfather   . Breast cancer    . Arthritis Maternal Aunt   . Breast cancer Maternal Aunt   . Stroke Sister   . Hyperlipidemia Sister   . Hypertension Sister   . Heart disease Brother     bradycardia  . Hypertension Brother     Social History   Social History  . Marital status: Married    Spouse name: N/A  . Number of children: N/A  . Years of education: N/A   Occupational History  . retired     Social History Main Topics  . Smoking status: Never Smoker  . Smokeless tobacco: Never Used  . Alcohol use No  . Drug use: No  . Sexual activity: Not on file   Other Topics Concern  . Not on file   Social History Narrative  . No narrative on file  Outpatient Medications Prior to Visit  Medication Sig Dispense Refill  . acetaminophen (TYLENOL) 500 MG tablet Take 1-2 tablets (500-1,000 mg total) by mouth every 6 (six) hours as needed for moderate pain or fever. 90 tablet 0  . amoxicillin (AMOXIL) 500 MG tablet Take 4 tablets by mouth as needed. (Take one (1) hour prior to dental procedures)  3  . apixaban (ELIQUIS) 5 MG TABS tablet Take 1 tablet (5 mg total) by mouth 2 (two) times daily. 180 tablet 3  . B Complex Vitamins (B COMPLEX PO) Take 1 capsule by mouth daily.    . Calcium-Magnesium-Vitamin D (CALCIUM MAGNESIUM PO) Take 1 capsule by mouth daily.    . cetirizine (ZYRTEC) 10 MG tablet Take 10 mg by mouth at bedtime.    . Cholecalciferol (VITAMIN D) 2000 UNITS tablet Take 4,000 Units by mouth daily.    . Coenzyme Q10 200 MG TABS Take 200 mg by mouth daily.     Marland Kitchen levothyroxine (SYNTHROID, LEVOTHROID) 88 MCG tablet Take 1 tablet (88 mcg total) by mouth daily. 90 tablet 1  . LORazepam (ATIVAN) 0.5 MG tablet Take 0.5 mg by mouth at bedtime as needed for sleep.    . metoprolol tartrate (LOPRESSOR) 25 MG tablet TAKE 1 TABLET (25 MG TOTAL) BY MOUTH 2 (TWO) TIMES DAILY. 180 tablet 3  . nitroGLYCERIN (NITROSTAT) 0.4 MG SL tablet PLACE 1 TABLET (0.4 MG TOTAL) UNDER THE TONGUE EVERY 5 (FIVE) MINUTES AS NEEDED FOR CHEST PAIN. 25 tablet 0  . rosuvastatin (CRESTOR) 40 MG tablet Take 20 mg by mouth daily.     No facility-administered medications prior to visit.     Allergies  Allergen Reactions  . Keflex [Cephalexin] Rash    Review of Systems  Constitutional: Negative for chills, fever and malaise/fatigue.  HENT: Negative for congestion and hearing loss.   Eyes: Negative for discharge.  Respiratory: Negative for cough, sputum production and shortness of breath.   Cardiovascular: Negative for chest pain, palpitations and leg swelling.  Gastrointestinal: Negative for abdominal pain, blood in stool, constipation, diarrhea, heartburn, nausea and vomiting.  Genitourinary: Negative for dysuria, frequency, hematuria and urgency.  Musculoskeletal: Negative for back pain, falls and myalgias.  Skin: Negative for rash.  Neurological: Negative for dizziness, sensory change, loss of consciousness, weakness and headaches.  Endo/Heme/Allergies: Negative for environmental allergies. Does not bruise/bleed easily.  Psychiatric/Behavioral: Negative for depression and suicidal ideas. The patient is not nervous/anxious and does not have insomnia.        Objective:    Physical Exam  Constitutional: She is oriented to person, place, and time. She appears well-developed and well-nourished.  HENT:  Head: Normocephalic and atraumatic.  Eyes: Conjunctivae and EOM are normal.  Neck: Normal range of motion. Neck supple. No JVD present. Carotid bruit is not present. No  thyromegaly present.  Cardiovascular: Normal rate, regular rhythm and normal heart sounds.   No murmur heard. Pulmonary/Chest: Effort normal and breath sounds normal. No respiratory distress. She has no wheezes. She has no rales. She exhibits no tenderness.  Abdominal: Soft. Bowel sounds are normal. She exhibits no distension. There is no tenderness. There is no rebound.  Genitourinary: Vagina normal and uterus normal. No vaginal discharge found.  Musculoskeletal: She exhibits no edema.  Neurological: She is alert and oriented to person, place, and time.  Psychiatric: She has a normal mood and affect.  Nursing note and vitals reviewed.   BP 110/60 (BP Location: Left Arm, Patient Position: Sitting, Cuff Size: Normal)  Pulse 69   Temp 97.6 F (36.4 C) (Oral)   Resp 16   Ht 5' 2"  (1.575 m)   Wt 204 lb 3.2 oz (92.6 kg)   SpO2 98%   BMI 37.35 kg/m  Wt Readings from Last 3 Encounters:  02/16/17 204 lb 3.2 oz (92.6 kg)  01/31/17 208 lb 6.4 oz (94.5 kg)  01/08/17 204 lb 6.4 oz (92.7 kg)   BP Readings from Last 3 Encounters:  02/16/17 110/60  01/31/17 124/76  01/08/17 128/77      There is no immunization history on file for this patient.  Health Maintenance  Topic Date Due  . TETANUS/TDAP  10/09/1960  . DEXA SCAN  10/09/2006  . PNA vac Low Risk Adult (1 of 2 - PCV13) 10/09/2006  . INFLUENZA VACCINE  05/23/2017  . COLONOSCOPY  11/21/2026    Lab Results  Component Value Date   WBC 5.2 02/16/2017   HGB 11.4 (L) 02/16/2017   HCT 34.6 (L) 02/16/2017   PLT 221 02/16/2017   GLUCOSE 93 01/08/2017   CHOL 182 01/08/2017   TRIG 95.0 01/08/2017   HDL 77.70 01/08/2017   LDLCALC 85 01/08/2017   ALT 16 01/08/2017   AST 26 01/08/2017   NA 138 01/08/2017   K 4.1 01/08/2017   CL 106 01/08/2017   CREATININE 1.20 01/08/2017   BUN 21 01/08/2017   CO2 23 01/08/2017   TSH 6.42 (H) 01/08/2017   INR 1.05 01/27/2014   HGBA1C 6.3 01/08/2017    Lab Results  Component Value Date    TSH 6.42 (H) 01/08/2017   Lab Results  Component Value Date   WBC 5.2 02/16/2017   HGB 11.4 (L) 02/16/2017   HCT 34.6 (L) 02/16/2017   MCV 95.3 02/16/2017   PLT 221 02/16/2017   Lab Results  Component Value Date   NA 138 01/08/2017   K 4.1 01/08/2017   CO2 23 01/08/2017   GLUCOSE 93 01/08/2017   BUN 21 01/08/2017   CREATININE 1.20 01/08/2017   BILITOT 0.6 01/08/2017   ALKPHOS 60 01/08/2017   AST 26 01/08/2017   ALT 16 01/08/2017   PROT 7.3 01/08/2017   ALBUMIN 4.1 01/08/2017   CALCIUM 9.8 01/08/2017   ANIONGAP 7 04/15/2015   GFR 46.52 (L) 01/08/2017   Lab Results  Component Value Date   CHOL 182 01/08/2017   Lab Results  Component Value Date   HDL 77.70 01/08/2017   Lab Results  Component Value Date   LDLCALC 85 01/08/2017   Lab Results  Component Value Date   TRIG 95.0 01/08/2017   Lab Results  Component Value Date   CHOLHDL 2 01/08/2017   Lab Results  Component Value Date   HGBA1C 6.3 01/08/2017         Assessment & Plan:   Problem List Items Addressed This Visit      Unprioritized   HTN (hypertension) - Primary   Hypothyroidism   Hematuria    Check urine culture Treat like uti        Other Visit Diagnoses    Urinary frequency       Relevant Orders   POCT Urinalysis Dipstick (Completed)   Urine Culture (Completed)      I am having Ms. Medders maintain her cetirizine, Vitamin D, Calcium-Magnesium-Vitamin D (CALCIUM MAGNESIUM PO), acetaminophen, Coenzyme Q10, LORazepam, rosuvastatin, nitroGLYCERIN, metoprolol tartrate, amoxicillin, B Complex Vitamins (B COMPLEX PO), apixaban, and levothyroxine.  No orders of the defined types were placed in this encounter.  CMA served as Education administrator during this visit. History, Physical and Plan performed by medical provider. Documentation and orders reviewed and attested to.  Ann Held, DO   Patient ID: Jearld Lesch, female   DOB: 01-03-1941, 76 y.o.   MRN: [id number]

## 2017-02-18 ENCOUNTER — Encounter: Payer: Self-pay | Admitting: Family Medicine

## 2017-02-18 DIAGNOSIS — R319 Hematuria, unspecified: Secondary | ICD-10-CM | POA: Insufficient documentation

## 2017-02-18 HISTORY — DX: Hematuria, unspecified: R31.9

## 2017-02-18 MED ORDER — CIPROFLOXACIN HCL 250 MG PO TABS: 250.0000 mg | ORAL_TABLET | Freq: Two times a day (BID) | ORAL | 0 refills | Status: DC

## 2017-02-18 NOTE — Assessment & Plan Note (Addendum)
Check urine culture Treat like uti

## 2017-02-19 ENCOUNTER — Other Ambulatory Visit: Payer: Self-pay | Admitting: Family Medicine

## 2017-02-19 DIAGNOSIS — N390 Urinary tract infection, site not specified: Secondary | ICD-10-CM

## 2017-02-19 LAB — URINE CULTURE

## 2017-03-02 ENCOUNTER — Other Ambulatory Visit (INDEPENDENT_AMBULATORY_CARE_PROVIDER_SITE_OTHER): Payer: PPO

## 2017-03-02 DIAGNOSIS — N390 Urinary tract infection, site not specified: Secondary | ICD-10-CM | POA: Diagnosis not present

## 2017-03-02 NOTE — Addendum Note (Signed)
Addended by: Caffie Pinto on: 03/02/2017 11:04 AM   Modules accepted: Orders

## 2017-03-03 LAB — URINALYSIS
Bilirubin Urine: NEGATIVE
Glucose, UA: NEGATIVE
Hgb urine dipstick: NEGATIVE
Ketones, ur: NEGATIVE
LEUKOCYTES UA: NEGATIVE
NITRITE: NEGATIVE
PH: 5.5 (ref 5.0–8.0)
Specific Gravity, Urine: 1.024 (ref 1.001–1.035)

## 2017-03-04 LAB — URINE CULTURE

## 2017-03-07 ENCOUNTER — Telehealth: Payer: Self-pay | Admitting: Interventional Cardiology

## 2017-03-07 NOTE — Telephone Encounter (Signed)
Pt has been having increased swelling in feet in ankles since about 2 days prior to leaving for her trip to [place].  Pt states she was up on her feet more those days prepping for the trip.  Pt states they did a lot of eating out on the trip and was up on her feet more while gone.  Swelling improves with elevation.   Denies SOB.  Advised pt to prop feet up as much as she can and wear compression stockings if possible.  Pt does not wish to wear compression stockings.  Advised if no improvement by Monday to give me a call back.  Pt verbalized understanding and was in agreement with this plan.

## 2017-03-07 NOTE — Telephone Encounter (Signed)
New message    Pt c/o swelling: STAT is pt has developed SOB within 24 hours  1. How long have you been experiencing swelling? 4 days  2. Where is the swelling located? Ankles and tops of feet  3.  Are you currently taking a "fluid pill"? no   4.  Are you currently SOB? no  5.  Have you traveled recently? Yes went to [place] on Tuesday, came back today.

## 2017-03-20 ENCOUNTER — Ambulatory Visit (INDEPENDENT_AMBULATORY_CARE_PROVIDER_SITE_OTHER): Payer: PPO | Admitting: Interventional Cardiology

## 2017-03-20 ENCOUNTER — Telehealth: Payer: Self-pay | Admitting: Interventional Cardiology

## 2017-03-20 ENCOUNTER — Encounter: Payer: Self-pay | Admitting: Interventional Cardiology

## 2017-03-20 VITALS — BP 132/70 | HR 66 | Ht 62.0 in | Wt 203.8 lb

## 2017-03-20 DIAGNOSIS — R55 Syncope and collapse: Secondary | ICD-10-CM

## 2017-03-20 DIAGNOSIS — I10 Essential (primary) hypertension: Secondary | ICD-10-CM

## 2017-03-20 DIAGNOSIS — I255 Ischemic cardiomyopathy: Secondary | ICD-10-CM

## 2017-03-20 DIAGNOSIS — I2510 Atherosclerotic heart disease of native coronary artery without angina pectoris: Secondary | ICD-10-CM | POA: Diagnosis not present

## 2017-03-20 DIAGNOSIS — I480 Paroxysmal atrial fibrillation: Secondary | ICD-10-CM

## 2017-03-20 LAB — BASIC METABOLIC PANEL
BUN/Creatinine Ratio: 13 (ref 12–28)
BUN: 15 mg/dL (ref 8–27)
CALCIUM: 9.5 mg/dL (ref 8.7–10.3)
CHLORIDE: 104 mmol/L (ref 96–106)
CO2: 26 mmol/L (ref 18–29)
Creatinine, Ser: 1.15 mg/dL — ABNORMAL HIGH (ref 0.57–1.00)
GFR calc Af Amer: 54 mL/min/{1.73_m2} — ABNORMAL LOW (ref >59)
GFR calc non Af Amer: 47 mL/min/{1.73_m2} — ABNORMAL LOW (ref >59)
Glucose: 118 mg/dL — ABNORMAL HIGH (ref 65–99)
Potassium: 4.2 mmol/L (ref 3.5–5.2)
Sodium: 135 mmol/L (ref 134–144)

## 2017-03-20 LAB — TROPONIN T: Troponin T TROPT: <0.011 ng/mL (ref <0.011)

## 2017-03-20 LAB — CBC
HEMATOCRIT: 35.6 % (ref 34.0–46.6)
HEMOGLOBIN: 12.1 g/dL (ref 11.1–15.9)
MCH: 30.9 pg (ref 26.6–33.0)
MCHC: 34 g/dL (ref 31.5–35.7)
MCV: 91 fL (ref 79–97)
Platelets: 200 10*3/uL (ref 150–379)
RBC: 3.91 x10E6/uL (ref 3.77–5.28)
RDW: 13.1 % (ref 12.3–15.4)
WBC: 5.7 10*3/uL (ref 3.4–10.8)

## 2017-03-20 NOTE — Telephone Encounter (Signed)
Patient has appointment with Dr. Tamala Julian today.

## 2017-03-20 NOTE — Progress Notes (Signed)
Cardiology Office Note    Date:  03/20/2017   ID:  Wendy, Munoz September 04, 1941, MRN [id number]  PCP:  Mosie Lukes, MD  Cardiologist: Sinclair Grooms, MD   Chief Complaint  Patient presents with  . Coronary Artery Disease  . Atrial Fibrillation    History of Present Illness:  Wendy Munoz is a 76 y.o. female who presents for paroxysmal atrial fibrillation, acute inferior infarct April 2016 treated with proximal RCA DES, residual 60-75% segmental proximal LAD stenosis, hypertension, prediabetes, and chronic diastolic heart failure.  Had a sudden set of weakness and near syncope yesterday. EMS was summoned. By the time they arrived she was back to baseline. She began feeling weak and sweaty while standing at her sink. There was no chest discomfort. Several weeks prior she had lower extremity edema that was unexplained. She denies dyspnea, orthopnea, and PND. There were no palpitations. There was no chest pain.   Past Medical History:  Diagnosis Date  . Arthritis   . Atrial tachycardia ([place])    a. asymptomatic. Noted on tele during 11/2014 admission   . CAD (coronary artery disease)    a. 11/2014 inf STEMI s/p DES to RCA; 50-70% ruptured plaque in pLAD- rx medically  . GERD (gastroesophageal reflux disease)   . HTN (hypertension)   . Hyperglycemia   . Hyperlipidemia   . Hypothyroidism   . Ischemic cardiomyopathy    a. 11/2014  LV gram with inferior hypokinesis. EF 45-50%.  . Nocturia 01/08/2017  . Osteoporosis   . PAF (paroxysmal atrial fibrillation) ([place])    a. isolated episode of atrial fibrillation several years ago associated w/ her thyroid dysfunction (prior to ablation).   . Prediabetes     Past Surgical History:  Procedure Laterality Date  . BREAST SURGERY     breast biospy  . CATARACT EXTRACTION Bilateral   . COLONOSCOPY    . EYE SURGERY Bilateral 2005, 2006   Cataract with lens ImplaNT   . INGUINAL HERNIA REPAIR Right   . LEFT HEART CATHETERIZATION  WITH CORONARY ANGIOGRAM N/A 12/02/2014   Procedure: LEFT HEART CATHETERIZATION WITH CORONARY ANGIOGRAM;  Surgeon: Sinclair Grooms, MD;  Location: [facility] CATH LAB;  Service: Cardiovascular;  Laterality: N/A;  . ORIF HUMERUS FRACTURE Right 01/27/2014   Procedure: RIGHT OPEN REDUCTION INTERNAL FIXATION (ORIF) PROXIMAL HUMERUS FRACTURE;  Surgeon: Rozanna Box, MD;  Location: [place];  Service: Orthopedics;  Laterality: Right;  . TOTAL KNEE ARTHROPLASTY Right 2006  . TOTAL KNEE ARTHROPLASTY Left 2011  . UMBILICAL HERNIA REPAIR     as a baby    Current Medications: Outpatient Medications Prior to Visit  Medication Sig Dispense Refill  . acetaminophen (TYLENOL) 500 MG tablet Take 1-2 tablets (500-1,000 mg total) by mouth every 6 (six) hours as needed for moderate pain or fever. 90 tablet 0  . amoxicillin (AMOXIL) 500 MG tablet Take 4 tablets by mouth as needed. (Take one (1) hour prior to dental procedures)  3  . apixaban (ELIQUIS) 5 MG TABS tablet Take 1 tablet (5 mg total) by mouth 2 (two) times daily. 180 tablet 3  . B Complex Vitamins (B COMPLEX PO) Take 1 capsule by mouth daily.    . Calcium-Magnesium-Vitamin D (CALCIUM MAGNESIUM PO) Take 1 capsule by mouth daily.    . cetirizine (ZYRTEC) 10 MG tablet Take 10 mg by mouth at bedtime.    . Cholecalciferol (VITAMIN D) 2000 UNITS tablet Take 4,000 Units by mouth daily.    Marland Kitchen  ciprofloxacin (CIPRO) 250 MG tablet Take 1 tablet (250 mg total) by mouth 2 (two) times daily. 10 tablet 0  . Coenzyme Q10 200 MG TABS Take 200 mg by mouth daily.    Marland Kitchen levothyroxine (SYNTHROID, LEVOTHROID) 88 MCG tablet Take 1 tablet (88 mcg total) by mouth daily. 90 tablet 1  . LORazepam (ATIVAN) 0.5 MG tablet Take 0.5 mg by mouth at bedtime as needed for sleep.    . metoprolol tartrate (LOPRESSOR) 25 MG tablet TAKE 1 TABLET (25 MG TOTAL) BY MOUTH 2 (TWO) TIMES DAILY. 180 tablet 3  . nitroGLYCERIN (NITROSTAT) 0.4 MG SL tablet PLACE 1 TABLET (0.4 MG TOTAL) UNDER THE TONGUE EVERY 5  (FIVE) MINUTES AS NEEDED FOR CHEST PAIN. 25 tablet 0  . rosuvastatin (CRESTOR) 40 MG tablet Take 20 mg by mouth daily.     No facility-administered medications prior to visit.      Allergies:   Keflex [cephalexin]   Social History   Social History  . Marital status: Married    Spouse name: N/A  . Number of children: N/A  . Years of education: N/A   Occupational History  . retired     Social History Main Topics  . Smoking status: Never Smoker  . Smokeless tobacco: Never Used  . Alcohol use No  . Drug use: No  . Sexual activity: Not Asked   Other Topics Concern  . None   Social History Narrative  . None     Family History:  The patient's family history includes Arthritis in her maternal aunt; Breast cancer in her maternal aunt; CAD in her mother; Heart disease in her brother and mother; Heart failure in her mother; Hyperlipidemia in her sister; Hypertension in her brother and sister; Multiple myeloma in her father; Polycythemia in her father; Prostate cancer in her paternal grandfather; Stroke in her sister.   ROS:   Please see the history of present illness.    Bilateral ankle swelling, occasional blood in her urine when she has UTI, easy bruising, excessive fatigue and sweating particularly during the episode noted above on yesterday. Prior history of fainting during pregnancy and also in 2015 prior to right arm fracture.  All other systems reviewed and are negative.   PHYSICAL EXAM:   VS:  BP 132/70 (BP Location: Left Arm)   Pulse 66   Ht 5' 2"  (1.575 m)   Wt 203 lb 12.8 oz (92.4 kg)   BMI 37.28 kg/m    GEN: Well nourished, well developed, in no acute distress . Obese. HEENT: normal  Neck: no JVD, carotid bruits, or masses Cardiac: RRR; no murmurs, rubs, or gallops,no edema  Respiratory:  clear to auscultation bilaterally, normal work of breathing GI: soft, nontender, nondistended, + BS MS: no deformity or atrophy  Skin: warm and dry, no rash Neuro:  Alert and  Oriented x 3, Strength and sensation are intact Psych: euthymic mood, full affect  Wt Readings from Last 3 Encounters:  03/20/17 203 lb 12.8 oz (92.4 kg)  02/16/17 204 lb 3.2 oz (92.6 kg)  01/31/17 208 lb 6.4 oz (94.5 kg)      Studies/Labs Reviewed:   EKG:  EKG  Performed by EMS on 03/19/2017 at 1:55 PM demonstrates sinus bradycardia, nonspecific T wave abnormality, and otherwise unchanged.  Recent Labs: 01/08/2017: ALT 16; BUN 21; Creatinine, Ser 1.20; Potassium 4.1; Sodium 138; TSH 6.42 02/16/2017: Hemoglobin 11.4; Platelets 221   Lipid Panel    Component Value Date/Time   CHOL 182 01/08/2017  1536   TRIG 95.0 01/08/2017 1536   HDL 77.70 01/08/2017 1536   CHOLHDL 2 01/08/2017 1536   VLDL 19.0 01/08/2017 1536   LDLCALC 85 01/08/2017 1536    Additional studies/ records that were reviewed today include:     ASSESSMENT:    1. Near syncope   2. Coronary artery disease involving native coronary artery of native heart without angina pectoris   3. PAF (paroxysmal atrial fibrillation) ([place])   4. Ischemic cardiomyopathy   5. Essential hypertension      PLAN:  In order of problems listed above:  1. Uncertain etiology. Rule out tachybradycardia syndrome. Plan 48 hour monitor to rule out excessive bradycardia, tachycardia, or ventricular arrhythmia. 2. Troponin I to exclude ischemic cardiac event 3. Please see above under near syncope. 4. Not addressed 5. Blood pressure stable on the current medical regimen.  Keep follow-up appointment as previously scheduled. Troponin I, CBC, and electrolytes are obtained today. 48 hour monitor will help [place] exclude tendency towards arrhythmia that can produce near syncope.    Medication Adjustments/Labs and Tests Ordered: Current medicines are reviewed at length with the patient today.  Concerns regarding medicines are outlined above.  Medication changes, Labs and Tests ordered today are listed in the Patient Instructions below. Patient  Instructions  Medication Instructions:  None  Labwork: Troponin T, BMET, and CBC today  Testing/Procedures: Your physician has recommended that you wear a 48 hour holter monitor. Holter monitors are medical devices that record the heart's electrical activity. Doctors most often use these monitors to diagnose arrhythmias. Arrhythmias are problems with the speed or rhythm of the heartbeat. The monitor is a small, portable device. You can wear one while you do your normal daily activities. This is usually used to diagnose what is causing palpitations/syncope (passing out).    Follow-Up: Keep follow up as planned for April 2019.  Any Other Special Instructions Will Be Listed Below (If Applicable).     If you need a refill on your cardiac medications before your next appointment, please call your pharmacy.      Signed, Sinclair Grooms, MD  03/20/2017 2:08 PM    [facility] [address], [place], [place]  [address] Phone: [phone]; Fax: [phone]

## 2017-03-20 NOTE — Telephone Encounter (Signed)
New Message    Pt broke out in cold clammy sweat, she called the EMT's and they checked her out her bp had dropped and they suggested she be seen today

## 2017-03-20 NOTE — Patient Instructions (Signed)
Medication Instructions:  None  Labwork: Troponin T, BMET, and CBC today  Testing/Procedures: Your physician has recommended that you wear a 48 hour holter monitor. Holter monitors are medical devices that record the heart's electrical activity. Doctors most often use these monitors to diagnose arrhythmias. Arrhythmias are problems with the speed or rhythm of the heartbeat. The monitor is a small, portable device. You can wear one while you do your normal daily activities. This is usually used to diagnose what is causing palpitations/syncope (passing out).    Follow-Up: Keep follow up as planned for April 2019.  Any Other Special Instructions Will Be Listed Below (If Applicable).     If you need a refill on your cardiac medications before your next appointment, please call your pharmacy.

## 2017-04-03 ENCOUNTER — Ambulatory Visit (INDEPENDENT_AMBULATORY_CARE_PROVIDER_SITE_OTHER): Payer: PPO

## 2017-04-03 DIAGNOSIS — R55 Syncope and collapse: Secondary | ICD-10-CM | POA: Diagnosis not present

## 2017-04-11 ENCOUNTER — Other Ambulatory Visit (INDEPENDENT_AMBULATORY_CARE_PROVIDER_SITE_OTHER): Payer: PPO

## 2017-04-11 DIAGNOSIS — E039 Hypothyroidism, unspecified: Secondary | ICD-10-CM

## 2017-04-12 LAB — TSH: TSH: 0.27 u[IU]/mL — AB (ref 0.35–4.50)

## 2017-04-18 ENCOUNTER — Telehealth: Payer: Self-pay | Admitting: Family Medicine

## 2017-04-18 ENCOUNTER — Encounter: Payer: Self-pay | Admitting: Family Medicine

## 2017-04-18 DIAGNOSIS — E2839 Other primary ovarian failure: Secondary | ICD-10-CM

## 2017-04-18 DIAGNOSIS — Z1239 Encounter for other screening for malignant neoplasm of breast: Secondary | ICD-10-CM

## 2017-04-18 DIAGNOSIS — Z1231 Encounter for screening mammogram for malignant neoplasm of breast: Secondary | ICD-10-CM | POA: Diagnosis not present

## 2017-04-18 DIAGNOSIS — M8589 Other specified disorders of bone density and structure, multiple sites: Secondary | ICD-10-CM | POA: Diagnosis not present

## 2017-04-18 LAB — HM MAMMOGRAPHY

## 2017-04-18 NOTE — Telephone Encounter (Signed)
Bone density at [facility] was for today but they told pt they did not have MD authorization. They did go ahead and do the test (and mammogram) but they need MD authorization sent to them.  Caller name: Wendy Munoz Relationship to patient: self Can be reached: [phone]

## 2017-04-19 ENCOUNTER — Telehealth: Payer: Self-pay | Admitting: *Deleted

## 2017-04-19 ENCOUNTER — Telehealth (HOSPITAL_COMMUNITY): Payer: Self-pay | Admitting: *Deleted

## 2017-04-19 DIAGNOSIS — M25461 Effusion, right knee: Secondary | ICD-10-CM | POA: Diagnosis not present

## 2017-04-19 DIAGNOSIS — M2341 Loose body in knee, right knee: Secondary | ICD-10-CM | POA: Diagnosis not present

## 2017-04-19 DIAGNOSIS — Z471 Aftercare following joint replacement surgery: Secondary | ICD-10-CM | POA: Diagnosis not present

## 2017-04-19 DIAGNOSIS — Z96653 Presence of artificial knee joint, bilateral: Secondary | ICD-10-CM | POA: Diagnosis not present

## 2017-04-19 NOTE — Telephone Encounter (Signed)
Orders have been placed.    PC 

## 2017-04-19 NOTE — Telephone Encounter (Signed)
Received Physician Orders from [place], forwarded to provider/SLS 06/28

## 2017-04-19 NOTE — Telephone Encounter (Signed)
err

## 2017-04-19 NOTE — Telephone Encounter (Deleted)
err

## 2017-04-19 NOTE — Telephone Encounter (Signed)
Patient given detailed instructions per Myocardial Perfusion Study Information Sheet for the test on 04/24/17 at 10:00. Patient notified to arrive 15 minutes early and that it is imperative to arrive on time for appointment to keep from having the test rescheduled.  If you need to cancel or reschedule your appointment, please call the office within 24 hours of your appointment. . Patient verbalized understanding.Wendy Munoz

## 2017-04-24 ENCOUNTER — Ambulatory Visit (HOSPITAL_COMMUNITY): Payer: PPO | Attending: Interventional Cardiology

## 2017-04-24 DIAGNOSIS — I4891 Unspecified atrial fibrillation: Secondary | ICD-10-CM | POA: Insufficient documentation

## 2017-04-24 DIAGNOSIS — Z8249 Family history of ischemic heart disease and other diseases of the circulatory system: Secondary | ICD-10-CM | POA: Diagnosis not present

## 2017-04-24 DIAGNOSIS — R55 Syncope and collapse: Secondary | ICD-10-CM | POA: Diagnosis not present

## 2017-04-24 DIAGNOSIS — I2510 Atherosclerotic heart disease of native coronary artery without angina pectoris: Secondary | ICD-10-CM | POA: Insufficient documentation

## 2017-04-24 DIAGNOSIS — I779 Disorder of arteries and arterioles, unspecified: Secondary | ICD-10-CM | POA: Diagnosis not present

## 2017-04-24 DIAGNOSIS — I10 Essential (primary) hypertension: Secondary | ICD-10-CM | POA: Insufficient documentation

## 2017-04-24 DIAGNOSIS — R7303 Prediabetes: Secondary | ICD-10-CM | POA: Diagnosis not present

## 2017-04-24 LAB — MYOCARDIAL PERFUSION IMAGING
CHL CUP NUCLEAR SDS: 3
CHL CUP NUCLEAR SRS: 2
CHL CUP NUCLEAR SSS: 5
LHR: 0.35
LV dias vol: 96 mL (ref 46–106)
LV sys vol: 48 mL
NUC STRESS TID: 1.05
Peak HR: 81 {beats}/min
Rest HR: 48 {beats}/min

## 2017-04-24 MED ORDER — TECHNETIUM TC 99M TETROFOSMIN IV KIT
30.6000 | PACK | Freq: Once | INTRAVENOUS | Status: AC | PRN
  Administered 2017-04-24: 30.6 via INTRAVENOUS
  Filled 2017-04-24: qty 31

## 2017-04-24 MED ORDER — REGADENOSON 0.4 MG/5ML IV SOLN
0.4000 mg | Freq: Once | INTRAVENOUS | Status: AC
  Administered 2017-04-24: 0.4 mg via INTRAVENOUS

## 2017-04-24 MED ORDER — TECHNETIUM TC 99M TETROFOSMIN IV KIT
10.3000 | PACK | Freq: Once | INTRAVENOUS | Status: AC | PRN
  Administered 2017-04-24: 10.3 via INTRAVENOUS
  Filled 2017-04-24: qty 11

## 2017-05-01 ENCOUNTER — Encounter: Payer: Self-pay | Admitting: Family Medicine

## 2017-05-14 ENCOUNTER — Telehealth: Payer: Self-pay | Admitting: Medical

## 2017-05-14 ENCOUNTER — Telehealth: Payer: Self-pay | Admitting: Family Medicine

## 2017-05-14 NOTE — Telephone Encounter (Signed)
Please advise   Pc 

## 2017-05-14 NOTE — Telephone Encounter (Signed)
Pt called in to be advised. She had a thyroid lab completed. She was advised to take current dose of levothyroxine listed. Pt says that on Saturday she experienced afib. Her heart rate got up to 110. She would like to be advised on if she should continue with that dosage or take a previous dosage considering her afib episode. She would like a call back to discuss and be advised  further.      CB: [id number]

## 2017-05-14 NOTE — Telephone Encounter (Signed)
Pt needs 30 minute appointment. He had low tsh last month. So being over supplemented can be factor in atrial fibrillation. Where was she seen for atrial fibrillation recenty?   I think she needs office visit listen to her heart.make sure in good rhythm. If not then get ekg. Also recommend getting tsh, t3 and t4 level.  She needs to somebody and recommend as stated 30 minute.  Dr. Charlett Blake did recommend some change to thryoid med one onth ago. But I would like to check labs and see where she is at now.

## 2017-05-16 ENCOUNTER — Telehealth: Payer: Self-pay

## 2017-05-16 NOTE — Telephone Encounter (Signed)
Patient scheduled with PCP for  Monday 05/21/2017 30 minute slot

## 2017-05-16 NOTE — Telephone Encounter (Signed)
Left message for patient to call and schedule apponitment  Per E. Saguier.

## 2017-05-16 NOTE — Telephone Encounter (Signed)
Please call and schedule patient

## 2017-05-16 NOTE — Telephone Encounter (Signed)
Message left for patient to call back and schedule an appointment.

## 2017-05-21 ENCOUNTER — Other Ambulatory Visit: Payer: PPO

## 2017-05-21 ENCOUNTER — Telehealth: Payer: Self-pay | Admitting: Family Medicine

## 2017-05-21 ENCOUNTER — Ambulatory Visit (INDEPENDENT_AMBULATORY_CARE_PROVIDER_SITE_OTHER): Payer: PPO | Admitting: Family Medicine

## 2017-05-21 ENCOUNTER — Encounter: Payer: Self-pay | Admitting: Family Medicine

## 2017-05-21 VITALS — BP 102/66 | HR 52 | Temp 98.2°F | Resp 18 | Wt 207.4 lb

## 2017-05-21 DIAGNOSIS — E039 Hypothyroidism, unspecified: Secondary | ICD-10-CM | POA: Diagnosis not present

## 2017-05-21 DIAGNOSIS — R350 Frequency of micturition: Secondary | ICD-10-CM

## 2017-05-21 DIAGNOSIS — D649 Anemia, unspecified: Secondary | ICD-10-CM | POA: Diagnosis not present

## 2017-05-21 DIAGNOSIS — I10 Essential (primary) hypertension: Secondary | ICD-10-CM | POA: Diagnosis not present

## 2017-05-21 DIAGNOSIS — M810 Age-related osteoporosis without current pathological fracture: Secondary | ICD-10-CM

## 2017-05-21 DIAGNOSIS — I519 Heart disease, unspecified: Secondary | ICD-10-CM | POA: Diagnosis not present

## 2017-05-21 DIAGNOSIS — I4719 Other supraventricular tachycardia: Secondary | ICD-10-CM

## 2017-05-21 DIAGNOSIS — I471 Supraventricular tachycardia: Secondary | ICD-10-CM | POA: Diagnosis not present

## 2017-05-21 DIAGNOSIS — E785 Hyperlipidemia, unspecified: Secondary | ICD-10-CM | POA: Diagnosis not present

## 2017-05-21 DIAGNOSIS — G4700 Insomnia, unspecified: Secondary | ICD-10-CM

## 2017-05-21 DIAGNOSIS — D539 Nutritional anemia, unspecified: Secondary | ICD-10-CM | POA: Insufficient documentation

## 2017-05-21 HISTORY — DX: Nutritional anemia, unspecified: D53.9

## 2017-05-21 HISTORY — DX: Anemia, unspecified: D64.9

## 2017-05-21 LAB — CBC
HEMATOCRIT: 35.6 % — AB (ref 36.0–46.0)
HEMOGLOBIN: 11.8 g/dL — AB (ref 12.0–15.0)
MCHC: 33.1 g/dL (ref 30.0–36.0)
MCV: 95.3 fl (ref 78.0–100.0)
PLATELETS: 188 10*3/uL (ref 150.0–400.0)
RBC: 3.73 Mil/uL — AB (ref 3.87–5.11)
RDW: 13.5 % (ref 11.5–15.5)
WBC: 4.9 10*3/uL (ref 4.0–10.5)

## 2017-05-21 LAB — COMPREHENSIVE METABOLIC PANEL
ALBUMIN: 4 g/dL (ref 3.5–5.2)
ALK PHOS: 55 U/L (ref 39–117)
ALT: 17 U/L (ref 0–35)
AST: 25 U/L (ref 0–37)
BILIRUBIN TOTAL: 0.6 mg/dL (ref 0.2–1.2)
BUN: 16 mg/dL (ref 6–23)
CALCIUM: 9 mg/dL (ref 8.4–10.5)
CO2: 26 mEq/L (ref 19–32)
Chloride: 104 mEq/L (ref 96–112)
Creatinine, Ser: 0.95 mg/dL (ref 0.40–1.20)
GFR: 60.85 mL/min (ref >60.00)
Glucose, Bld: 104 mg/dL — ABNORMAL HIGH (ref 70–99)
POTASSIUM: 4.1 meq/L (ref 3.5–5.1)
Sodium: 137 mEq/L (ref 135–145)
TOTAL PROTEIN: 7.1 g/dL (ref 6.0–8.3)

## 2017-05-21 LAB — URINALYSIS, ROUTINE W REFLEX MICROSCOPIC
BILIRUBIN URINE: NEGATIVE
Ketones, ur: NEGATIVE
Nitrite: NEGATIVE
PH: 6.5 (ref 5.0–8.0)
Specific Gravity, Urine: <=1.005 — AB (ref 1.000–1.030)
Total Protein, Urine: NEGATIVE
UROBILINOGEN UA: 0.2 (ref 0.0–1.0)
Urine Glucose: NEGATIVE

## 2017-05-21 LAB — MAGNESIUM: Magnesium: 2.2 mg/dL (ref 1.5–2.5)

## 2017-05-21 LAB — TSH: TSH: 2.03 u[IU]/mL (ref 0.35–4.50)

## 2017-05-21 NOTE — Assessment & Plan Note (Signed)
Well controlled, no changes to meds. Encouraged heart healthy diet such as the DASH diet and exercise as tolerated.  °

## 2017-05-21 NOTE — Assessment & Plan Note (Signed)
On Levothyroxine, continue to monitor, check tsh today due to dropping Levothyroxine to 6 days a week

## 2017-05-21 NOTE — Telephone Encounter (Signed)
Caller name:Susan w/ [facility] Relationship to patient: Can be [phone] Pharmacy:  Reason for call:need order for urine culture, states UA is positive.

## 2017-05-21 NOTE — Assessment & Plan Note (Signed)
On 7/21 she had a 2-3 hours of taat that time chycardia of 90-110. No other associated symptoms

## 2017-05-21 NOTE — Assessment & Plan Note (Signed)
Encouraged to get adequate exercise, calcium and vitamin d intake 

## 2017-05-21 NOTE — Progress Notes (Signed)
Subjective:  I acted as a Education administrator for Dr. Charlett Blake. [place], [place]  Patient ID: Wendy Munoz, female    DOB: 08/02/1941, 76 y.o.   MRN: [id number]  No chief complaint on file.   HPI  Patient is in today for a follow up. She is accompanied by her husband. They note she is continuing to sleep poorly. Trouble with urinary frequency, no dysuria or hematuria. Struggles with fatigue and had an episode of palpitations but only once lasting over an hour. No associated symptoms. Denies CP/palp/SOB/HA/congestion/fevers/GI c/o. Taking meds as prescribed  Patient Care Team: Mosie Lukes, MD as PCP - General (Family Medicine)   Past Medical History:  Diagnosis Date  . Anemia 05/21/2017  . Arthritis   . Atrial tachycardia ([place])    a. asymptomatic. Noted on tele during 11/2014 admission   . CAD (coronary artery disease)    a. 11/2014 inf STEMI s/p DES to RCA; 50-70% ruptured plaque in pLAD- rx medically  . GERD (gastroesophageal reflux disease)   . HTN (hypertension)   . Hyperglycemia   . Hyperlipidemia   . Hypothyroidism   . Insomnia 05/27/2017  . Ischemic cardiomyopathy    a. 11/2014  LV gram with inferior hypokinesis. EF 45-50%.  . Nocturia 01/08/2017  . Osteoporosis   . PAF (paroxysmal atrial fibrillation) ([place])    a. isolated episode of atrial fibrillation several years ago associated w/ her thyroid dysfunction (prior to ablation).   . Prediabetes     Past Surgical History:  Procedure Laterality Date  . BREAST SURGERY     breast biospy  . CATARACT EXTRACTION Bilateral   . COLONOSCOPY    . EYE SURGERY Bilateral 2005, 2006   Cataract with lens ImplaNT   . INGUINAL HERNIA REPAIR Right   . LEFT HEART CATHETERIZATION WITH CORONARY ANGIOGRAM N/A 12/02/2014   Procedure: LEFT HEART CATHETERIZATION WITH CORONARY ANGIOGRAM;  Surgeon: Sinclair Grooms, MD;  Location[facility] LAB;  Service: Cardiovascular;  Laterality: N/A;  . ORIF HUMERUS FRACTURE Right 01/27/2014   Procedure: RIGHT OPEN  REDUCTION INTERNAL FIXATION (ORIF) PROXIMAL HUMERUS FRACTURE;  Surgeon: Rozanna Box, MD;  Location: [facility];  Service: Orthopedics;  Laterality: Right;  . TOTAL KNEE ARTHROPLASTY Right 2006  . TOTAL KNEE ARTHROPLASTY Left 2011  . UMBILICAL HERNIA REPAIR     as a baby    Family History  Problem Relation Age of Onset  . CAD Mother   . Heart disease Mother   . Heart failure Mother   . Polycythemia Father   . Multiple myeloma Father   . Prostate cancer Paternal Grandfather   . Breast cancer Unknown   . Arthritis Maternal Aunt   . Breast cancer Maternal Aunt   . Stroke Sister   . Hyperlipidemia Sister   . Hypertension Sister   . Heart disease Brother        bradycardia  . Hypertension Brother     Social History   Social History  . Marital status: Married    Spouse name: N/A  . Number of children: N/A  . Years of education: N/A   Occupational History  . retired     Social History Main Topics  . Smoking status: Never Smoker  . Smokeless tobacco: Never Used  . Alcohol use No  . Drug use: No  . Sexual activity: Not on file   Other Topics Concern  . Not on file   Social History Narrative  . No narrative on file  Outpatient Medications Prior to Visit  Medication Sig Dispense Refill  . acetaminophen (TYLENOL) 500 MG tablet Take 1-2 tablets (500-1,000 mg total) by mouth every 6 (six) hours as needed for moderate pain or fever. 90 tablet 0  . amoxicillin (AMOXIL) 500 MG tablet Take 4 tablets by mouth as needed. (Take one (1) hour prior to dental procedures)  3  . apixaban (ELIQUIS) 5 MG TABS tablet Take 1 tablet (5 mg total) by mouth 2 (two) times daily. 180 tablet 3  . B Complex Vitamins (B COMPLEX PO) Take 1 capsule by mouth daily.    . Calcium-Magnesium-Vitamin D (CALCIUM MAGNESIUM PO) Take 1 capsule by mouth daily.    . cetirizine (ZYRTEC) 10 MG tablet Take 10 mg by mouth at bedtime.    . Cholecalciferol (VITAMIN D) 2000 UNITS tablet Take 4,000 Units by mouth  daily.    . ciprofloxacin (CIPRO) 250 MG tablet Take 1 tablet (250 mg total) by mouth 2 (two) times daily. 10 tablet 0  . Coenzyme Q10 200 MG TABS Take 200 mg by mouth daily.    Marland Kitchen levothyroxine (SYNTHROID, LEVOTHROID) 88 MCG tablet Take 1 tablet (88 mcg total) by mouth daily. 90 tablet 1  . LORazepam (ATIVAN) 0.5 MG tablet Take 0.5 mg by mouth at bedtime as needed for sleep.    . metoprolol tartrate (LOPRESSOR) 25 MG tablet TAKE 1 TABLET (25 MG TOTAL) BY MOUTH 2 (TWO) TIMES DAILY. 180 tablet 3  . nitroGLYCERIN (NITROSTAT) 0.4 MG SL tablet PLACE 1 TABLET (0.4 MG TOTAL) UNDER THE TONGUE EVERY 5 (FIVE) MINUTES AS NEEDED FOR CHEST PAIN. 25 tablet 0  . rosuvastatin (CRESTOR) 40 MG tablet Take 20 mg by mouth daily.     No facility-administered medications prior to visit.     Allergies  Allergen Reactions  . Keflex [Cephalexin] Rash    Review of Systems  Constitutional: Positive for malaise/fatigue. Negative for fever.  HENT: Negative for congestion.   Eyes: Negative for blurred vision.  Respiratory: Negative for cough and shortness of breath.   Cardiovascular: Negative for chest pain, palpitations and leg swelling.  Gastrointestinal: Negative for vomiting.  Genitourinary: Positive for frequency. Negative for dysuria and hematuria.  Musculoskeletal: Negative for back pain.  Skin: Negative for rash.  Neurological: Negative for loss of consciousness and headaches.  Psychiatric/Behavioral: The patient has insomnia.        Objective:    Physical Exam  Constitutional: She is oriented to person, place, and time. She appears well-developed and well-nourished. No distress.  HENT:  Head: Normocephalic and atraumatic.  Eyes: Conjunctivae are normal.  Neck: Normal range of motion. No thyromegaly present.  Cardiovascular: Normal rate.   Irregularly, irregular  Pulmonary/Chest: Effort normal and breath sounds normal. She has no wheezes.  Abdominal: Soft. Bowel sounds are normal. There is no  tenderness.  Musculoskeletal: Normal range of motion. She exhibits no edema or deformity.  Neurological: She is alert and oriented to person, place, and time.  Skin: Skin is warm and dry. She is not diaphoretic.  Psychiatric: She has a normal mood and affect.    BP 102/66 (BP Location: Left Arm, Patient Position: Sitting, Cuff Size: Normal)   Pulse (!) 52   Temp 98.2 F (36.8 C) (Oral)   Resp 18   Wt 207 lb 6.4 oz (94.1 kg)   SpO2 98%   BMI 37.93 kg/m  Wt Readings from Last 3 Encounters:  05/21/17 207 lb 6.4 oz (94.1 kg)  03/20/17 203 lb 12.8 oz (  92.4 kg)  02/16/17 204 lb 3.2 oz (92.6 kg)   BP Readings from Last 3 Encounters:  05/21/17 102/66  03/20/17 132/70  02/16/17 110/60      There is no immunization history on file for this patient.  Health Maintenance  Topic Date Due  . TETANUS/TDAP  10/09/1960  . DEXA SCAN  10/09/2006  . PNA vac Low Risk Adult (1 of 2 - PCV13) 10/09/2006  . INFLUENZA VACCINE  05/23/2017  . COLONOSCOPY  11/21/2026    Lab Results  Component Value Date   WBC 4.9 05/21/2017   HGB 11.8 (L) 05/21/2017   HCT 35.6 (L) 05/21/2017   PLT 188.0 05/21/2017   GLUCOSE 104 (H) 05/21/2017   CHOL 182 01/08/2017   TRIG 95.0 01/08/2017   HDL 77.70 01/08/2017   LDLCALC 85 01/08/2017   ALT 17 05/21/2017   AST 25 05/21/2017   NA 137 05/21/2017   K 4.1 05/21/2017   CL 104 05/21/2017   CREATININE 0.95 05/21/2017   BUN 16 05/21/2017   CO2 26 05/21/2017   TSH 2.03 05/21/2017   INR 1.05 01/27/2014   HGBA1C 6.3 01/08/2017    Lab Results  Component Value Date   TSH 2.03 05/21/2017   Lab Results  Component Value Date   WBC 4.9 05/21/2017   HGB 11.8 (L) 05/21/2017   HCT 35.6 (L) 05/21/2017   MCV 95.3 05/21/2017   PLT 188.0 05/21/2017   Lab Results  Component Value Date   NA 137 05/21/2017   K 4.1 05/21/2017   CO2 26 05/21/2017   GLUCOSE 104 (H) 05/21/2017   BUN 16 05/21/2017   CREATININE 0.95 05/21/2017   BILITOT 0.6 05/21/2017   ALKPHOS 55  05/21/2017   AST 25 05/21/2017   ALT 17 05/21/2017   PROT 7.1 05/21/2017   ALBUMIN 4.0 05/21/2017   CALCIUM 9.0 05/21/2017   ANIONGAP 7 04/15/2015   GFR 60.85 05/21/2017   Lab Results  Component Value Date   CHOL 182 01/08/2017   Lab Results  Component Value Date   HDL 77.70 01/08/2017   Lab Results  Component Value Date   LDLCALC 85 01/08/2017   Lab Results  Component Value Date   TRIG 95.0 01/08/2017   Lab Results  Component Value Date   CHOLHDL 2 01/08/2017   Lab Results  Component Value Date   HGBA1C 6.3 01/08/2017         Assessment & Plan:   Problem List Items Addressed This Visit    Hypothyroidism    On Levothyroxine, continue to monitor, check tsh today due to dropping Levothyroxine to 6 days a week      HTN (hypertension)    Well controlled, no changes to meds. Encouraged heart healthy diet such as the DASH diet and exercise as tolerated.       Relevant Orders   CBC (Completed)   Comprehensive metabolic panel (Completed)   TSH (Completed)   Magnesium (Completed)   Osteoporosis    Encouraged to get adequate exercise, calcium and vitamin d intake      Atrial tachycardia ([place])    On 7/21 she had a 2-3 hours of taat that time chycardia of 90-110. No other associated symptoms       Relevant Orders   Magnesium (Completed)   Anemia   Insomnia    Encouraged good sleep hygiene such as dark, quiet room. No blue/green glowing lights such as computer screens in bedroom. No alcohol or stimulants in evening. Cut down on  caffeine as able. Regular exercise is helpful but not just prior to bed time.       Urinary frequency - Primary    Culture reveals Ecoli UTI started on Augmentin      Relevant Orders   Urinalysis   Urine Culture   Hyperlipidemia      I am having Ms. Proctor maintain her cetirizine, Vitamin D, Calcium-Magnesium-Vitamin D (CALCIUM MAGNESIUM PO), acetaminophen, Coenzyme Q10, LORazepam, rosuvastatin, nitroGLYCERIN, metoprolol  tartrate, amoxicillin, B Complex Vitamins (B COMPLEX PO), apixaban, levothyroxine, and ciprofloxacin.  No orders of the defined types were placed in this encounter.   CMA served as Education administrator during this visit. History, Physical and Plan performed by medical provider. Documentation and orders reviewed and attested to.  Penni Homans, MD

## 2017-05-21 NOTE — Telephone Encounter (Signed)
Orders have been placed.  Wendy Munoz has been contacted  [facility]

## 2017-05-21 NOTE — Patient Instructions (Signed)

## 2017-05-24 ENCOUNTER — Other Ambulatory Visit: Payer: Self-pay | Admitting: Family Medicine

## 2017-05-24 LAB — URINE CULTURE

## 2017-05-24 MED ORDER — AMOXICILLIN-POT CLAVULANATE ER 1000-62.5 MG PO TB12: 2.0000 | ORAL_TABLET | Freq: Two times a day (BID) | ORAL | 0 refills | Status: DC

## 2017-05-27 ENCOUNTER — Encounter: Payer: Self-pay | Admitting: Family Medicine

## 2017-05-27 DIAGNOSIS — E785 Hyperlipidemia, unspecified: Secondary | ICD-10-CM | POA: Insufficient documentation

## 2017-05-27 DIAGNOSIS — G4700 Insomnia, unspecified: Secondary | ICD-10-CM | POA: Insufficient documentation

## 2017-05-27 DIAGNOSIS — R350 Frequency of micturition: Secondary | ICD-10-CM

## 2017-05-27 HISTORY — DX: Insomnia, unspecified: G47.00

## 2017-05-27 HISTORY — DX: Frequency of micturition: R35.0

## 2017-05-27 NOTE — Assessment & Plan Note (Signed)
Culture reveals Ecoli UTI started on Augmentin

## 2017-05-27 NOTE — Assessment & Plan Note (Signed)
Encouraged good sleep hygiene such as dark, quiet room. No blue/green glowing lights such as computer screens in bedroom. No alcohol or stimulants in evening. Cut down on caffeine as able. Regular exercise is helpful but not just prior to bed time.  

## 2017-05-28 ENCOUNTER — Ambulatory Visit: Payer: PPO | Admitting: Family Medicine

## 2017-06-26 ENCOUNTER — Encounter: Payer: Self-pay | Admitting: Family Medicine

## 2017-06-26 ENCOUNTER — Ambulatory Visit (INDEPENDENT_AMBULATORY_CARE_PROVIDER_SITE_OTHER): Payer: PPO | Admitting: Family Medicine

## 2017-06-26 VITALS — BP 132/76 | HR 57 | Temp 97.7°F | Ht 62.5 in | Wt 207.6 lb

## 2017-06-26 DIAGNOSIS — I471 Supraventricular tachycardia: Secondary | ICD-10-CM

## 2017-06-26 DIAGNOSIS — T7840XA Allergy, unspecified, initial encounter: Secondary | ICD-10-CM | POA: Insufficient documentation

## 2017-06-26 DIAGNOSIS — R319 Hematuria, unspecified: Secondary | ICD-10-CM | POA: Diagnosis not present

## 2017-06-26 DIAGNOSIS — I10 Essential (primary) hypertension: Secondary | ICD-10-CM | POA: Diagnosis not present

## 2017-06-26 DIAGNOSIS — Z0000 Encounter for general adult medical examination without abnormal findings: Secondary | ICD-10-CM | POA: Diagnosis not present

## 2017-06-26 DIAGNOSIS — M810 Age-related osteoporosis without current pathological fracture: Secondary | ICD-10-CM | POA: Diagnosis not present

## 2017-06-26 DIAGNOSIS — T7840XD Allergy, unspecified, subsequent encounter: Secondary | ICD-10-CM | POA: Diagnosis not present

## 2017-06-26 DIAGNOSIS — Z23 Encounter for immunization: Secondary | ICD-10-CM

## 2017-06-26 DIAGNOSIS — E785 Hyperlipidemia, unspecified: Secondary | ICD-10-CM | POA: Diagnosis not present

## 2017-06-26 DIAGNOSIS — J300 Vasomotor rhinitis: Secondary | ICD-10-CM

## 2017-06-26 DIAGNOSIS — E669 Obesity, unspecified: Secondary | ICD-10-CM | POA: Diagnosis not present

## 2017-06-26 DIAGNOSIS — R739 Hyperglycemia, unspecified: Secondary | ICD-10-CM

## 2017-06-26 DIAGNOSIS — E039 Hypothyroidism, unspecified: Secondary | ICD-10-CM | POA: Diagnosis not present

## 2017-06-26 DIAGNOSIS — D649 Anemia, unspecified: Secondary | ICD-10-CM

## 2017-06-26 HISTORY — DX: Vasomotor rhinitis: J30.0

## 2017-06-26 HISTORY — DX: Allergy, unspecified, initial encounter: T78.40XA

## 2017-06-26 HISTORY — DX: Obesity, unspecified: E66.9

## 2017-06-26 HISTORY — DX: Encounter for general adult medical examination without abnormal findings: Z00.00

## 2017-06-26 LAB — URINALYSIS, ROUTINE W REFLEX MICROSCOPIC
BILIRUBIN URINE: NEGATIVE
HGB URINE DIPSTICK: NEGATIVE
Ketones, ur: NEGATIVE
Nitrite: NEGATIVE
RBC / HPF: NONE SEEN (ref 0–?)
Specific Gravity, Urine: <=1.005 — AB (ref 1.000–1.030)
Total Protein, Urine: NEGATIVE
Urine Glucose: NEGATIVE
Urobilinogen, UA: 0.2 (ref 0.0–1.0)
pH: 6 (ref 5.0–8.0)

## 2017-06-26 LAB — COMPREHENSIVE METABOLIC PANEL
ALBUMIN: 4.3 g/dL (ref 3.5–5.2)
ALK PHOS: 58 U/L (ref 39–117)
ALT: 17 U/L (ref 0–35)
AST: 26 U/L (ref 0–37)
BILIRUBIN TOTAL: 0.8 mg/dL (ref 0.2–1.2)
BUN: 11 mg/dL (ref 6–23)
CO2: 26 mEq/L (ref 19–32)
Calcium: 9.7 mg/dL (ref 8.4–10.5)
Chloride: 102 mEq/L (ref 96–112)
Creatinine, Ser: 0.99 mg/dL (ref 0.40–1.20)
GFR: 58.01 mL/min — AB (ref >60.00)
GLUCOSE: 106 mg/dL — AB (ref 70–99)
Potassium: 4.2 mEq/L (ref 3.5–5.1)
SODIUM: 138 meq/L (ref 135–145)
TOTAL PROTEIN: 7.2 g/dL (ref 6.0–8.3)

## 2017-06-26 LAB — CBC
HEMATOCRIT: 36.3 % (ref 36.0–46.0)
Hemoglobin: 11.9 g/dL — ABNORMAL LOW (ref 12.0–15.0)
MCHC: 32.9 g/dL (ref 30.0–36.0)
MCV: 94.7 fl (ref 78.0–100.0)
Platelets: 192 10*3/uL (ref 150.0–400.0)
RBC: 3.84 Mil/uL — AB (ref 3.87–5.11)
RDW: 14 % (ref 11.5–15.5)
WBC: 4.8 10*3/uL (ref 4.0–10.5)

## 2017-06-26 LAB — LIPID PANEL
CHOL/HDL RATIO: 2
CHOLESTEROL: 176 mg/dL (ref 0–200)
HDL: 77.1 mg/dL (ref >39.00)
LDL CALC: 74 mg/dL (ref 0–99)
NONHDL: 98.65
Triglycerides: 125 mg/dL (ref 0.0–149.0)
VLDL: 25 mg/dL (ref 0.0–40.0)

## 2017-06-26 LAB — TSH: TSH: 2.57 u[IU]/mL (ref 0.35–4.50)

## 2017-06-26 LAB — HEMOGLOBIN A1C: Hgb A1c MFr Bld: 6.3 % (ref 4.6–6.5)

## 2017-06-26 NOTE — Assessment & Plan Note (Signed)
Increase leafy greens, consider increased lean red meat and using cast iron cookware. Continue to monitor, report any concerns 

## 2017-06-26 NOTE — Assessment & Plan Note (Signed)
Encouraged to get adequate exercise, calcium and vitamin d intake. Rel of rec for last bone density test done at [facility]

## 2017-06-26 NOTE — Assessment & Plan Note (Signed)
Declines Astelin she will let u know fi she changes her mind and wants to proceed

## 2017-06-26 NOTE — Assessment & Plan Note (Signed)
1 episode of afib last 2-3 hours back in July. Only one episode of afib this year. Is being maintained on Metoprolol with good results.

## 2017-06-26 NOTE — Assessment & Plan Note (Signed)
On Levothyroxine, continue to monitor 

## 2017-06-26 NOTE — Assessment & Plan Note (Signed)
Tolerating statin, encouraged heart healthy diet, avoid trans fats, minimize simple carbs and saturated fats. Increase exercise as tolerated 

## 2017-06-26 NOTE — Assessment & Plan Note (Addendum)
Check UA, had a recent UTI with [facility], asymptomatic

## 2017-06-26 NOTE — Assessment & Plan Note (Addendum)
Patient encouraged to maintain heart healthy diet, regular exercise, adequate sleep. Consider daily probiotics. Take medications as prescribed. Check labs today. MGM utd checked breast exam today.

## 2017-06-26 NOTE — Assessment & Plan Note (Signed)
Encouraged DASH diet, decrease po intake and increase exercise as tolerated. Needs 7-8 hours of sleep nightly. Avoid trans fats, eat small, frequent meals every 4-5 hours with lean proteins, complex carbs and healthy fats. Minimize simple carbs, declines bariatric referral

## 2017-06-26 NOTE — Assessment & Plan Note (Signed)
Cetirizine no longer helping enough so can try twice daily on that if not helpful try Allegra 180 mg daily

## 2017-06-26 NOTE — Patient Instructions (Signed)
Preventive Care 65 Years and Older, Female Preventive care refers to lifestyle choices and visits with your health care provider that can promote health and wellness. What does preventive care include?  A yearly physical exam. This is also called an annual well check.  Dental exams once or twice a year.  Routine eye exams. Ask your health care provider how often you should have your eyes checked.  Personal lifestyle choices, including: ? Daily care of your teeth and gums. ? Regular physical activity. ? Eating a healthy diet. ? Avoiding tobacco and drug use. ? Limiting alcohol use. ? Practicing safe sex. ? Taking low-dose aspirin every day. ? Taking vitamin and mineral supplements as recommended by your health care provider. What happens during an annual well check? The services and screenings done by your health care provider during your annual well check will depend on your age, overall health, lifestyle risk factors, and family history of disease. Counseling Your health care provider may ask you questions about your:  Alcohol use.  Tobacco use.  Drug use.  Emotional well-being.  Home and relationship well-being.  Sexual activity.  Eating habits.  History of falls.  Memory and ability to understand (cognition).  Work and work environment.  Reproductive health.  Screening You may have the following tests or measurements:  Height, weight, and BMI.  Blood pressure.  Lipid and cholesterol levels. These may be checked every 5 years, or more frequently if you are over 50 years old.  Skin check.  Lung cancer screening. You may have this screening every year starting at age 55 if you have a 30-pack-year history of smoking and currently smoke or have quit within the past 15 years.  Fecal occult blood test (FOBT) of the stool. You may have this test every year starting at age 50.  Flexible sigmoidoscopy or colonoscopy. You may have a sigmoidoscopy every 5 years or  a colonoscopy every 10 years starting at age 50.  Hepatitis C blood test.  Hepatitis B blood test.  Sexually transmitted disease (STD) testing.  Diabetes screening. This is done by checking your blood sugar (glucose) after you have not eaten for a while (fasting). You may have this done every 1-3 years.  Bone density scan. This is done to screen for osteoporosis. You may have this done starting at age 76.  Mammogram. This may be done every 1-2 years. Talk to your health care provider about how often you should have regular mammograms.  Talk with your health care provider about your test results, treatment options, and if necessary, the need for more tests. Vaccines Your health care provider may recommend certain vaccines, such as:  Influenza vaccine. This is recommended every year.  Tetanus, diphtheria, and acellular pertussis (Tdap, Td) vaccine. You may need a Td booster every 10 years.  Varicella vaccine. You may need this if you have not been vaccinated.  Zoster vaccine. You may need this after age 60.  Measles, mumps, and rubella (MMR) vaccine. You may need at least one dose of MMR if you were born in 1957 or later. You may also need a second dose.  Pneumococcal 13-valent conjugate (PCV13) vaccine. One dose is recommended after age 76.  Pneumococcal polysaccharide (PPSV23) vaccine. One dose is recommended after age 76.  Meningococcal vaccine. You may need this if you have certain conditions.  Hepatitis A vaccine. You may need this if you have certain conditions or if you travel or work in places where you may be exposed to hepatitis   A.  Hepatitis B vaccine. You may need this if you have certain conditions or if you travel or work in places where you may be exposed to hepatitis B.  Haemophilus influenzae type b (Hib) vaccine. You may need this if you have certain conditions.  Talk to your health care provider about which screenings and vaccines you need and how often you  need them. This information is not intended to replace advice given to you by your health care provider. Make sure you discuss any questions you have with your health care provider. Document Released: 11/05/2015 Document Revised: 06/28/2016 Document Reviewed: 08/10/2015 Elsevier Interactive Patient Education  2017 [employer].

## 2017-06-26 NOTE — Assessment & Plan Note (Signed)
Well controlled, no changes to meds. Encouraged heart healthy diet such as the DASH diet and exercise as tolerated.  °

## 2017-06-26 NOTE — Progress Notes (Signed)
Patient ID: MIKERIA VALIN, female   DOB: 06-06-1941, 76 y.o.   MRN: [id number]   Subjective:    Patient ID: Jearld Lesch, female    DOB: October 13, 1941, 76 y.o.   MRN: [id number]  Chief Complaint  Patient presents with  . Annual Exam    Patient is here today for a CPE.     HPI Patient is in today for annual preventative exam and follow up on multiple Medical concerns including hypoglycemia, hypothyroidism, hyperlipidemia, hypertension and tachycardia. She feels well today. she feels her allergies are worse and her cetirizine is not helping as much. She does endorse clear rhinorrhea without associated symptoms associated with eating especially hot foods. No recent fevers or chills. No recent hospitalization or acute illness. She reports having a recent mammogram which was normal and a recent bone scan which is not available to [place] at this visit. She is doing well with activities of daily living. She has done a better job of maintaining a heart healthy diet and minimizing carbohydrate but she has not increased her exercise activity to date. Denies CP/palp/SOB/HA/fevers/GI or GU c/o. Taking meds as prescribed  Past Medical History:  Diagnosis Date  . Allergic state 06/26/2017  . Anemia 05/21/2017  . Arthritis   . Atrial tachycardia ([place])    a. asymptomatic. Noted on tele during 11/2014 admission   . CAD (coronary artery disease)    a. 11/2014 inf STEMI s/p DES to RCA; 50-70% ruptured plaque in pLAD- rx medically  . GERD (gastroesophageal reflux disease)   . HTN (hypertension)   . Hyperglycemia   . Hyperlipidemia   . Hypothyroidism   . Insomnia 05/27/2017  . Ischemic cardiomyopathy    a. 11/2014  LV gram with inferior hypokinesis. EF 45-50%.  . Nocturia 01/08/2017  . Obesity 06/26/2017  . Osteoporosis   . PAF (paroxysmal atrial fibrillation) ([place])    a. isolated episode of atrial fibrillation several years ago associated w/ her thyroid dysfunction (prior to ablation).   . Prediabetes   .  Preventative health care 06/26/2017  . Vasomotor rhinitis 06/26/2017    Past Surgical History:  Procedure Laterality Date  . BREAST SURGERY     breast biospy  . CATARACT EXTRACTION Bilateral   . COLONOSCOPY    . EYE SURGERY Bilateral 2005, 2006   Cataract with lens ImplaNT   . INGUINAL HERNIA REPAIR Right   . LEFT HEART CATHETERIZATION WITH CORONARY ANGIOGRAM N/A 12/02/2014   Procedure: LEFT HEART CATHETERIZATION WITH CORONARY ANGIOGRAM;  Surgeon: Sinclair Grooms, MD;  Location: [facility] CATH LAB;  Service: Cardiovascular;  Laterality: N/A;  . ORIF HUMERUS FRACTURE Right 01/27/2014   Procedure: RIGHT OPEN REDUCTION INTERNAL FIXATION (ORIF) PROXIMAL HUMERUS FRACTURE;  Surgeon: Rozanna Box, MD;  Location: [facility];  Service: Orthopedics;  Laterality: Right;  . TOTAL KNEE ARTHROPLASTY Right 2006  . TOTAL KNEE ARTHROPLASTY Left 2011  . UMBILICAL HERNIA REPAIR     as a baby    Family History  Problem Relation Age of Onset  . CAD Mother   . Heart disease Mother   . Heart failure Mother   . Polycythemia Father   . Multiple myeloma Father   . Prostate cancer Paternal Grandfather   . Breast cancer Unknown   . Arthritis Maternal Aunt   . Breast cancer Maternal Aunt   . Stroke Sister   . Hyperlipidemia Sister   . Hypertension Sister   . Heart disease Brother  bradycardia  . Hypertension Brother     Social History   Social History  . Marital status: Married    Spouse name: N/A  . Number of children: N/A  . Years of education: N/A   Occupational History  . retired     Social History Main Topics  . Smoking status: Never Smoker  . Smokeless tobacco: Never Used  . Alcohol use No  . Drug use: No  . Sexual activity: Not on file   Other Topics Concern  . Not on file   Social History Narrative  . No narrative on file    Outpatient Medications Prior to Visit  Medication Sig Dispense Refill  . acetaminophen (TYLENOL) 500 MG tablet Take 1-2 tablets (500-1,000 mg total) by  mouth every 6 (six) hours as needed for moderate pain or fever. 90 tablet 0  . apixaban (ELIQUIS) 5 MG TABS tablet Take 1 tablet (5 mg total) by mouth 2 (two) times daily. 180 tablet 3  . B Complex Vitamins (B COMPLEX PO) Take 1 capsule by mouth daily.    . Calcium-Magnesium-Vitamin D (CALCIUM MAGNESIUM PO) Take 1 capsule by mouth daily.    . cetirizine (ZYRTEC) 10 MG tablet Take 10 mg by mouth at bedtime.    . Cholecalciferol (VITAMIN D) 2000 UNITS tablet Take 4,000 Units by mouth daily.    . Coenzyme Q10 200 MG TABS Take 200 mg by mouth daily.    Marland Kitchen levothyroxine (SYNTHROID, LEVOTHROID) 88 MCG tablet Take 1 tablet (88 mcg total) by mouth daily. 90 tablet 1  . LORazepam (ATIVAN) 0.5 MG tablet Take 0.5 mg by mouth at bedtime as needed for sleep.    . metoprolol tartrate (LOPRESSOR) 25 MG tablet TAKE 1 TABLET (25 MG TOTAL) BY MOUTH 2 (TWO) TIMES DAILY. 180 tablet 3  . nitroGLYCERIN (NITROSTAT) 0.4 MG SL tablet PLACE 1 TABLET (0.4 MG TOTAL) UNDER THE TONGUE EVERY 5 (FIVE) MINUTES AS NEEDED FOR CHEST PAIN. 25 tablet 0  . rosuvastatin (CRESTOR) 40 MG tablet Take 20 mg by mouth daily.    Marland Kitchen amoxicillin (AMOXIL) 500 MG tablet Take 4 tablets by mouth as needed. (Take one (1) hour prior to dental procedures)  3  . amoxicillin-clavulanate (AUGMENTIN XR) 1000-62.5 MG 12 hr tablet Take 2 tablets by mouth 2 (two) times daily. Take for 5 days 10 tablet 0  . ciprofloxacin (CIPRO) 250 MG tablet Take 1 tablet (250 mg total) by mouth 2 (two) times daily. 10 tablet 0   No facility-administered medications prior to visit.     Allergies  Allergen Reactions  . Keflex [Cephalexin] Rash    Review of Systems  Constitutional: Negative for chills, fever and malaise/fatigue.  HENT: Positive for congestion. Negative for hearing loss and sinus pain.   Eyes: Negative for discharge.  Respiratory: Negative for cough, sputum production and shortness of breath.   Cardiovascular: Negative for chest pain, palpitations and  leg swelling.  Gastrointestinal: Negative for abdominal pain, blood in stool, constipation, diarrhea, heartburn, nausea and vomiting.  Genitourinary: Negative for dysuria, frequency, hematuria and urgency.  Musculoskeletal: Negative for back pain, falls and myalgias.  Skin: Negative for rash.  Neurological: Negative for dizziness, sensory change, loss of consciousness, weakness and headaches.  Endo/Heme/Allergies: Negative for environmental allergies. Does not bruise/bleed easily.  Psychiatric/Behavioral: Negative for depression and suicidal ideas. The patient is not nervous/anxious and does not have insomnia.        Objective:    Physical Exam  Constitutional: She is oriented to  person, place, and time. She appears well-developed and well-nourished. No distress.  HENT:  Head: Normocephalic and atraumatic.  Eyes: Conjunctivae are normal.  Neck: Neck supple. No thyromegaly present.  Cardiovascular: Normal rate, regular rhythm and normal heart sounds.   No murmur heard. Pulmonary/Chest: Effort normal and breath sounds normal. No respiratory distress.  Abdominal: Soft. Bowel sounds are normal. She exhibits no distension and no mass. There is no tenderness.  Musculoskeletal: She exhibits no edema.  Lymphadenopathy:    She has no cervical adenopathy.  Neurological: She is alert and oriented to person, place, and time.  Skin: Skin is warm and dry.  Psychiatric: She has a normal mood and affect. Her behavior is normal.    BP 132/76 (BP Location: Left Arm, Patient Position: Sitting, Cuff Size: Normal)   Pulse (!) 57   Temp 97.7 F (36.5 C) (Oral)   Ht 5' 2.5" (1.588 m)   Wt 207 lb 9.6 oz (94.2 kg)   SpO2 99%   BMI 37.37 kg/m  Wt Readings from Last 3 Encounters:  06/26/17 207 lb 9.6 oz (94.2 kg)  05/21/17 207 lb 6.4 oz (94.1 kg)  03/20/17 203 lb 12.8 oz (92.4 kg)     Lab Results  Component Value Date   WBC 4.9 05/21/2017   HGB 11.8 (L) 05/21/2017   HCT 35.6 (L) 05/21/2017    PLT 188.0 05/21/2017   GLUCOSE 104 (H) 05/21/2017   CHOL 182 01/08/2017   TRIG 95.0 01/08/2017   HDL 77.70 01/08/2017   LDLCALC 85 01/08/2017   ALT 17 05/21/2017   AST 25 05/21/2017   NA 137 05/21/2017   K 4.1 05/21/2017   CL 104 05/21/2017   CREATININE 0.95 05/21/2017   BUN 16 05/21/2017   CO2 26 05/21/2017   TSH 2.03 05/21/2017   INR 1.05 01/27/2014   HGBA1C 6.3 01/08/2017    Lab Results  Component Value Date   TSH 2.03 05/21/2017   Lab Results  Component Value Date   WBC 4.9 05/21/2017   HGB 11.8 (L) 05/21/2017   HCT 35.6 (L) 05/21/2017   MCV 95.3 05/21/2017   PLT 188.0 05/21/2017   Lab Results  Component Value Date   NA 137 05/21/2017   K 4.1 05/21/2017   CO2 26 05/21/2017   GLUCOSE 104 (H) 05/21/2017   BUN 16 05/21/2017   CREATININE 0.95 05/21/2017   BILITOT 0.6 05/21/2017   ALKPHOS 55 05/21/2017   AST 25 05/21/2017   ALT 17 05/21/2017   PROT 7.1 05/21/2017   ALBUMIN 4.0 05/21/2017   CALCIUM 9.0 05/21/2017   ANIONGAP 7 04/15/2015   GFR 60.85 05/21/2017   Lab Results  Component Value Date   CHOL 182 01/08/2017   Lab Results  Component Value Date   HDL 77.70 01/08/2017   Lab Results  Component Value Date   LDLCALC 85 01/08/2017   Lab Results  Component Value Date   TRIG 95.0 01/08/2017   Lab Results  Component Value Date   CHOLHDL 2 01/08/2017   Lab Results  Component Value Date   HGBA1C 6.3 01/08/2017       Assessment & Plan:   Problem List Items Addressed This Visit    Hypothyroidism    On Levothyroxine, continue to monitor      Relevant Orders   TSH   Hyperglycemia    minimize simple carbs. Increase exercise as tolerated.       Relevant Orders   Hemoglobin A1c   HTN (hypertension)  Well controlled, no changes to meds. Encouraged heart healthy diet such as the DASH diet and exercise as tolerated.       Relevant Orders   CBC   Comprehensive metabolic panel   TSH   Osteoporosis    Encouraged to get adequate  exercise, calcium and vitamin d intake. Rel of rec for last bone density test done at [facility]      Atrial tachycardia ([place])    1 episode of afib last 2-3 hours back in July. Only one episode of afib this year. Is being maintained on Metoprolol with good results.      Hematuria    Check UA, had a recent UTI with [facility], asymptomatic      Relevant Orders   Urinalysis   Urine Culture   Anemia    Increase leafy greens, consider increased lean red meat and using cast iron cookware. Continue to monitor, report any concerns      Relevant Orders   CBC   Hyperlipidemia    Tolerating statin, encouraged heart healthy diet, avoid trans fats, minimize simple carbs and saturated fats. Increase exercise as tolerated      Relevant Orders   Lipid panel   Vasomotor rhinitis    Declines [place] she will let u know fi she changes her mind and wants to proceed      Preventative health care    Patient encouraged to maintain heart healthy diet, regular exercise, adequate sleep. Consider daily probiotics. Take medications as prescribed. Check labs today. MGM utd checked breast exam today.       Allergic state    Cetirizine no longer helping enough so can try twice daily on that if not helpful try Allegra 180 mg daily      Obesity    Encouraged DASH diet, decrease po intake and increase exercise as tolerated. Needs 7-8 hours of sleep nightly. Avoid trans fats, eat small, frequent meals every 4-5 hours with lean proteins, complex carbs and healthy fats. Minimize simple carbs, declines bariatric referral       Other Visit Diagnoses    Encounter for immunization       Relevant Orders   Flu vaccine HIGH DOSE PF (Completed)   Lipid panel   CBC   Comprehensive metabolic panel   TSH   Urinalysis   Urine Culture      I have discontinued Ms. Pikus's ciprofloxacin and amoxicillin-clavulanate. I am also having her maintain her cetirizine, Vitamin D, Calcium-Magnesium-Vitamin D (CALCIUM MAGNESIUM  PO), acetaminophen, Coenzyme Q10, LORazepam, rosuvastatin, nitroGLYCERIN, metoprolol tartrate, amoxicillin, B Complex Vitamins (B COMPLEX PO), apixaban, and levothyroxine.  No orders of the defined types were placed in this encounter.    Penni Homans, MD

## 2017-06-26 NOTE — Assessment & Plan Note (Signed)
minimize simple carbs. Increase exercise as tolerated.  

## 2017-06-28 LAB — URINE CULTURE

## 2017-06-29 ENCOUNTER — Telehealth: Payer: Self-pay | Admitting: Interventional Cardiology

## 2017-06-29 DIAGNOSIS — S43035A Inferior dislocation of left humerus, initial encounter: Secondary | ICD-10-CM | POA: Diagnosis not present

## 2017-06-29 DIAGNOSIS — M25522 Pain in left elbow: Secondary | ICD-10-CM | POA: Diagnosis not present

## 2017-06-29 DIAGNOSIS — M25512 Pain in left shoulder: Secondary | ICD-10-CM | POA: Diagnosis not present

## 2017-06-29 DIAGNOSIS — Z8781 Personal history of (healed) traumatic fracture: Secondary | ICD-10-CM | POA: Diagnosis not present

## 2017-06-29 DIAGNOSIS — Z9181 History of falling: Secondary | ICD-10-CM | POA: Diagnosis not present

## 2017-06-29 DIAGNOSIS — M19012 Primary osteoarthritis, left shoulder: Secondary | ICD-10-CM | POA: Diagnosis not present

## 2017-06-29 DIAGNOSIS — S43015A Anterior dislocation of left humerus, initial encounter: Secondary | ICD-10-CM | POA: Diagnosis not present

## 2017-06-29 DIAGNOSIS — M25562 Pain in left knee: Secondary | ICD-10-CM | POA: Diagnosis not present

## 2017-06-29 DIAGNOSIS — M25712 Osteophyte, left shoulder: Secondary | ICD-10-CM | POA: Diagnosis not present

## 2017-06-29 DIAGNOSIS — S4992XA Unspecified injury of left shoulder and upper arm, initial encounter: Secondary | ICD-10-CM | POA: Diagnosis not present

## 2017-06-29 DIAGNOSIS — R2232 Localized swelling, mass and lump, left upper limb: Secondary | ICD-10-CM | POA: Diagnosis not present

## 2017-06-29 DIAGNOSIS — Z96653 Presence of artificial knee joint, bilateral: Secondary | ICD-10-CM | POA: Diagnosis not present

## 2017-06-29 NOTE — Telephone Encounter (Signed)
Mrs. Sindt  Is calling because she fell in her living room about 2am this morning and thinks her arm is broken . She is currently on Eliquis and is wanting to know what should she do about taking the medication just in case the Orthopedic doctor suggest that she has to have surgery . Please call

## 2017-06-29 NOTE — Telephone Encounter (Signed)
Pt currently trying to get in contact with Ortho about getting an appt.  Advised pt to continue with Eliquis.  Advised if Ortho felt surgery was appropriate they would take proper measures in regards to her anticoags.  Pt verbalized understanding and was appreciative for call.

## 2017-07-02 DIAGNOSIS — S43085A Other dislocation of left shoulder joint, initial encounter: Secondary | ICD-10-CM | POA: Diagnosis not present

## 2017-07-04 ENCOUNTER — Telehealth: Payer: Self-pay | Admitting: Family Medicine

## 2017-07-04 NOTE — Telephone Encounter (Signed)
Pt brought medical records chart from old PCP. Pt states she does not need these records back. Pt states they can be destroyed once Charlett Blake has reviewed them and copied what she needs. Placed chart records in physician tray in the front office check in area.

## 2017-07-05 ENCOUNTER — Telehealth: Payer: Self-pay | Admitting: *Deleted

## 2017-07-05 NOTE — Telephone Encounter (Signed)
Received Medical records in form of large 'Paper Chart' with mixed & tab logged medical information; forwarded to provider's assistant, Princess/SLS 09/13

## 2017-07-09 DIAGNOSIS — S43085A Other dislocation of left shoulder joint, initial encounter: Secondary | ICD-10-CM | POA: Diagnosis not present

## 2017-07-16 DIAGNOSIS — S43085D Other dislocation of left shoulder joint, subsequent encounter: Secondary | ICD-10-CM | POA: Diagnosis not present

## 2017-08-06 DIAGNOSIS — S43085D Other dislocation of left shoulder joint, subsequent encounter: Secondary | ICD-10-CM | POA: Diagnosis not present

## 2017-08-10 DIAGNOSIS — M25512 Pain in left shoulder: Secondary | ICD-10-CM | POA: Diagnosis not present

## 2017-08-10 DIAGNOSIS — M25612 Stiffness of left shoulder, not elsewhere classified: Secondary | ICD-10-CM | POA: Diagnosis not present

## 2017-08-10 DIAGNOSIS — S43005D Unspecified dislocation of left shoulder joint, subsequent encounter: Secondary | ICD-10-CM | POA: Diagnosis not present

## 2017-08-11 ENCOUNTER — Encounter: Payer: Self-pay | Admitting: Family Medicine

## 2017-08-14 DIAGNOSIS — M25512 Pain in left shoulder: Secondary | ICD-10-CM | POA: Diagnosis not present

## 2017-08-14 DIAGNOSIS — S43005D Unspecified dislocation of left shoulder joint, subsequent encounter: Secondary | ICD-10-CM | POA: Diagnosis not present

## 2017-08-14 DIAGNOSIS — M25612 Stiffness of left shoulder, not elsewhere classified: Secondary | ICD-10-CM | POA: Diagnosis not present

## 2017-08-15 NOTE — Telephone Encounter (Signed)
Pt called to confirm she takes 0.5 Lorazepam and she uses [facility] in [place]. Pt states she is unsure if it is time for her to come in for UDS and she will wait to hear from Charlett Blake if she needs UDS and if she needs to come onsite to pick up the prescription.

## 2017-08-16 DIAGNOSIS — M25612 Stiffness of left shoulder, not elsewhere classified: Secondary | ICD-10-CM | POA: Diagnosis not present

## 2017-08-16 DIAGNOSIS — M25512 Pain in left shoulder: Secondary | ICD-10-CM | POA: Diagnosis not present

## 2017-08-16 DIAGNOSIS — S43005D Unspecified dislocation of left shoulder joint, subsequent encounter: Secondary | ICD-10-CM | POA: Diagnosis not present

## 2017-08-17 NOTE — Telephone Encounter (Signed)
Pt still has not heard anything about LORAZEPAM request from 08/11/17. Please contact pt. Pt cannot come today to pick up the script but can come Monday to pick it up if needed unless we can fax it in for her. CALL PT let her know it is ready for pick up.

## 2017-08-20 ENCOUNTER — Other Ambulatory Visit: Payer: Self-pay

## 2017-08-20 MED ORDER — LORAZEPAM 0.5 MG PO TABS: 0.5000 mg | ORAL_TABLET | Freq: Every evening | ORAL | 1 refills | Status: DC | PRN

## 2017-08-20 NOTE — Telephone Encounter (Signed)
Relation to UJ:WJXB Call back number:[phone]    Reason for call:  Patient checking on the status of message below, patient would like LORazepam (ATIVAN) 0.5 MG tablet   [place], [place] [phone] (Phone) [phone] (Fax)   Please advise patient when Rx is sent in please.

## 2017-08-21 DIAGNOSIS — M25612 Stiffness of left shoulder, not elsewhere classified: Secondary | ICD-10-CM | POA: Diagnosis not present

## 2017-08-21 DIAGNOSIS — M25512 Pain in left shoulder: Secondary | ICD-10-CM | POA: Diagnosis not present

## 2017-08-21 DIAGNOSIS — S43005D Unspecified dislocation of left shoulder joint, subsequent encounter: Secondary | ICD-10-CM | POA: Diagnosis not present

## 2017-08-21 NOTE — Telephone Encounter (Signed)
Princess advised, patient informed

## 2017-08-21 NOTE — Telephone Encounter (Signed)
Patient called back to inform Rx was sent to the wrong pharmacy, patient picked up rX and wanted to know why it was only refilled for 30 days with 1 refill. Patient last seen 06/26/17 due to follow up 12/27/17, please advise

## 2017-08-23 DIAGNOSIS — M25512 Pain in left shoulder: Secondary | ICD-10-CM | POA: Diagnosis not present

## 2017-08-23 DIAGNOSIS — S43005D Unspecified dislocation of left shoulder joint, subsequent encounter: Secondary | ICD-10-CM | POA: Diagnosis not present

## 2017-08-23 DIAGNOSIS — M25612 Stiffness of left shoulder, not elsewhere classified: Secondary | ICD-10-CM | POA: Diagnosis not present

## 2017-08-24 ENCOUNTER — Telehealth: Payer: Self-pay | Admitting: Interventional Cardiology

## 2017-08-24 NOTE — Telephone Encounter (Signed)
Will forward to [facility]. 

## 2017-08-24 NOTE — Telephone Encounter (Signed)
Conrad for pt to have cortisone shot with her Eliquis. Called pt back and she is aware.

## 2017-08-24 NOTE — Telephone Encounter (Signed)
New message    Pt is calling.   Pt c/o medication issue:  1. Name of Medication: eliquis   2. How are you currently taking this medication (dosage and times per day)? 5 mg  3. Are you having a reaction (difficulty breathing--STAT)? no  4. What is your medication issue? Pt wants to know if she can have a cortisone shot with the medication. She feel and broke her arm and is now in therapy but its causing a lot of pain so she would like shot if she can get it.

## 2017-08-27 ENCOUNTER — Telehealth: Payer: Self-pay | Admitting: Interventional Cardiology

## 2017-08-27 NOTE — Telephone Encounter (Signed)
Pt may have Cortisone shot in shoulder for pain from fall. Pt already received call letting her know no interactions with Eliquis and Cortisone shot.  Pt concerned that shot may throw her into Afib.  Advised I have never heard of this happening but pt should contact office doing the injection to see if this is a possible side effect.  Pt verbalized understanding.

## 2017-08-27 NOTE — Telephone Encounter (Signed)
Wendy Munoz is calling because she take Eliquis and is wanting to get a Cortizone shot to help with the pain ( She took a fall ) and was told that it will not interfere with the Eliquis, but forgot to mention that she is prone to have AFIB . Please call

## 2017-08-28 DIAGNOSIS — M25512 Pain in left shoulder: Secondary | ICD-10-CM | POA: Diagnosis not present

## 2017-08-28 DIAGNOSIS — M25612 Stiffness of left shoulder, not elsewhere classified: Secondary | ICD-10-CM | POA: Diagnosis not present

## 2017-08-28 DIAGNOSIS — S43005D Unspecified dislocation of left shoulder joint, subsequent encounter: Secondary | ICD-10-CM | POA: Diagnosis not present

## 2017-08-29 DIAGNOSIS — M25512 Pain in left shoulder: Secondary | ICD-10-CM | POA: Diagnosis not present

## 2017-08-29 DIAGNOSIS — G8929 Other chronic pain: Secondary | ICD-10-CM | POA: Diagnosis not present

## 2017-08-29 DIAGNOSIS — S43005D Unspecified dislocation of left shoulder joint, subsequent encounter: Secondary | ICD-10-CM | POA: Diagnosis not present

## 2017-08-30 DIAGNOSIS — M25512 Pain in left shoulder: Secondary | ICD-10-CM | POA: Diagnosis not present

## 2017-08-30 DIAGNOSIS — M25612 Stiffness of left shoulder, not elsewhere classified: Secondary | ICD-10-CM | POA: Diagnosis not present

## 2017-08-30 DIAGNOSIS — S43005D Unspecified dislocation of left shoulder joint, subsequent encounter: Secondary | ICD-10-CM | POA: Diagnosis not present

## 2017-09-04 DIAGNOSIS — M25512 Pain in left shoulder: Secondary | ICD-10-CM | POA: Diagnosis not present

## 2017-09-04 DIAGNOSIS — S43005D Unspecified dislocation of left shoulder joint, subsequent encounter: Secondary | ICD-10-CM | POA: Diagnosis not present

## 2017-09-04 DIAGNOSIS — M25612 Stiffness of left shoulder, not elsewhere classified: Secondary | ICD-10-CM | POA: Diagnosis not present

## 2017-09-06 DIAGNOSIS — M25612 Stiffness of left shoulder, not elsewhere classified: Secondary | ICD-10-CM | POA: Diagnosis not present

## 2017-09-06 DIAGNOSIS — M25512 Pain in left shoulder: Secondary | ICD-10-CM | POA: Diagnosis not present

## 2017-09-06 DIAGNOSIS — S43005D Unspecified dislocation of left shoulder joint, subsequent encounter: Secondary | ICD-10-CM | POA: Diagnosis not present

## 2017-09-11 ENCOUNTER — Other Ambulatory Visit: Payer: Self-pay | Admitting: Family Medicine

## 2017-09-11 DIAGNOSIS — M25612 Stiffness of left shoulder, not elsewhere classified: Secondary | ICD-10-CM | POA: Diagnosis not present

## 2017-09-11 DIAGNOSIS — S43005D Unspecified dislocation of left shoulder joint, subsequent encounter: Secondary | ICD-10-CM | POA: Diagnosis not present

## 2017-09-11 DIAGNOSIS — M25512 Pain in left shoulder: Secondary | ICD-10-CM | POA: Diagnosis not present

## 2017-09-12 DIAGNOSIS — M25512 Pain in left shoulder: Secondary | ICD-10-CM | POA: Diagnosis not present

## 2017-09-12 DIAGNOSIS — S43005D Unspecified dislocation of left shoulder joint, subsequent encounter: Secondary | ICD-10-CM | POA: Diagnosis not present

## 2017-09-12 DIAGNOSIS — M25612 Stiffness of left shoulder, not elsewhere classified: Secondary | ICD-10-CM | POA: Diagnosis not present

## 2017-09-17 DIAGNOSIS — M25612 Stiffness of left shoulder, not elsewhere classified: Secondary | ICD-10-CM | POA: Diagnosis not present

## 2017-09-17 DIAGNOSIS — M25512 Pain in left shoulder: Secondary | ICD-10-CM | POA: Diagnosis not present

## 2017-09-17 DIAGNOSIS — S43005D Unspecified dislocation of left shoulder joint, subsequent encounter: Secondary | ICD-10-CM | POA: Diagnosis not present

## 2017-09-20 DIAGNOSIS — M25612 Stiffness of left shoulder, not elsewhere classified: Secondary | ICD-10-CM | POA: Diagnosis not present

## 2017-09-20 DIAGNOSIS — S43005D Unspecified dislocation of left shoulder joint, subsequent encounter: Secondary | ICD-10-CM | POA: Diagnosis not present

## 2017-09-20 DIAGNOSIS — M25512 Pain in left shoulder: Secondary | ICD-10-CM | POA: Diagnosis not present

## 2017-09-25 ENCOUNTER — Telehealth: Payer: Self-pay | Admitting: Interventional Cardiology

## 2017-09-25 DIAGNOSIS — M25512 Pain in left shoulder: Secondary | ICD-10-CM | POA: Diagnosis not present

## 2017-09-25 DIAGNOSIS — S43005D Unspecified dislocation of left shoulder joint, subsequent encounter: Secondary | ICD-10-CM | POA: Diagnosis not present

## 2017-09-25 DIAGNOSIS — M25612 Stiffness of left shoulder, not elsewhere classified: Secondary | ICD-10-CM | POA: Diagnosis not present

## 2017-09-25 NOTE — Telephone Encounter (Signed)
Spoke with patient and made her aware that we would not be able to provide her with enough samples to get her through until the beginning of the year. I offered her two weeks and she stated that any amount would help. I will place these at the front desk. Patient appreciative.

## 2017-09-25 NOTE — Telephone Encounter (Signed)
New Message     Patient calling the office for samples of medication:   1.  What medication and dosage are you requesting samples for? eliquis 5 mg   2.  Are you currently out of this medication?  In donut hole, need enough samples to get her through

## 2017-09-26 DIAGNOSIS — G8929 Other chronic pain: Secondary | ICD-10-CM | POA: Diagnosis not present

## 2017-09-26 DIAGNOSIS — S43085D Other dislocation of left shoulder joint, subsequent encounter: Secondary | ICD-10-CM | POA: Diagnosis not present

## 2017-09-26 DIAGNOSIS — M25512 Pain in left shoulder: Secondary | ICD-10-CM | POA: Diagnosis not present

## 2017-09-27 DIAGNOSIS — S43005D Unspecified dislocation of left shoulder joint, subsequent encounter: Secondary | ICD-10-CM | POA: Diagnosis not present

## 2017-09-27 DIAGNOSIS — M25512 Pain in left shoulder: Secondary | ICD-10-CM | POA: Diagnosis not present

## 2017-09-27 DIAGNOSIS — M25612 Stiffness of left shoulder, not elsewhere classified: Secondary | ICD-10-CM | POA: Diagnosis not present

## 2017-10-29 ENCOUNTER — Other Ambulatory Visit: Payer: Self-pay | Admitting: Family Medicine

## 2017-10-29 DIAGNOSIS — Z79899 Other long term (current) drug therapy: Secondary | ICD-10-CM

## 2017-10-29 NOTE — Telephone Encounter (Signed)
Routed back to provider 

## 2017-10-29 NOTE — Telephone Encounter (Signed)
Requesting: ativan Contract:no UDS:no Last OV:06/26/17 Next OV:12/27/17 Last Refill:08/20/17 [id number]   Please advise

## 2017-10-29 NOTE — Telephone Encounter (Signed)
OK to refill the Ativan with same sig, same number one refill but needs contract and UDS

## 2017-10-29 NOTE — Telephone Encounter (Signed)
Copied from [place]. Topic: Quick Communication - See Telephone Encounter >> Oct 29, 2017 12:16 PM Aurelio Brash B wrote: CRM for notification. See Telephone encounter for:  Refill  LORazepam (ATIVAN) 0.5 MG tablet  down to one pill  [facility] 10/29/17.

## 2017-10-30 MED ORDER — LORAZEPAM 0.5 MG PO TABS: 0.5000 mg | ORAL_TABLET | Freq: Every evening | ORAL | 1 refills | Status: DC | PRN

## 2017-10-31 ENCOUNTER — Other Ambulatory Visit: Payer: PPO

## 2017-10-31 DIAGNOSIS — Z79899 Other long term (current) drug therapy: Secondary | ICD-10-CM

## 2017-11-02 NOTE — Telephone Encounter (Signed)
Patient notified

## 2017-11-05 LAB — PAIN MGMT, PROFILE 8 W/CONF, U
6 ACETYLMORPHINE: NEGATIVE ng/mL (ref <10)
ALPHAHYDROXYMIDAZOLAM: NEGATIVE ng/mL (ref <50)
ALPHAHYDROXYTRIAZOLAM: NEGATIVE ng/mL (ref <50)
AMPHETAMINES: NEGATIVE ng/mL (ref <500)
Alcohol Metabolites: NEGATIVE ng/mL (ref <500)
Alphahydroxyalprazolam: NEGATIVE ng/mL (ref <25)
Aminoclonazepam: NEGATIVE ng/mL (ref <25)
Benzodiazepines: POSITIVE ng/mL — AB (ref <100)
Buprenorphine, Urine: NEGATIVE ng/mL (ref <5)
CREATININE: 133.9 mg/dL
Cocaine Metabolite: NEGATIVE ng/mL (ref <150)
HYDROXYETHYLFLURAZEPAM: NEGATIVE ng/mL (ref <50)
Lorazepam: 461 ng/mL — ABNORMAL HIGH (ref <50)
MARIJUANA METABOLITE: NEGATIVE ng/mL (ref <20)
MDMA: NEGATIVE ng/mL (ref <500)
NORDIAZEPAM: NEGATIVE ng/mL (ref <50)
OPIATES: NEGATIVE ng/mL (ref <100)
OXAZEPAM: NEGATIVE ng/mL (ref <50)
OXIDANT: NEGATIVE ug/mL (ref <200)
OXYCODONE: NEGATIVE ng/mL (ref <100)
PH: 7.3 (ref 4.5–9.0)
Temazepam: NEGATIVE ng/mL (ref <50)

## 2017-12-27 ENCOUNTER — Ambulatory Visit (INDEPENDENT_AMBULATORY_CARE_PROVIDER_SITE_OTHER): Payer: PPO | Admitting: Family Medicine

## 2017-12-27 ENCOUNTER — Telehealth: Payer: Self-pay | Admitting: Family Medicine

## 2017-12-27 VITALS — BP 110/66 | HR 57 | Temp 97.6°F | Resp 18 | Wt 209.0 lb

## 2017-12-27 DIAGNOSIS — E039 Hypothyroidism, unspecified: Secondary | ICD-10-CM | POA: Diagnosis not present

## 2017-12-27 DIAGNOSIS — M810 Age-related osteoporosis without current pathological fracture: Secondary | ICD-10-CM | POA: Diagnosis not present

## 2017-12-27 DIAGNOSIS — Z79899 Other long term (current) drug therapy: Secondary | ICD-10-CM

## 2017-12-27 DIAGNOSIS — E785 Hyperlipidemia, unspecified: Secondary | ICD-10-CM

## 2017-12-27 DIAGNOSIS — R739 Hyperglycemia, unspecified: Secondary | ICD-10-CM | POA: Diagnosis not present

## 2017-12-27 DIAGNOSIS — I10 Essential (primary) hypertension: Secondary | ICD-10-CM

## 2017-12-27 DIAGNOSIS — R21 Rash and other nonspecific skin eruption: Secondary | ICD-10-CM

## 2017-12-27 DIAGNOSIS — B001 Herpesviral vesicular dermatitis: Secondary | ICD-10-CM | POA: Diagnosis not present

## 2017-12-27 LAB — CBC
HEMATOCRIT: 37.4 % (ref 36.0–46.0)
Hemoglobin: 12.5 g/dL (ref 12.0–15.0)
MCHC: 33.6 g/dL (ref 30.0–36.0)
MCV: 95.3 fl (ref 78.0–100.0)
PLATELETS: 203 10*3/uL (ref 150.0–400.0)
RBC: 3.92 Mil/uL (ref 3.87–5.11)
RDW: 13.6 % (ref 11.5–15.5)
WBC: 5.6 10*3/uL (ref 4.0–10.5)

## 2017-12-27 LAB — COMPREHENSIVE METABOLIC PANEL
ALT: 14 U/L (ref 0–35)
AST: 20 U/L (ref 0–37)
Albumin: 4.3 g/dL (ref 3.5–5.2)
Alkaline Phosphatase: 61 U/L (ref 39–117)
BUN: 18 mg/dL (ref 6–23)
CALCIUM: 9.7 mg/dL (ref 8.4–10.5)
CHLORIDE: 104 meq/L (ref 96–112)
CO2: 24 meq/L (ref 19–32)
Creatinine, Ser: 1.13 mg/dL (ref 0.40–1.20)
GFR: 49.73 mL/min — AB (ref >60.00)
Glucose, Bld: 101 mg/dL — ABNORMAL HIGH (ref 70–99)
Potassium: 3.9 mEq/L (ref 3.5–5.1)
Sodium: 137 mEq/L (ref 135–145)
Total Bilirubin: 0.7 mg/dL (ref 0.2–1.2)
Total Protein: 7.5 g/dL (ref 6.0–8.3)

## 2017-12-27 LAB — LIPID PANEL
CHOL/HDL RATIO: 2
Cholesterol: 181 mg/dL (ref 0–200)
HDL: 98.6 mg/dL (ref >39.00)
LDL CALC: 57 mg/dL (ref 0–99)
NONHDL: 82.22
Triglycerides: 126 mg/dL (ref 0.0–149.0)
VLDL: 25.2 mg/dL (ref 0.0–40.0)

## 2017-12-27 LAB — TSH: TSH: 2.11 u[IU]/mL (ref 0.35–4.50)

## 2017-12-27 LAB — HEMOGLOBIN A1C: Hgb A1c MFr Bld: 6.2 % (ref 4.6–6.5)

## 2017-12-27 LAB — VITAMIN D 25 HYDROXY (VIT D DEFICIENCY, FRACTURES): VITD: 40.5 ng/mL (ref 30.00–100.00)

## 2017-12-27 MED ORDER — ROSUVASTATIN CALCIUM 40 MG PO TABS: 20.0000 mg | ORAL_TABLET | Freq: Every day | ORAL | 1 refills | Status: DC

## 2017-12-27 MED ORDER — NYSTATIN 100000 UNIT/GM EX CREA: 1.0000 "application " | TOPICAL_CREAM | Freq: Two times a day (BID) | CUTANEOUS | 0 refills | Status: DC | PRN

## 2017-12-27 MED ORDER — ACYCLOVIR 5 % EX OINT: TOPICAL_OINTMENT | CUTANEOUS | 1 refills | Status: DC

## 2017-12-27 NOTE — Assessment & Plan Note (Signed)
On Levothyroxine, continue to monitor 

## 2017-12-27 NOTE — Assessment & Plan Note (Signed)
Under breasts and panus, cleanse with mild soap, Witch Hazel Astringent and blow dry and then apply the Nystatin

## 2017-12-27 NOTE — Assessment & Plan Note (Signed)
Well controlled, no changes to meds. Encouraged heart healthy diet such as the DASH diet and exercise as tolerated.  °

## 2017-12-27 NOTE — Assessment & Plan Note (Signed)
Vitamin D level check today

## 2017-12-27 NOTE — Progress Notes (Signed)
Subjective:  I acted as a Education administrator for Dr. Charlett Blake. [place], [place]  Patient ID: Wendy Munoz, female    DOB: January 02, 1941, 77 y.o.   MRN: [id number]  No chief complaint on file.   HPI  Patient is in today for 6 month follow up and she is having trouble with a itchy uncomfortable rash under breasts. She has used some OTC antifungals and it has helped only slightly. She finds the powder most helpful. No recent hospitalizations or febrile illness. She has had a dry cough and some pressure in her ears at times. No fevers, chills, sore throat, headache or other acute concerns. Denies CP/palp/SOB/HA/congestion/fevers/GI or GU c/o. Taking meds as prescribed  Patient Care Team: Mosie Lukes, MD as PCP - General (Family Medicine)   Past Medical History:  Diagnosis Date  . Allergic state 06/26/2017  . Anemia 05/21/2017  . Arthritis   . Atrial tachycardia ([place])    a. asymptomatic. Noted on tele during 11/2014 admission   . CAD (coronary artery disease)    a. 11/2014 inf STEMI s/p DES to RCA; 50-70% ruptured plaque in pLAD- rx medically  . GERD (gastroesophageal reflux disease)   . HTN (hypertension)   . Hyperglycemia   . Hyperlipidemia   . Hypothyroidism   . Insomnia 05/27/2017  . Ischemic cardiomyopathy    a. 11/2014  LV gram with inferior hypokinesis. EF 45-50%.  . Nocturia 01/08/2017  . Obesity 06/26/2017  . Osteoporosis   . PAF (paroxysmal atrial fibrillation) ([place])    a. isolated episode of atrial fibrillation several years ago associated w/ her thyroid dysfunction (prior to ablation).   . Prediabetes   . Preventative health care 06/26/2017  . Vasomotor rhinitis 06/26/2017    Past Surgical History:  Procedure Laterality Date  . BREAST SURGERY     breast biospy  . CATARACT EXTRACTION Bilateral   . COLONOSCOPY    . EYE SURGERY Bilateral 2005, 2006   Cataract with lens ImplaNT   . INGUINAL HERNIA REPAIR Right   . LEFT HEART CATHETERIZATION WITH CORONARY ANGIOGRAM N/A 12/02/2014   Procedure: LEFT HEART CATHETERIZATION WITH CORONARY ANGIOGRAM;  Surgeon: Sinclair Grooms, MD;  Location: [facility] CATH LAB;  Service: Cardiovascular;  Laterality: N/A;  . ORIF HUMERUS FRACTURE Right 01/27/2014   Procedure: RIGHT OPEN REDUCTION INTERNAL FIXATION (ORIF) PROXIMAL HUMERUS FRACTURE;  Surgeon: Rozanna Box, MD;  Location: [facility];  Service: Orthopedics;  Laterality: Right;  . TOTAL KNEE ARTHROPLASTY Right 2006  . TOTAL KNEE ARTHROPLASTY Left 2011  . UMBILICAL HERNIA REPAIR     as a baby    Family History  Problem Relation Age of Onset  . CAD Mother   . Heart disease Mother   . Heart failure Mother   . Polycythemia Father   . Multiple myeloma Father   . Prostate cancer Paternal Grandfather   . Breast cancer Unknown   . Arthritis Maternal Aunt   . Breast cancer Maternal Aunt   . Stroke Sister   . Hyperlipidemia Sister   . Hypertension Sister   . Heart disease Brother        bradycardia  . Hypertension Brother     Social History   Socioeconomic History  . Marital status: Married    Spouse name: Not on file  . Number of children: Not on file  . Years of education: Not on file  . Highest education level: Not on file  Social Needs  . Financial resource strain: Not on file  .  Food insecurity - worry: Not on file  . Food insecurity - inability: Not on file  . Transportation needs - medical: Not on file  . Transportation needs - non-medical: Not on file  Occupational History  . Occupation: retired   Tobacco Use  . Smoking status: Never Smoker  . Smokeless tobacco: Never Used  Substance and Sexual Activity  . Alcohol use: No    Alcohol/week: 0.0 oz  . Drug use: No  . Sexual activity: Not on file  Other Topics Concern  . Not on file  Social History Narrative  . Not on file    Outpatient Medications Prior to Visit  Medication Sig Dispense Refill  . acetaminophen (TYLENOL) 500 MG tablet Take 1-2 tablets (500-1,000 mg total) by mouth every 6 (six) hours as needed  for moderate pain or fever. 90 tablet 0  . amoxicillin (AMOXIL) 500 MG tablet Take 4 tablets by mouth as needed. (Take one (1) hour prior to dental procedures)  3  . apixaban (ELIQUIS) 5 MG TABS tablet Take 1 tablet (5 mg total) by mouth 2 (two) times daily. 180 tablet 3  . B Complex Vitamins (B COMPLEX PO) Take 1 capsule by mouth daily.    . Calcium-Magnesium-Vitamin D (CALCIUM MAGNESIUM PO) Take 1 capsule by mouth daily.    . cetirizine (ZYRTEC) 10 MG tablet Take 10 mg by mouth at bedtime.    . Cholecalciferol (VITAMIN D) 2000 UNITS tablet Take 4,000 Units by mouth daily.    . Coenzyme Q10 200 MG TABS Take 200 mg by mouth daily.    Marland Kitchen levothyroxine (SYNTHROID, LEVOTHROID) 88 MCG tablet TAKE 1 TABLET (88 MCG TOTAL) BY MOUTH DAILY. 90 tablet 1  . metoprolol tartrate (LOPRESSOR) 25 MG tablet TAKE 1 TABLET (25 MG TOTAL) BY MOUTH 2 (TWO) TIMES DAILY. 180 tablet 3  . nitroGLYCERIN (NITROSTAT) 0.4 MG SL tablet PLACE 1 TABLET (0.4 MG TOTAL) UNDER THE TONGUE EVERY 5 (FIVE) MINUTES AS NEEDED FOR CHEST PAIN. 25 tablet 0  . LORazepam (ATIVAN) 0.5 MG tablet Take 1 tablet (0.5 mg total) by mouth at bedtime as needed for sleep. 30 tablet 1  . rosuvastatin (CRESTOR) 40 MG tablet Take 20 mg by mouth daily.     No facility-administered medications prior to visit.     Allergies  Allergen Reactions  . Keflex [Cephalexin] Rash    Review of Systems  Constitutional: Negative for fever and malaise/fatigue.  HENT: Negative for congestion.   Eyes: Negative for blurred vision.  Respiratory: Positive for cough. Negative for shortness of breath.   Cardiovascular: Negative for chest pain, palpitations and leg swelling.  Gastrointestinal: Negative for abdominal pain, blood in stool and nausea.  Genitourinary: Negative for dysuria and frequency.  Musculoskeletal: Positive for joint pain. Negative for falls.  Skin: Positive for itching and rash.  Neurological: Negative for dizziness, loss of consciousness and  headaches.  Endo/Heme/Allergies: Negative for environmental allergies.  Psychiatric/Behavioral: Negative for depression. The patient is not nervous/anxious.        Objective:    Physical Exam  Constitutional: She is oriented to person, place, and time. She appears well-developed and well-nourished. No distress.  HENT:  Head: Normocephalic and atraumatic.  Nose: Nose normal.  Eyes: Right eye exhibits no discharge. Left eye exhibits no discharge.  Neck: Normal range of motion. Neck supple.  Cardiovascular: Normal rate and regular rhythm.  No murmur heard. Pulmonary/Chest: Effort normal and breath sounds normal.  Abdominal: Soft. Bowel sounds are normal. There is no tenderness.  Musculoskeletal: She exhibits no edema.  Neurological: She is alert and oriented to person, place, and time.  Skin: Skin is warm and dry. Rash noted.  Erythematous, macerated macular lesions under b/l breasts R>L  Psychiatric: She has a normal mood and affect.  Nursing note and vitals reviewed.   BP 110/66 (BP Location: Left Arm, Patient Position: Sitting, Cuff Size: Large)   Pulse (!) 57   Temp 97.6 F (36.4 C) (Oral)   Resp 18   Wt 209 lb (94.8 kg)   SpO2 98%   BMI 37.62 kg/m  Wt Readings from Last 3 Encounters:  12/27/17 209 lb (94.8 kg)  06/26/17 207 lb 9.6 oz (94.2 kg)  05/21/17 207 lb 6.4 oz (94.1 kg)   BP Readings from Last 3 Encounters:  12/27/17 110/66  06/26/17 132/76  05/21/17 102/66     Immunization History  Administered Date(s) Administered  . Influenza, High Dose Seasonal PF 06/26/2017  . Pneumococcal Conjugate-13 05/23/2014  . Td 10/23/2009    Health Maintenance  Topic Date Due  . PNA vac Low Risk Adult (2 of 2 - PPSV23) 05/24/2015  . TETANUS/TDAP  10/24/2019  . INFLUENZA VACCINE  Completed  . DEXA SCAN  Completed    Lab Results  Component Value Date   WBC 5.6 12/27/2017   HGB 12.5 12/27/2017   HCT 37.4 12/27/2017   PLT 203.0 12/27/2017   GLUCOSE 101 (H)  12/27/2017   CHOL 181 12/27/2017   TRIG 126.0 12/27/2017   HDL 98.60 12/27/2017   LDLCALC 57 12/27/2017   ALT 14 12/27/2017   AST 20 12/27/2017   NA 137 12/27/2017   K 3.9 12/27/2017   CL 104 12/27/2017   CREATININE 1.13 12/27/2017   BUN 18 12/27/2017   CO2 24 12/27/2017   TSH 2.11 12/27/2017   INR 1.05 01/27/2014   HGBA1C 6.2 12/27/2017    Lab Results  Component Value Date   TSH 2.11 12/27/2017   Lab Results  Component Value Date   WBC 5.6 12/27/2017   HGB 12.5 12/27/2017   HCT 37.4 12/27/2017   MCV 95.3 12/27/2017   PLT 203.0 12/27/2017   Lab Results  Component Value Date   NA 137 12/27/2017   K 3.9 12/27/2017   CO2 24 12/27/2017   GLUCOSE 101 (H) 12/27/2017   BUN 18 12/27/2017   CREATININE 1.13 12/27/2017   BILITOT 0.7 12/27/2017   ALKPHOS 61 12/27/2017   AST 20 12/27/2017   ALT 14 12/27/2017   PROT 7.5 12/27/2017   ALBUMIN 4.3 12/27/2017   CALCIUM 9.7 12/27/2017   ANIONGAP 7 04/15/2015   GFR 49.73 (L) 12/27/2017   Lab Results  Component Value Date   CHOL 181 12/27/2017   Lab Results  Component Value Date   HDL 98.60 12/27/2017   Lab Results  Component Value Date   LDLCALC 57 12/27/2017   Lab Results  Component Value Date   TRIG 126.0 12/27/2017   Lab Results  Component Value Date   CHOLHDL 2 12/27/2017   Lab Results  Component Value Date   HGBA1C 6.2 12/27/2017         Assessment & Plan:   Problem List Items Addressed This Visit    Hypothyroidism    On Levothyroxine, continue to monitor      Hyperglycemia    hgba1c acceptable, minimize simple carbs. Increase exercise as tolerated. Continue current meds      Relevant Orders   Hemoglobin A1c (Completed)   HTN (hypertension)  Well controlled, no changes to meds. Encouraged heart healthy diet such as the DASH diet and exercise as tolerated.       Relevant Medications   rosuvastatin (CRESTOR) 40 MG tablet   Other Relevant Orders   Comprehensive metabolic panel  (Completed)   CBC (Completed)   TSH (Completed)   Osteoporosis    Vitamin D level check today      Relevant Orders   VITAMIN D 25 Hydroxy (Vit-D Deficiency, Fractures) (Completed)   Hyperlipidemia    Encouraged heart healthy diet, increase exercise, avoid trans fats, consider a krill oil cap daily      Relevant Medications   rosuvastatin (CRESTOR) 40 MG tablet   Other Relevant Orders   Lipid panel (Completed)   Rash, skin    Under breasts and panus, cleanse with mild soap, Witch Hazel Astringent and blow dry and then apply the Nystatin      Cold sore    Try Acyclovir ointment       Relevant Medications   nystatin cream (MYCOSTATIN)   acyclovir ointment (ZOVIRAX) 5 %    Other Visit Diagnoses    High risk medication use    -  Primary   Relevant Orders   Pain Mgmt, Profile 8 w/Conf, U      I have changed Beverely Risen. Salvi's rosuvastatin. I am also having her start on nystatin cream and acyclovir ointment. Additionally, I am having her maintain her cetirizine, Vitamin D, Calcium-Magnesium-Vitamin D (CALCIUM MAGNESIUM PO), acetaminophen, Coenzyme Q10, nitroGLYCERIN, metoprolol tartrate, amoxicillin, B Complex Vitamins (B COMPLEX PO), apixaban, and levothyroxine.  Meds ordered this encounter  Medications  . rosuvastatin (CRESTOR) 40 MG tablet    Sig: Take 0.5 tablets (20 mg total) by mouth daily.    Dispense:  90 tablet    Refill:  1  . nystatin cream (MYCOSTATIN)    Sig: Apply 1 application topically 2 (two) times daily as needed for dry skin (rash on abdomen).    Dispense:  30 g    Refill:  0  . acyclovir ointment (ZOVIRAX) 5 %    Sig: Apply small amount to affected 5 x daily x 4 days and as needed cold sore    Dispense:  30 g    Refill:  1    CMA served as scribe during this visit. History, Physical and Plan performed by medical provider. Documentation and orders reviewed and attested to.  Penni Homans, MD

## 2017-12-27 NOTE — Assessment & Plan Note (Signed)
Encouraged heart healthy diet, increase exercise, avoid trans fats, consider a krill oil cap daily 

## 2017-12-27 NOTE — Assessment & Plan Note (Signed)
hgba1c acceptable, minimize simple carbs. Increase exercise as tolerated. Continue current meds 

## 2017-12-27 NOTE — Telephone Encounter (Signed)
Copied from [place] [id number]. Topic: General - Other >> Dec 27, 2017  4:19 PM Cecelia Byars, NT wrote: Reason for CRM: Patient called and said she was supposed to get a prescription for LORazepam (ATIVAN) 0.5 MG tablet and also for Tramadol 50 mg sent to [id number] - [place], [place] - [address] [phone] (Phone)  [phone]  And she is also asking about a prescription Astelin  she is unsure if her insurance will pay for this  please call her  [phone]

## 2017-12-27 NOTE — Patient Instructions (Signed)
Cold Sore A cold sore, also called a fever blister, is a skin infection that causes small, fluid-filled sores to form inside of the mouth or on the lips, gums, nose, chin, or cheeks. Cold sores can spread to other parts of the body, such as the eyes or fingers. In some people with other medical conditions, cold sores can spread to multiple other body sites, including the genitals. Cold sores can be spread or passed from person to person (contagious) until the sores crust over completely. What are the causes? Cold sores are caused by the herpes simplex virus (HSV-1). HSV-1 is closely related to the virus that causes genital herpes (HSV-2), but these viruses are not the same. Once a person is infected with HSV-1, the virus remains permanently in the body. HSV-1 is spread from person to person through close contact, such as through kissing, touching the affected area, or sharing personal items such as lip balm, razors, or eating utensils. What increases the risk? A cold sore outbreak is more likely to develop in people who:  Are tired, stressed, or sick.  Are menstruating.  Are pregnant.  Take certain medicines.  Are exposed to cold weather or too much sun.  What are the signs or symptoms? Symptoms of a cold sore outbreak often go through different stages. Here is how a cold sore develops:  Tingling, itching, or burning is felt 1-2 days before the outbreak.  Fluid-filled blisters appear on the lips, inside the mouth, on the nose, or on the cheeks.  The blisters start to ooze clear fluid.  The blisters dry up and a yellow crust appears in its place.  The crust falls off.  Other symptoms include:  Fever.  Sore throat.  Headache.  Muscle aches.  Swollen neck glands.  You also may not have any symptoms. How is this diagnosed? This condition is often diagnosed based on your medical history and a physical exam. Your health care provider may swab your sore and then examine it in  the lab. Rarely, blood tests may be done to check for HSV-1. How is this treated? There is no cure for cold sores or HSV-1. There also is no vaccine for HSV-1. Most cold sores go away on their own without treatment within two weeks. Medicines cannot make the infection go away, but medicines can:  Help relieve some of the pain associated with the sores.  Work to stop the virus from multiplying.  Shorten healing time.  Medicines may be in the form of creams, gels, pills, or a shot. Follow these instructions at home: Medicines  Take or apply over-the-counter and prescription medicines only as told by your health care provider.  Use a cotton-tip swab to apply creams or gels to your sores. Sore Care  Do not touch the sores or pick the scabs.  Wash your hands often. Do not touch your eyes without washing your hands first.  Keep the sores clean and dry.  If directed, apply ice to the sores: ? Put ice in a plastic bag. ? Place a towel between your skin and the bag. ? Leave the ice on for 20 minutes, 2-3 times per day. Lifestyle  Do not kiss, have oral sex, or share personal items until your sores heal.  Eat a soft, bland diet. Avoid eating hot, cold, or salty foods. These can hurt your mouth.  Use a straw if it hurts to drink out of a glass.  Avoid the sun and limit your stress if these things   trigger outbreaks. If sun causes cold sores, apply sunscreen on your lips before being out in the sun. Contact a health care provider if:  You have symptoms for more than two weeks.  You have pus coming from the sores.  You have redness that is spreading.  You have pain or irritation in your eye.  You get sores on your genitals.  Your sores do not heal within two weeks.  You have frequent cold sore outbreaks. Get help right away if:  You have a fever and your symptoms suddenly get worse.  You have a headache and confusion. This information is not intended to replace advice  given to you by your health care provider. Make sure you discuss any questions you have with your health care provider. Document Released: 10/06/2000 Document Revised: 06/02/2016 Document Reviewed: 07/30/2015 Elsevier Interactive Patient Education  2018 [employer]  

## 2017-12-27 NOTE — Assessment & Plan Note (Signed)
Try Acyclovir ointment

## 2017-12-28 ENCOUNTER — Other Ambulatory Visit: Payer: Self-pay

## 2017-12-28 ENCOUNTER — Other Ambulatory Visit: Payer: Self-pay | Admitting: Family Medicine

## 2017-12-28 MED ORDER — TRAMADOL HCL 50 MG PO TABS: 50.0000 mg | ORAL_TABLET | Freq: Two times a day (BID) | ORAL | 0 refills | Status: DC | PRN

## 2017-12-28 MED ORDER — LORAZEPAM 0.5 MG PO TABS: 0.5000 mg | ORAL_TABLET | Freq: Every evening | ORAL | 1 refills | Status: DC | PRN

## 2017-12-28 NOTE — Telephone Encounter (Signed)
Refill sent to pcp for approval

## 2017-12-28 NOTE — Progress Notes (Signed)
I sent a refill on meds to the pharmacy

## 2017-12-31 LAB — PAIN MGMT, PROFILE 8 W/CONF, U
6 Acetylmorphine: NEGATIVE ng/mL (ref <10)
ALCOHOL METABOLITES: NEGATIVE ng/mL (ref <500)
ALPHAHYDROXYMIDAZOLAM: NEGATIVE ng/mL (ref <50)
Alphahydroxyalprazolam: NEGATIVE ng/mL (ref <25)
Alphahydroxytriazolam: NEGATIVE ng/mL (ref <50)
Aminoclonazepam: NEGATIVE ng/mL (ref <25)
Amphetamines: NEGATIVE ng/mL (ref <500)
Benzodiazepines: POSITIVE ng/mL — AB (ref <100)
Buprenorphine, Urine: NEGATIVE ng/mL (ref <5)
Cocaine Metabolite: NEGATIVE ng/mL (ref <150)
Creatinine: 53.2 mg/dL
Hydroxyethylflurazepam: NEGATIVE ng/mL (ref <50)
Lorazepam: 278 ng/mL — ABNORMAL HIGH (ref <50)
MARIJUANA METABOLITE: NEGATIVE ng/mL (ref <20)
MDMA: NEGATIVE ng/mL (ref <500)
Nordiazepam: NEGATIVE ng/mL (ref <50)
Opiates: NEGATIVE ng/mL (ref <100)
Oxazepam: NEGATIVE ng/mL (ref <50)
Oxidant: NEGATIVE ug/mL (ref <200)
Oxycodone: NEGATIVE ng/mL (ref <100)
Temazepam: NEGATIVE ng/mL (ref <50)
pH: 6.8 (ref 4.5–9.0)

## 2018-01-07 ENCOUNTER — Other Ambulatory Visit: Payer: Self-pay | Admitting: Interventional Cardiology

## 2018-01-07 DIAGNOSIS — I2510 Atherosclerotic heart disease of native coronary artery without angina pectoris: Secondary | ICD-10-CM

## 2018-01-07 DIAGNOSIS — I10 Essential (primary) hypertension: Secondary | ICD-10-CM

## 2018-01-07 DIAGNOSIS — I255 Ischemic cardiomyopathy: Secondary | ICD-10-CM

## 2018-01-07 DIAGNOSIS — R0683 Snoring: Secondary | ICD-10-CM

## 2018-01-07 DIAGNOSIS — I480 Paroxysmal atrial fibrillation: Secondary | ICD-10-CM

## 2018-02-11 ENCOUNTER — Encounter: Payer: Self-pay | Admitting: Cardiology

## 2018-02-20 NOTE — Progress Notes (Signed)
Cardiology Office Note:    Date:  02/21/2018   ID:  Wendy Munoz, DOB 05-Dec-1940, MRN [id number]  PCP:  Mosie Lukes, MD  Cardiologist:  Sinclair Grooms, MD   Referring MD: Mosie Lukes, MD   Chief Complaint  Patient presents with  . Coronary Artery Disease    History of Present Illness:    Wendy Munoz is a 77 y.o. female with a hx of CAD s/p inferior infarct 01/2015 with DES to proximal RCA, residual 60-75% proximal LAD stenosis, paroxysmal atrial fibrillation on eliquis, HTN, prediabetes, and chronic diastolic heart failure. He was last seen by Dr. Tamala Julian 03/20/17 after a sudden onset of weakness and near syncope on the day prior. This self-resolved before EMS arrived. He had a 48 hr monitor which ruled out significant arrhythmia. She also had a nuclear medicine stress test that was negative for reversible ischemia.   She returns to clinic for follow up today. Overall, she is doing quite well. She is fatigued and doesn't sleep well, but these have been problems for her for years. PCP is following thyroid function. Recent blood work negative for anemia. She has had two bouts of Afib: 04/2017 and 10/2017. We discussed her lopressor dose given her fatigued, but she will stay at the same dose as her Afib is well-controlled. She denies chest pain, shortness of breath, orthopnea, significant lower extremity edema, dizziness, and recent falls.   Past Medical History:  Diagnosis Date  . Allergic state 06/26/2017  . Anemia 05/21/2017  . Arthritis   . Atrial tachycardia ([place])    a. asymptomatic. Noted on tele during 11/2014 admission   . CAD (coronary artery disease)    a. 11/2014 inf STEMI s/p DES to RCA; 50-70% ruptured plaque in pLAD- rx medically  . GERD (gastroesophageal reflux disease)   . HTN (hypertension)   . Hyperglycemia   . Hyperlipidemia   . Hypothyroidism   . Insomnia 05/27/2017  . Ischemic cardiomyopathy    a. 11/2014  LV gram with inferior hypokinesis. EF 45-50%.  .  Nocturia 01/08/2017  . Obesity 06/26/2017  . Osteoporosis   . PAF (paroxysmal atrial fibrillation) ([place])    a. isolated episode of atrial fibrillation several years ago associated w/ her thyroid dysfunction (prior to ablation).   . Prediabetes   . Preventative health care 06/26/2017  . Vasomotor rhinitis 06/26/2017    Past Surgical History:  Procedure Laterality Date  . BREAST SURGERY     breast biospy  . CATARACT EXTRACTION Bilateral   . COLONOSCOPY    . EYE SURGERY Bilateral 2005, 2006   Cataract with lens ImplaNT   . INGUINAL HERNIA REPAIR Right   . LEFT HEART CATHETERIZATION WITH CORONARY ANGIOGRAM N/A 12/02/2014   Procedure: LEFT HEART CATHETERIZATION WITH CORONARY ANGIOGRAM;  Surgeon: Sinclair Grooms, MD;  Location: [facility] C[facility];  Service: Cardiovascular;  Laterality: N/A;  . ORIF HUMERUS FRACTURE Right 01/27/2014   Procedure: RIGHT OPEN REDUCTION INTERNAL FIXATION (ORIF) PROXIMAL HUMERUS FRACTURE;  Surgeon: Rozanna Box, MD;  Location: [facility];  Service: Orthopedics;  Laterality: Right;  . TOTAL KNEE ARTHROPLASTY Right 2006  . TOTAL KNEE ARTHROPLASTY Left 2011  . UMBILICAL HERNIA REPAIR     as a baby    Current Medications: Current Meds  Medication Sig  . acetaminophen (TYLENOL) 500 MG tablet Take 1-2 tablets (500-1,000 mg total) by mouth every 6 (six) hours as needed for moderate pain or fever.  Marland Kitchen acyclovir ointment (ZOVIRAX)  5 % Apply small amount to affected 5 x daily x 4 days and as needed cold sore  . amoxicillin (AMOXIL) 500 MG tablet Take 4 tablets by mouth as needed. (Take one (1) hour prior to dental procedures)  . apixaban (ELIQUIS) 5 MG TABS tablet Take 1 tablet (5 mg total) by mouth 2 (two) times daily.  . B Complex Vitamins (B COMPLEX PO) Take 1 capsule by mouth daily.  . Calcium-Magnesium-Vitamin D (CALCIUM MAGNESIUM PO) Take 1 capsule by mouth daily.  . cetirizine (ZYRTEC) 10 MG tablet Take 10 mg by mouth at bedtime.  . Cholecalciferol (VITAMIN D) 2000 UNITS  tablet Take 4,000 Units by mouth daily.  . Coenzyme Q10 200 MG TABS Take 200 mg by mouth daily.  . levothyroxine (SYNTHROID, LEVOTHROID) 88 MCG tablet TAKE 1 TABLET (88 MCG TOTAL) BY MOUTH DAILY.  . LORazepam (ATIVAN) 0.5 MG tablet Take 1 tablet (0.5 mg total) by mouth at bedtime as needed for sleep.  . metoprolol tartrate (LOPRESSOR) 25 MG tablet TAKE 1 TABLET (25 MG TOTAL) BY MOUTH 2 (TWO) TIMES DAILY.  . nitroGLYCERIN (NITROSTAT) 0.4 MG SL tablet PLACE 1 TABLET (0.4 MG TOTAL) UNDER THE TONGUE EVERY 5 (FIVE) MINUTES AS NEEDED FOR CHEST PAIN.  . nystatin cream (MYCOSTATIN) Apply 1 application topically 2 (two) times daily as needed for dry skin (rash on abdomen).  . rosuvastatin (CRESTOR) 40 MG tablet Take 0.5 tablets (20 mg total) by mouth daily.  . traMADol (ULTRAM) 50 MG tablet Take 1 tablet (50 mg total) by mouth every 12 (twelve) hours as needed. (PAIN)     Allergies:   Keflex [cephalexin]   Social History   Socioeconomic History  . Marital status: Married    Spouse name: Not on file  . Number of children: Not on file  . Years of education: Not on file  . Highest education level: Not on file  Occupational History  . Occupation: retired   Social Needs  . Financial resource strain: Not on file  . Food insecurity:    Worry: Not on file    Inability: Not on file  . Transportation needs:    Medical: Not on file    Non-medical: Not on file  Tobacco Use  . Smoking status: Never Smoker  . Smokeless tobacco: Never Used  Substance and Sexual Activity  . Alcohol use: No    Alcohol/week: 0.0 oz  . Drug use: No  . Sexual activity: Not on file  Lifestyle  . Physical activity:    Days per week: Not on file    Minutes per session: Not on file  . Stress: Not on file  Relationships  . Social connections:    Talks on phone: Not on file    Gets together: Not on file    Attends religious service: Not on file    Active member of club or organization: Not on file    Attends meetings  of clubs or organizations: Not on file    Relationship status: Not on file  Other Topics Concern  . Not on file  Social History Narrative  . Not on file     Family History: The patient's family history includes Arthritis in her maternal aunt; Breast cancer in her maternal aunt and unknown relative; CAD in her mother; Heart disease in her brother and mother; Heart failure in her mother; Hyperlipidemia in her sister; Hypertension in her brother and sister; Multiple myeloma in her father; Polycythemia in her father; Prostate cancer in   her paternal grandfather; Stroke in her sister.  ROS:   Please see the history of present illness.    All other systems reviewed and are negative.  EKGs/Labs/Other Studies Reviewed:    The following studies were reviewed today:  Myoview 04/24/17:  Nuclear stress EF: 50%.  There was no ST segment deviation noted during stress.  No T wave inversion was noted during stress.  This is a low risk study.   Holter monitor 04/03/17:  Normal sinus rhythm  PACs and PVCs  No correlation between premature beats and symptoms.  No sustained tachycardia or bradycardia arrhythmias.   Normal sinus rhythm with PACs and PVCs. No sustained arrhythmia.    EKG:  EKG is ordered today.  The ekg ordered today demonstrates sinus rhythm with HR 62 bpm  Recent Labs: 05/21/2017: Magnesium 2.2 12/27/2017: ALT 14; BUN 18; Creatinine, Ser 1.13; Hemoglobin 12.5; Platelets 203.0; Potassium 3.9; Sodium 137; TSH 2.11  Recent Lipid Panel    Component Value Date/Time   CHOL 181 12/27/2017 1408   TRIG 126.0 12/27/2017 1408   HDL 98.60 12/27/2017 1408   CHOLHDL 2 12/27/2017 1408   VLDL 25.2 12/27/2017 1408   LDLCALC 57 12/27/2017 1408    Physical Exam:    VS:  BP 128/68 (BP Location: Right Arm, Patient Position: Sitting, Cuff Size: Large)   Pulse 62   Resp (!) 97   Ht 5' 2.5" (1.588 m)   Wt 210 lb 12.8 oz (95.6 kg)   BMI 37.94 kg/m     Wt Readings from Last 3  Encounters:  02/21/18 210 lb 12.8 oz (95.6 kg)  12/27/17 209 lb (94.8 kg)  06/26/17 207 lb 9.6 oz (94.2 kg)     GEN: Well nourished, well developed in no acute distress HEENT: Normal NECK: No JVD; No carotid bruits LYMPHATICS: No lymphadenopathy CARDIAC: RRR, no murmurs, rubs, gallops RESPIRATORY:  Clear to auscultation without rales, wheezing or rhonchi  ABDOMEN: Soft, non-tender, non-distended MUSCULOSKELETAL:  Trace  edema; No deformity  SKIN: Warm and dry NEUROLOGIC:  Alert and oriented x 3 PSYCHIATRIC:  Normal affect   ASSESSMENT:    1. PAF (paroxysmal atrial fibrillation) (HCC)   2. Coronary artery disease involving native coronary artery of native heart without angina pectoris   3. Ischemic cardiomyopathy   4. Essential hypertension   5. Hyperlipidemia, unspecified hyperlipidemia type    PLAN:    In order of problems listed above:  PAF (paroxysmal atrial fibrillation) (HCC) Pt is controlled on lopressor and anticoagulated with eliquis.  This patients CHA2DS2-VASc Score and unadjusted Ischemic Stroke Rate (% per year) is equal to 9.7 % stroke rate/year from a score of 6 (CHF, HTN, CAD, age, female). No problems with bleeding. 2 episodes of Afib: 04/2017 and 10/2017. Spontaneously converted back to NSR, but she is very symptomatic during episodes of Afib.    Coronary artery disease involving native coronary artery of native heart without angina pectoris No chest pain. No ASA in the setting of eliquis.    Ischemic cardiomyopathy Pt is euvolemic on exam.    Essential hypertension Pressures are well-controlled. No medication changes.   Hyperlipidemia, unspecified hyperlipidemia type 12/27/2017: Cholesterol 181; HDL 98.60; LDL Cholesterol 57; Triglycerides 126.0; VLDL 25.2 Continue crestor 20 mg   Follow up with Dr.Smith in 1 year.   Medication Adjustments/Labs and Tests Ordered: Current medicines are reviewed at length with the patient today.  Concerns regarding  medicines are outlined above.  No orders of the defined types were placed in   this encounter.  No orders of the defined types were placed in this encounter.   Signed, Ledora Bottcher, PA  02/21/2018 2:04 PM    [facility]

## 2018-02-21 ENCOUNTER — Ambulatory Visit: Payer: PPO | Admitting: Physician Assistant

## 2018-02-21 ENCOUNTER — Encounter

## 2018-02-21 ENCOUNTER — Encounter: Payer: Self-pay | Admitting: Cardiology

## 2018-02-21 ENCOUNTER — Telehealth: Payer: Self-pay | Admitting: Interventional Cardiology

## 2018-02-21 VITALS — BP 128/68 | HR 62 | Resp 97 | Ht 62.5 in | Wt 210.8 lb

## 2018-02-21 DIAGNOSIS — I2510 Atherosclerotic heart disease of native coronary artery without angina pectoris: Secondary | ICD-10-CM

## 2018-02-21 DIAGNOSIS — R0683 Snoring: Secondary | ICD-10-CM

## 2018-02-21 DIAGNOSIS — I255 Ischemic cardiomyopathy: Secondary | ICD-10-CM | POA: Diagnosis not present

## 2018-02-21 DIAGNOSIS — I480 Paroxysmal atrial fibrillation: Secondary | ICD-10-CM | POA: Diagnosis not present

## 2018-02-21 DIAGNOSIS — I10 Essential (primary) hypertension: Secondary | ICD-10-CM

## 2018-02-21 DIAGNOSIS — E785 Hyperlipidemia, unspecified: Secondary | ICD-10-CM

## 2018-02-21 MED ORDER — ROSUVASTATIN CALCIUM 40 MG PO TABS: 20.0000 mg | ORAL_TABLET | Freq: Every day | ORAL | 3 refills | Status: DC

## 2018-02-21 MED ORDER — NITROGLYCERIN 0.4 MG SL SUBL: 0.4000 mg | SUBLINGUAL_TABLET | SUBLINGUAL | 6 refills | Status: DC | PRN

## 2018-02-21 MED ORDER — APIXABAN 5 MG PO TABS: 5.0000 mg | ORAL_TABLET | Freq: Two times a day (BID) | ORAL | 3 refills | Status: DC

## 2018-02-21 MED ORDER — METOPROLOL TARTRATE 25 MG PO TABS: ORAL_TABLET | ORAL | 3 refills | Status: DC

## 2018-02-21 NOTE — Telephone Encounter (Signed)
Pt was at her appt today requesting samples of Eliquis 5 mg tablets. I gave the pt 2 weeks supply and an application to fill out and return to [employer], LPN, coordinator for prior auth, before pt can get anymore samples.

## 2018-02-21 NOTE — Patient Instructions (Signed)
Medication Instructions:  1. REFILLS HAVE BEEN SENT IN FOR YOUR CARDIAC MEDICATIONS   2. Your physician recommends that you continue on your current medications as directed. Please refer to the Current Medication list given to you today.  3. YOU HAVE BEEN GIVEN SAMPLES OF ELIQUIS AS WELL AS PAPERWORK FOR THE ASSISTANCE PROGRAM FOR ELIQUIS; PLEASE FILL OUT THE PAPER WORK AND RETURN TO THE OFFICE SO THAT WE MAY SEE IF WE MAY BE ABLE TO GET YOUR MEDICATIONS AT NO COST OR FOR A LESSER COST.    Labwork: NONE ORDERED TODAY  Testing/Procedures: NONE ORDERED TODAY  Follow-Up: Your physician wants you to follow-up in: [place] Bidding will receive a reminder letter in the mail two months in advance. If you don't receive a letter, please call our office to schedule the follow-up appointment.   Any Other Special Instructions Will Be Listed Below (If Applicable).     If you need a refill on your cardiac medications before your next appointment, please call your pharmacy.

## 2018-03-28 IMAGING — NM NM MISC PROCEDURE
6 series · 36 of 36 positions shown · non-contrast
Comparison: none

[Series 1: rest · 6.40mm/px · 6 of 63 frames shown]
[frame 6/63]
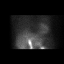
[frame 16/63]
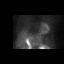
[frame 27/63]
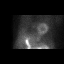
[frame 37/63]
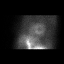
[frame 48/63]
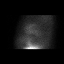
[frame 58/63]
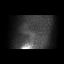

[Series 1: wbr_s-proj_st stress-sum-em · 6.40mm/px · 6 of 64 frames shown]
[frame 6/64]
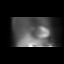
[frame 16/64]
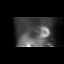
[frame 27/64]
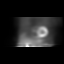
[frame 38/64]
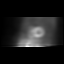
[frame 48/64]
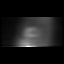
[frame 59/64]
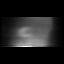

[Series 1: stress-sum-em · 6.40mm/px · 6 of 64 frames shown]
[frame 6/64]
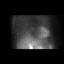
[frame 16/64]
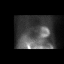
[frame 27/64]
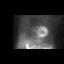
[frame 38/64]
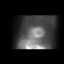
[frame 48/64]
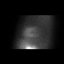
[frame 59/64]
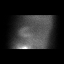

[Series 1: wbr_s-proj_st stress-gsp · 6.40mm/px · 6 of 512 frames shown]
[frame 43/512]
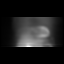
[frame 128/512]
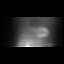
[frame 214/512]
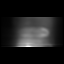
[frame 299/512]
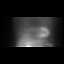
[frame 384/512]
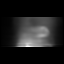
[frame 470/512]
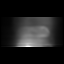

[Series 1: wbr_r-proj_st rest · 6.40mm/px · 6 of 64 frames shown]
[frame 6/64]
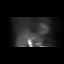
[frame 16/64]
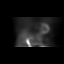
[frame 27/64]
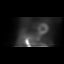
[frame 38/64]
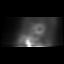
[frame 48/64]
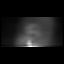
[frame 59/64]
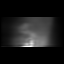

[Series 1: stress-gsp · 6.40mm/px · 6 of 509 frames shown]
[frame 43/509  full-range]
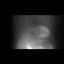
[frame 127/509  full-range]
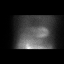
[frame 212/509  full-range]
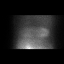
[frame 297/509  full-range]
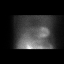
[frame 382/509  full-range]
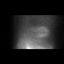
[frame 467/509  full-range]
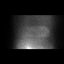

[36 of 36 positions shown; findings below may reference images not displayed]

Canned report from images found in remote index.

Refer to host system for actual result text.

## 2018-04-05 ENCOUNTER — Encounter: Payer: Self-pay | Admitting: Family Medicine

## 2018-04-05 ENCOUNTER — Other Ambulatory Visit: Payer: Self-pay | Admitting: Family Medicine

## 2018-04-05 MED ORDER — LEVOTHYROXINE SODIUM 88 MCG PO TABS
88.0000 ug | ORAL_TABLET | Freq: Every day | ORAL | 1 refills | Status: DC
Start: 2018-04-05 — End: 2018-11-05

## 2018-04-06 ENCOUNTER — Other Ambulatory Visit: Payer: Self-pay | Admitting: Family Medicine

## 2018-04-10 NOTE — Telephone Encounter (Signed)
Requesting: ATIVAN 0.5 MG Contract:10/31/17  UDS: 12/27/17 Last OV: 12/27/17 Next OV: 07/18/18 Last Refill: 01/07/18   Please advise

## 2018-06-14 ENCOUNTER — Telehealth: Payer: Self-pay | Admitting: Interventional Cardiology

## 2018-06-14 NOTE — Telephone Encounter (Signed)
Spoke with pt and she states that she picked up her Eliquis today and they made her aware that she is in the donut hole.  Pt still picked up 90 day prescription.  She inquired about cutting tabs in half or taking QD.  Advised this is not a therapeutic dose.  Advised I will send message to my pt assistance team to see if possibly pt could qualify for assistance to get through the donut hole.  Pt appreciative.

## 2018-06-14 NOTE — Telephone Encounter (Signed)
New Message:   Patient need to speak with some one about her eliquis

## 2018-06-14 NOTE — Telephone Encounter (Signed)
The pt states that when she went to pick up her Eliquis from her pharmacy today that it cost her almost $300 for a 90 day supply and she was advised that she is in the donut hole.  I offered her a pt assistance application and she stated that someone at this office gave her a [employer] pt asst application a while back.  I advised her to complete her pt assistance application, obtain her 2018 proof of income and her 2019 out of pocket expense report from her pharmacy and bring it all to me at least 2 weeks prior to her running out of the 90 day supply that she picked up today.  She verbalized understanding and thanked me for my help.

## 2018-06-20 DIAGNOSIS — Z96653 Presence of artificial knee joint, bilateral: Secondary | ICD-10-CM | POA: Diagnosis not present

## 2018-06-20 DIAGNOSIS — Z471 Aftercare following joint replacement surgery: Secondary | ICD-10-CM | POA: Diagnosis not present

## 2018-07-18 ENCOUNTER — Ambulatory Visit (INDEPENDENT_AMBULATORY_CARE_PROVIDER_SITE_OTHER): Payer: PPO | Admitting: Family Medicine

## 2018-07-18 VITALS — BP 122/60 | HR 58 | Temp 98.3°F | Resp 18 | Wt 209.8 lb

## 2018-07-18 DIAGNOSIS — M810 Age-related osteoporosis without current pathological fracture: Secondary | ICD-10-CM | POA: Diagnosis not present

## 2018-07-18 DIAGNOSIS — Z0000 Encounter for general adult medical examination without abnormal findings: Secondary | ICD-10-CM | POA: Diagnosis not present

## 2018-07-18 DIAGNOSIS — E039 Hypothyroidism, unspecified: Secondary | ICD-10-CM

## 2018-07-18 DIAGNOSIS — R739 Hyperglycemia, unspecified: Secondary | ICD-10-CM | POA: Diagnosis not present

## 2018-07-18 DIAGNOSIS — I10 Essential (primary) hypertension: Secondary | ICD-10-CM | POA: Diagnosis not present

## 2018-07-18 DIAGNOSIS — E785 Hyperlipidemia, unspecified: Secondary | ICD-10-CM

## 2018-07-18 DIAGNOSIS — Z23 Encounter for immunization: Secondary | ICD-10-CM | POA: Diagnosis not present

## 2018-07-18 NOTE — Assessment & Plan Note (Signed)
Denies CP/palp/SOB/HA/congestion/fevers/GI or GU c/o. Taking meds as prescribed 

## 2018-07-18 NOTE — Assessment & Plan Note (Signed)
On Levothyroxine, continue to monitor 

## 2018-07-18 NOTE — Assessment & Plan Note (Signed)
hgba1c acceptable, minimize simple carbs. Increase exercise as tolerated.  

## 2018-07-18 NOTE — Assessment & Plan Note (Signed)
Encouraged to get adequate exercise, calcium and vitamin d intake 

## 2018-07-18 NOTE — Patient Instructions (Addendum)
Lidocaine gel by [facility] for paIN  Shingirx is the new shingles shot, 2 shots over 2-6 months at pharmacy   Mammogram before the end of 2019 Preventive Care 65 Years and Older, Female Preventive care refers to lifestyle choices and visits with your health care provider that can promote health and wellness. What does preventive care include?  A yearly physical exam. This is also called an annual well check.  Dental exams once or twice a year.  Routine eye exams. Ask your health care provider how often you should have your eyes checked.  Personal lifestyle choices, including: ? Daily care of your teeth and gums. ? Regular physical activity. ? Eating a healthy diet. ? Avoiding tobacco and drug use. ? Limiting alcohol use. ? Practicing safe sex. ? Taking low-dose aspirin every day. ? Taking vitamin and mineral supplements as recommended by your health care provider. What happens during an annual well check? The services and screenings done by your health care provider during your annual well check will depend on your age, overall health, lifestyle risk factors, and family history of disease. Counseling Your health care provider may ask you questions about your:  Alcohol use.  Tobacco use.  Drug use.  Emotional well-being.  Home and relationship well-being.  Sexual activity.  Eating habits.  History of falls.  Memory and ability to understand (cognition).  Work and work environment.  Reproductive health.  Screening You may have the following tests or measurements:  Height, weight, and BMI.  Blood pressure.  Lipid and cholesterol levels. These may be checked every 5 years, or more frequently if you are over 50 years old.  Skin check.  Lung cancer screening. You may have this screening every year starting at age 55 if you have a 30-pack-year history of smoking and currently smoke or have quit within the past 15 years.  Fecal occult blood test (FOBT) of the  stool. You may have this test every year starting at age 50.  Flexible sigmoidoscopy or colonoscopy. You may have a sigmoidoscopy every 5 years or a colonoscopy every 10 years starting at age 50.  Hepatitis C blood test.  Hepatitis B blood test.  Sexually transmitted disease (STD) testing.  Diabetes screening. This is done by checking your blood sugar (glucose) after you have not eaten for a while (fasting). You may have this done every 1-3 years.  Bone density scan. This is done to screen for osteoporosis. You may have this done starting at age 77.  Mammogram. This may be done every 1-2 years. Talk to your health care provider about how often you should have regular mammograms.  Talk with your health care provider about your test results, treatment options, and if necessary, the need for more tests. Vaccines Your health care provider may recommend certain vaccines, such as:  Influenza vaccine. This is recommended every year.  Tetanus, diphtheria, and acellular pertussis (Tdap, Td) vaccine. You may need a Td booster every 10 years.  Varicella vaccine. You may need this if you have not been vaccinated.  Zoster vaccine. You may need this after age 60.  Measles, mumps, and rubella (MMR) vaccine. You may need at least one dose of MMR if you were born in 1957 or later. You may also need a second dose.  Pneumococcal 13-valent conjugate (PCV13) vaccine. One dose is recommended after age 77.  Pneumococcal polysaccharide (PPSV23) vaccine. One dose is recommended after age 77.  Meningococcal vaccine. You may need this if you have certain conditions.    Hepatitis A vaccine. You may need this if you have certain conditions or if you travel or work in places where you may be exposed to hepatitis A.  Hepatitis B vaccine. You may need this if you have certain conditions or if you travel or work in places where you may be exposed to hepatitis B.  Haemophilus influenzae type b (Hib) vaccine. You  may need this if you have certain conditions.  Talk to your health care provider about which screenings and vaccines you need and how often you need them. This information is not intended to replace advice given to you by your health care provider. Make sure you discuss any questions you have with your health care provider. Document Released: 11/05/2015 Document Revised: 06/28/2016 Document Reviewed: 08/10/2015 Elsevier Interactive Patient Education  2018 [employer]  

## 2018-07-19 LAB — LIPID PANEL
CHOLESTEROL: 171 mg/dL (ref 0–200)
HDL: 76 mg/dL (ref >39.00)
LDL Cholesterol: 74 mg/dL (ref 0–99)
NONHDL: 94.78
Total CHOL/HDL Ratio: 2
Triglycerides: 106 mg/dL (ref 0.0–149.0)
VLDL: 21.2 mg/dL (ref 0.0–40.0)

## 2018-07-19 LAB — COMPREHENSIVE METABOLIC PANEL
ALT: 15 U/L (ref 0–35)
AST: 22 U/L (ref 0–37)
Albumin: 4.2 g/dL (ref 3.5–5.2)
Alkaline Phosphatase: 56 U/L (ref 39–117)
BUN: 18 mg/dL (ref 6–23)
CALCIUM: 9.3 mg/dL (ref 8.4–10.5)
CO2: 27 meq/L (ref 19–32)
Chloride: 103 mEq/L (ref 96–112)
Creatinine, Ser: 1.17 mg/dL (ref 0.40–1.20)
GFR: 47.7 mL/min — AB (ref >60.00)
GLUCOSE: 97 mg/dL (ref 70–99)
Potassium: 4 mEq/L (ref 3.5–5.1)
Sodium: 137 mEq/L (ref 135–145)
Total Bilirubin: 0.8 mg/dL (ref 0.2–1.2)
Total Protein: 6.9 g/dL (ref 6.0–8.3)

## 2018-07-19 LAB — CBC
HCT: 34.6 % — ABNORMAL LOW (ref 36.0–46.0)
Hemoglobin: 11.6 g/dL — ABNORMAL LOW (ref 12.0–15.0)
MCHC: 33.5 g/dL (ref 30.0–36.0)
MCV: 94.2 fl (ref 78.0–100.0)
Platelets: 189 10*3/uL (ref 150.0–400.0)
RBC: 3.67 Mil/uL — AB (ref 3.87–5.11)
RDW: 13.8 % (ref 11.5–15.5)
WBC: 4.7 10*3/uL (ref 4.0–10.5)

## 2018-07-19 LAB — TSH: TSH: 2.19 u[IU]/mL (ref 0.35–4.50)

## 2018-07-19 LAB — HEMOGLOBIN A1C: HEMOGLOBIN A1C: 6.5 % (ref 4.6–6.5)

## 2018-07-21 NOTE — Progress Notes (Signed)
Subjective:    Patient ID: Wendy Munoz, female    DOB: [birth year], 77 y.o.   MRN: [id number]  No chief complaint on file.   HPI Patient is in today for annual preventative exam and follow up on her chronic medical concerns including hyperlipidemia, hyperglycemia, chronic shoulder pain and more. Her right shoulder continues to bother her and decrease her range of motion and it is bad enough she is ready for a referral to orthopaedics to discuss her options. No recent fall or trauma. She had a fracture in 2015 requiring surgery and it has been limited since then. She is doing with ADLs otherwise. No recent febrile illness or hospitalizations. Is trying to maintain a heart healthy diet and stay active. Denies CP/palp/SOB/HA/congestion/fevers/GI or GU c/o. Taking meds as prescribed Past Medical History:  Diagnosis Date  . Allergic state 06/26/2017  . Anemia 05/21/2017  . Arthritis   . Atrial tachycardia ([place])    a. asymptomatic. Noted on tele during 11/2014 admission   . CAD (coronary artery disease)    a. 11/2014 inf STEMI s/p DES to RCA; 50-70% ruptured plaque in pLAD- rx medically  . GERD (gastroesophageal reflux disease)   . HTN (hypertension)   . Hyperglycemia   . Hyperlipidemia   . Hypothyroidism   . Insomnia 05/27/2017  . Ischemic cardiomyopathy    a. 11/2014  LV gram with inferior hypokinesis. EF 45-50%.  . Nocturia 01/08/2017  . Obesity 06/26/2017  . Osteoporosis   . PAF (paroxysmal atrial fibrillation) ([place])    a. isolated episode of atrial fibrillation several years ago associated w/ her thyroid dysfunction (prior to ablation).   . Prediabetes   . Preventative health care 06/26/2017  . Vasomotor rhinitis 06/26/2017    Past Surgical History:  Procedure Laterality Date  . BREAST SURGERY     breast biospy  . CATARACT EXTRACTION Bilateral   . COLONOSCOPY    . EYE SURGERY Bilateral 2005, 2006   Cataract with lens ImplaNT   . INGUINAL HERNIA REPAIR Right   . LEFT HEART  CATHETERIZATION WITH CORONARY ANGIOGRAM N/A 12/02/2014   Procedure: LEFT HEART CATHETERIZATION WITH CORONARY ANGIOGRAM;  Surgeon: Sinclair Grooms, MD;  Location[facility] C[facility] LAB;  Service: Cardiovascular;  Laterality: N/A;  . ORIF HUMERUS FRACTURE Right 01/27/2014   Procedure: RIGHT OPEN REDUCTION INTERNAL FIXATION (ORIF) PROXIMAL HUMERUS FRACTURE;  Surgeon: Rozanna Box, MD;  Location: [place];  Service: Orthopedics;  Laterality: Right;  . TOTAL KNEE ARTHROPLASTY Right 2006  . TOTAL KNEE ARTHROPLASTY Left 2011  . UMBILICAL HERNIA REPAIR     as a baby    Family History  Problem Relation Age of Onset  . CAD Mother   . Heart disease Mother   . Heart failure Mother   . Polycythemia Father   . Multiple myeloma Father   . Prostate cancer Paternal Grandfather   . Breast cancer Unknown   . Arthritis Maternal Aunt   . Breast cancer Maternal Aunt   . Stroke Sister   . Hyperlipidemia Sister   . Hypertension Sister   . Heart disease Brother        bradycardia  . Hypertension Brother     Social History   Socioeconomic History  . Marital status: Married    Spouse name: Not on file  . Number of children: Not on file  . Years of education: Not on file  . Highest education level: Not on file  Occupational History  . Occupation: retired  Social Needs  . Financial resource strain: Not on file  . Food insecurity:    Worry: Not on file    Inability: Not on file  . Transportation needs:    Medical: Not on file    Non-medical: Not on file  Tobacco Use  . Smoking status: Never Smoker  . Smokeless tobacco: Never Used  Substance and Sexual Activity  . Alcohol use: No    Alcohol/week: 0.0 standard drinks  . Drug use: No  . Sexual activity: Not on file  Lifestyle  . Physical activity:    Days per week: Not on file    Minutes per session: Not on file  . Stress: Not on file  Relationships  . Social connections:    Talks on phone: Not on file    Gets together: Not on file    Attends  religious service: Not on file    Active member of club or organization: Not on file    Attends meetings of clubs or organizations: Not on file    Relationship status: Not on file  . Intimate partner violence:    Fear of current or ex partner: Not on file    Emotionally abused: Not on file    Physically abused: Not on file    Forced sexual activity: Not on file  Other Topics Concern  . Not on file  Social History Narrative  . Not on file    Outpatient Medications Prior to Visit  Medication Sig Dispense Refill  . acetaminophen (TYLENOL) 500 MG tablet Take 1-2 tablets (500-1,000 mg total) by mouth every 6 (six) hours as needed for moderate pain or fever. 90 tablet 0  . acyclovir ointment (ZOVIRAX) 5 % Apply small amount to affected 5 x daily x 4 days and as needed cold sore 30 g 1  . amoxicillin (AMOXIL) 500 MG tablet Take 4 tablets by mouth as needed. (Take one (1) hour prior to dental procedures)  3  . apixaban (ELIQUIS) 5 MG TABS tablet Take 1 tablet (5 mg total) by mouth 2 (two) times daily. 180 tablet 3  . B Complex Vitamins (B COMPLEX PO) Take 1 capsule by mouth daily.    . Calcium-Magnesium-Vitamin D (CALCIUM MAGNESIUM PO) Take 1 capsule by mouth daily.    . cetirizine (ZYRTEC) 10 MG tablet Take 10 mg by mouth at bedtime.    . Cholecalciferol (VITAMIN D) 2000 UNITS tablet Take 4,000 Units by mouth daily.    . Coenzyme Q10 200 MG TABS Take 200 mg by mouth daily.    Marland Kitchen levothyroxine (SYNTHROID, LEVOTHROID) 88 MCG tablet Take 1 tablet (88 mcg total) by mouth daily. 90 tablet 1  . LORazepam (ATIVAN) 0.5 MG tablet TAKE 1 TABLET (0.5 MG TOTAL) BY MOUTH AT BEDTIME AS NEEDED FOR SLEEP. 30 tablet 1  . metoprolol tartrate (LOPRESSOR) 25 MG tablet TAKE 1 TABLET (25 MG TOTAL) BY MOUTH 2 (TWO) TIMES DAILY. 180 tablet 3  . nitroGLYCERIN (NITROSTAT) 0.4 MG SL tablet Place 1 tablet (0.4 mg total) under the tongue every 5 (five) minutes as needed for chest pain. 25 tablet 6  . nystatin cream  (MYCOSTATIN) Apply 1 application topically 2 (two) times daily as needed for dry skin (rash on abdomen). 30 g 0  . rosuvastatin (CRESTOR) 40 MG tablet Take 0.5 tablets (20 mg total) by mouth daily. 90 tablet 3  . traMADol (ULTRAM) 50 MG tablet Take 1 tablet (50 mg total) by mouth every 12 (twelve) hours as needed. (PAIN)  30 tablet 0   No facility-administered medications prior to visit.     Allergies  Allergen Reactions  . Keflex [Cephalexin] Rash    Review of Systems  Constitutional: Negative for chills, fever and malaise/fatigue.  HENT: Negative for congestion and hearing loss.   Eyes: Negative for discharge.  Respiratory: Negative for cough, sputum production and shortness of breath.   Cardiovascular: Negative for chest pain, palpitations and leg swelling.  Gastrointestinal: Negative for abdominal pain, blood in stool, constipation, diarrhea, heartburn, nausea and vomiting.  Genitourinary: Negative for dysuria, frequency, hematuria and urgency.  Musculoskeletal: Positive for joint pain. Negative for back pain, falls and myalgias.  Skin: Negative for rash.  Neurological: Negative for dizziness, sensory change, loss of consciousness, weakness and headaches.  Endo/Heme/Allergies: Negative for environmental allergies. Does not bruise/bleed easily.  Psychiatric/Behavioral: Negative for depression and suicidal ideas. The patient is not nervous/anxious and does not have insomnia.        Objective:    Physical Exam  Constitutional: She is oriented to person, place, and time. She appears well-developed and well-nourished. No distress.  HENT:  Head: Normocephalic and atraumatic.  Eyes: Conjunctivae are normal.  Neck: Neck supple. No thyromegaly present.  Cardiovascular: Normal rate and regular rhythm.  Pulmonary/Chest: Effort normal and breath sounds normal. No respiratory distress.  Abdominal: Soft. Bowel sounds are normal. She exhibits no distension and no mass. There is no  tenderness.  Musculoskeletal: She exhibits no edema.  Lymphadenopathy:    She has no cervical adenopathy.  Neurological: She is alert and oriented to person, place, and time.  Skin: Skin is warm and dry.  Psychiatric: She has a normal mood and affect. Her behavior is normal.    BP 122/60 (BP Location: Left Arm, Patient Position: Sitting, Cuff Size: Normal)   Pulse (!) 58   Temp 98.3 F (36.8 C) (Oral)   Resp 18   Wt 209 lb 12.8 oz (95.2 kg)   SpO2 99%   BMI 37.76 kg/m  Wt Readings from Last 3 Encounters:  07/18/18 209 lb 12.8 oz (95.2 kg)  02/21/18 210 lb 12.8 oz (95.6 kg)  12/27/17 209 lb (94.8 kg)     Lab Results  Component Value Date   WBC 4.7 07/18/2018   HGB 11.6 (L) 07/18/2018   HCT 34.6 (L) 07/18/2018   PLT 189.0 07/18/2018   GLUCOSE 97 07/18/2018   CHOL 171 07/18/2018   TRIG 106.0 07/18/2018   HDL 76.00 07/18/2018   LDLCALC 74 07/18/2018   ALT 15 07/18/2018   AST 22 07/18/2018   NA 137 07/18/2018   K 4.0 07/18/2018   CL 103 07/18/2018   CREATININE 1.17 07/18/2018   BUN 18 07/18/2018   CO2 27 07/18/2018   TSH 2.19 07/18/2018   INR 1.05 01/27/2014   HGBA1C 6.5 07/18/2018    Lab Results  Component Value Date   TSH 2.19 07/18/2018   Lab Results  Component Value Date   WBC 4.7 07/18/2018   HGB 11.6 (L) 07/18/2018   HCT 34.6 (L) 07/18/2018   MCV 94.2 07/18/2018   PLT 189.0 07/18/2018   Lab Results  Component Value Date   NA 137 07/18/2018   K 4.0 07/18/2018   CO2 27 07/18/2018   GLUCOSE 97 07/18/2018   BUN 18 07/18/2018   CREATININE 1.17 07/18/2018   BILITOT 0.8 07/18/2018   ALKPHOS 56 07/18/2018   AST 22 07/18/2018   ALT 15 07/18/2018   PROT 6.9 07/18/2018   ALBUMIN 4.2 07/18/2018  CALCIUM 9.3 07/18/2018   ANIONGAP 7 04/15/2015   GFR 47.70 (L) 07/18/2018   Lab Results  Component Value Date   CHOL 171 07/18/2018   Lab Results  Component Value Date   HDL 76.00 07/18/2018   Lab Results  Component Value Date   LDLCALC 74  07/18/2018   Lab Results  Component Value Date   TRIG 106.0 07/18/2018   Lab Results  Component Value Date   CHOLHDL 2 07/18/2018   Lab Results  Component Value Date   HGBA1C 6.5 07/18/2018       Assessment & Plan:   Problem List Items Addressed This Visit    Hypothyroidism    On Levothyroxine, continue to monitor      Hyperglycemia    hgba1c acceptable, minimize simple carbs. Increase exercise as tolerated      Relevant Orders   Hemoglobin A1c (Completed)   HTN (hypertension)    Denies CP/palp/SOB/HA/congestion/fevers/GI or GU c/o. Taking meds as prescribed      Relevant Orders   CBC (Completed)   Comprehensive metabolic panel (Completed)   TSH (Completed)   Osteoporosis    Encouraged to get adequate exercise, calcium and vitamin d intake      Hyperlipidemia - Primary   Relevant Orders   Lipid panel (Completed)   Preventative health care    Patient encouraged to maintain heart healthy diet, regular exercise, adequate sleep. Consider daily probiotics. Take medications as prescribed. Given and reviewed copy of ACP documents from [employer] and encouraged to complete and return         I am having Jozlynn Plaia. [place] maintain her cetirizine, Vitamin D, Calcium-Magnesium-Vitamin D (CALCIUM MAGNESIUM PO), acetaminophen, Coenzyme Q10, amoxicillin, B Complex Vitamins (B COMPLEX PO), nystatin cream, acyclovir ointment, traMADol, apixaban, metoprolol tartrate, nitroGLYCERIN, rosuvastatin, levothyroxine, and LORazepam.  No orders of the defined types were placed in this encounter.    Penni Homans, MD

## 2018-07-21 NOTE — Assessment & Plan Note (Signed)
Patient encouraged to maintain heart healthy diet, regular exercise, adequate sleep. Consider daily probiotics. Take medications as prescribed. Given and reviewed copy of ACP documents from [place] Secretary of State and encouraged to complete and return 

## 2018-07-22 ENCOUNTER — Telehealth: Payer: Self-pay | Admitting: *Deleted

## 2018-07-22 NOTE — Telephone Encounter (Signed)
Received Medical records from [facility]; forwarded to provider/SLS 09/30

## 2018-07-26 ENCOUNTER — Encounter: Payer: Self-pay | Admitting: Family Medicine

## 2018-09-03 ENCOUNTER — Telehealth: Payer: Self-pay | Admitting: Family Medicine

## 2018-09-03 NOTE — Telephone Encounter (Signed)
Spoke with Wendy Munoz regarding AWV. Patient declined to schedule appointment. SF

## 2018-09-20 ENCOUNTER — Ambulatory Visit (INDEPENDENT_AMBULATORY_CARE_PROVIDER_SITE_OTHER): Payer: PPO | Admitting: Internal Medicine

## 2018-09-20 ENCOUNTER — Encounter: Payer: Self-pay | Admitting: Internal Medicine

## 2018-09-20 VITALS — BP 120/62 | HR 92 | Temp 98.2°F | Resp 16 | Wt 208.0 lb

## 2018-09-20 DIAGNOSIS — J209 Acute bronchitis, unspecified: Secondary | ICD-10-CM | POA: Diagnosis not present

## 2018-09-20 DIAGNOSIS — R062 Wheezing: Secondary | ICD-10-CM | POA: Diagnosis not present

## 2018-09-20 MED ORDER — METHYLPREDNISOLONE ACETATE 80 MG/ML IJ SUSP
80.0000 mg | Freq: Once | INTRAMUSCULAR | Status: AC
  Administered 2018-09-20: 80 mg via INTRAMUSCULAR

## 2018-09-20 MED ORDER — DOXYCYCLINE HYCLATE 100 MG PO TABS: 100.0000 mg | ORAL_TABLET | Freq: Two times a day (BID) | ORAL | 0 refills | Status: DC

## 2018-09-20 NOTE — Assessment & Plan Note (Signed)
Diffuse wheezing on exam-likely reactive to acute bronchitis Depo-Medrol 80 mg IM x1 She would like to avoid oral steroids Doxycycline prescribed for bronchitis Discussed over-the-counter cold medication/allergy medications Call if no improvement

## 2018-09-20 NOTE — Patient Instructions (Signed)
You had a steroid injection today.   Medications reviewed and updated.  Changes include :   Start doxycyline (antibiotic).    Take over the counter cold medications and cough suppressants like the tessalon Perles.    Call if no improvement

## 2018-09-20 NOTE — Progress Notes (Signed)
Subjective:    Patient ID: Wendy Munoz, female    DOB: [birth year], 77 y.o.   MRN: [id number]  HPI She is here for an acute visit for cold symptoms.  Her symptoms started one week ago.    She is experiencing nasal congestion, rhinorrhea, sore throat, productive cough that is wet sounding, wheezing and pain in her sides from coughing.  She denies any fever, chills, ear pain, sinus pain, shortness of breath, GI symptoms and headaches.  She has taken chloraseptic spray, mucinex and cough drops.    Medications and allergies reviewed with patient and updated if appropriate.  Patient Active Problem List   Diagnosis Date Noted  . Rash, skin 12/27/2017  . Cold sore 12/27/2017  . Vasomotor rhinitis 06/26/2017  . Preventative health care 06/26/2017  . Allergic state 06/26/2017  . Obesity 06/26/2017  . Insomnia 05/27/2017  . Urinary frequency 05/27/2017  . Hyperlipidemia 05/27/2017  . Anemia 05/21/2017  . Hematuria 02/18/2017  . Nocturia 01/08/2017  . CAD (coronary artery disease)   . PAF (paroxysmal atrial fibrillation) ([place])   . Ischemic cardiomyopathy   . Atrial tachycardia ([place])   . Hypothyroidism   . Hyperglycemia   . HTN (hypertension)   . Osteoporosis   . GERD (gastroesophageal reflux disease)     Current Outpatient Medications on File Prior to Visit  Medication Sig Dispense Refill  . acetaminophen (TYLENOL) 500 MG tablet Take 1-2 tablets (500-1,000 mg total) by mouth every 6 (six) hours as needed for moderate pain or fever. 90 tablet 0  . acyclovir ointment (ZOVIRAX) 5 % Apply small amount to affected 5 x daily x 4 days and as needed cold sore 30 g 1  . amoxicillin (AMOXIL) 500 MG tablet Take 4 tablets by mouth as needed. (Take one (1) hour prior to dental procedures)  3  . apixaban (ELIQUIS) 5 MG TABS tablet Take 1 tablet (5 mg total) by mouth 2 (two) times daily. 180 tablet 3  . B Complex Vitamins (B COMPLEX PO) Take 1 capsule by mouth daily.    .  Calcium-Magnesium-Vitamin D (CALCIUM MAGNESIUM PO) Take 1 capsule by mouth daily.    . cetirizine (ZYRTEC) 10 MG tablet Take 10 mg by mouth at bedtime.    . Cholecalciferol (VITAMIN D) 2000 UNITS tablet Take 4,000 Units by mouth daily.    . Coenzyme Q10 200 MG TABS Take 200 mg by mouth daily.    Marland Kitchen levothyroxine (SYNTHROID, LEVOTHROID) 88 MCG tablet Take 1 tablet (88 mcg total) by mouth daily. 90 tablet 1  . metoprolol tartrate (LOPRESSOR) 25 MG tablet TAKE 1 TABLET (25 MG TOTAL) BY MOUTH 2 (TWO) TIMES DAILY. 180 tablet 3  . nitroGLYCERIN (NITROSTAT) 0.4 MG SL tablet Place 1 tablet (0.4 mg total) under the tongue every 5 (five) minutes as needed for chest pain. 25 tablet 6  . nystatin cream (MYCOSTATIN) Apply 1 application topically 2 (two) times daily as needed for dry skin (rash on abdomen). 30 g 0  . rosuvastatin (CRESTOR) 40 MG tablet Take 0.5 tablets (20 mg total) by mouth daily. 90 tablet 3  . traMADol (ULTRAM) 50 MG tablet Take 1 tablet (50 mg total) by mouth every 12 (twelve) hours as needed. (PAIN) (Patient not taking: Reported on 09/20/2018) 30 tablet 0   No current facility-administered medications on file prior to visit.     Past Medical History:  Diagnosis Date  . Allergic state 06/26/2017  . Anemia 05/21/2017  . Arthritis   .  Atrial tachycardia ([place])    a. asymptomatic. Noted on tele during 11/2014 admission   . CAD (coronary artery disease)    a. 11/2014 inf STEMI s/p DES to RCA; 50-70% ruptured plaque in pLAD- rx medically  . GERD (gastroesophageal reflux disease)   . HTN (hypertension)   . Hyperglycemia   . Hyperlipidemia   . Hypothyroidism   . Insomnia 05/27/2017  . Ischemic cardiomyopathy    a. 11/2014  LV gram with inferior hypokinesis. EF 45-50%.  . Nocturia 01/08/2017  . Obesity 06/26/2017  . Osteoporosis   . PAF (paroxysmal atrial fibrillation) ([place])    a. isolated episode of atrial fibrillation several years ago associated w/ her thyroid dysfunction (prior to ablation).    . Prediabetes   . Preventative health care 06/26/2017  . Vasomotor rhinitis 06/26/2017    Past Surgical History:  Procedure Laterality Date  . BREAST SURGERY     breast biospy  . CATARACT EXTRACTION Bilateral   . COLONOSCOPY    . EYE SURGERY Bilateral 2005, 2006   Cataract with lens ImplaNT   . INGUINAL HERNIA REPAIR Right   . LEFT HEART CATHETERIZATION WITH CORONARY ANGIOGRAM N/A 12/02/2014   Procedure: LEFT HEART CATHETERIZATION WITH CORONARY ANGIOGRAM;  Surgeon: Sinclair Grooms, MD;  Location: [facility] CATH LAB;  Service: Cardiovascular;  Laterality: N/A;  . ORIF HUMERUS FRACTURE Right 01/27/2014   Procedure: RIGHT OPEN REDUCTION INTERNAL FIXATION (ORIF) PROXIMAL HUMERUS FRACTURE;  Surgeon: Rozanna Box, MD;  Location: [place];  Service: Orthopedics;  Laterality: Right;  . TOTAL KNEE ARTHROPLASTY Right 2006  . TOTAL KNEE ARTHROPLASTY Left 2011  . UMBILICAL HERNIA REPAIR     as a baby    Social History   Socioeconomic History  . Marital status: Married    Spouse name: Not on file  . Number of children: Not on file  . Years of education: Not on file  . Highest education level: Not on file  Occupational History  . Occupation: retired   Scientific laboratory technician  . Financial resource strain: Not on file  . Food insecurity:    Worry: Not on file    Inability: Not on file  . Transportation needs:    Medical: Not on file    Non-medical: Not on file  Tobacco Use  . Smoking status: Never Smoker  . Smokeless tobacco: Never Used  Substance and Sexual Activity  . Alcohol use: No    Alcohol/week: 0.0 standard drinks  . Drug use: No  . Sexual activity: Not on file  Lifestyle  . Physical activity:    Days per week: Not on file    Minutes per session: Not on file  . Stress: Not on file  Relationships  . Social connections:    Talks on phone: Not on file    Gets together: Not on file    Attends religious service: Not on file    Active member of club or organization: Not on file    Attends  meetings of clubs or organizations: Not on file    Relationship status: Not on file  Other Topics Concern  . Not on file  Social History Narrative  . Not on file    Family History  Problem Relation Age of Onset  . CAD Mother   . Heart disease Mother   . Heart failure Mother   . Polycythemia Father   . Multiple myeloma Father   . Prostate cancer Paternal Grandfather   . Breast cancer Unknown   .  Arthritis Maternal Aunt   . Breast cancer Maternal Aunt   . Stroke Sister   . Hyperlipidemia Sister   . Hypertension Sister   . Heart disease Brother        bradycardia  . Hypertension Brother     Review of Systems  Constitutional: Negative for chills and fever.  HENT: Positive for congestion, rhinorrhea and sore throat. Negative for ear pain and sinus pain.   Respiratory: Positive for cough (dry, wet sounding) and wheezing. Negative for shortness of breath.   Gastrointestinal: Negative for diarrhea and nausea.  Musculoskeletal: Positive for myalgias (sides sore from coughing).  Neurological: Negative for dizziness, light-headedness and headaches.       Objective:   Vitals:   09/20/18 0933  BP: 120/62  Pulse: 92  Resp: 16  Temp: 98.2 F (36.8 C)  SpO2: 96%   Filed Weights   09/20/18 0933  Weight: 208 lb (94.3 kg)   Body mass index is 37.44 kg/m.  Wt Readings from Last 3 Encounters:  09/20/18 208 lb (94.3 kg)  07/18/18 209 lb 12.8 oz (95.2 kg)  02/21/18 210 lb 12.8 oz (95.6 kg)     Physical Exam GENERAL APPEARANCE: Appears stated age, well appearing, NAD EYES: conjunctiva clear, no icterus HEENT: bilateral tympanic membranes and ear canals normal, oropharynx with mild erythema, no thyromegaly, trachea midline, no cervical or supraclavicular lymphadenopathy LUNGS: unlabored breathing, good air entry bilaterally, diffuse expiratory wheeze in all lung fields, no obvious crackles CARDIOVASCULAR: Normal S1,S2 without murmurs, no edema SKIN: warm, dry          Assessment & Plan:   See Problem List for Assessment and Plan of chronic medical problems.

## 2018-09-20 NOTE — Assessment & Plan Note (Signed)
Likely bacterial  Start doxycycline otc cold medications Rest, fluid Call if no improvement  

## 2018-09-23 ENCOUNTER — Encounter: Payer: Self-pay | Admitting: Family Medicine

## 2018-09-23 ENCOUNTER — Ambulatory Visit (INDEPENDENT_AMBULATORY_CARE_PROVIDER_SITE_OTHER): Payer: PPO | Admitting: Family Medicine

## 2018-09-23 ENCOUNTER — Ambulatory Visit (HOSPITAL_BASED_OUTPATIENT_CLINIC_OR_DEPARTMENT_OTHER)
Admission: RE | Admit: 2018-09-23 | Discharge: 2018-09-23 | Disposition: A | Discharge status: Home / Self Care | Payer: PPO | Source: Ambulatory Visit | Attending: Family Medicine | Admitting: Family Medicine

## 2018-09-23 VITALS — BP 134/48 | HR 62 | Temp 97.8°F | Resp 16 | Ht 62.0 in | Wt 209.4 lb

## 2018-09-23 DIAGNOSIS — J440 Chronic obstructive pulmonary disease with acute lower respiratory infection: Secondary | ICD-10-CM

## 2018-09-23 DIAGNOSIS — R05 Cough: Secondary | ICD-10-CM | POA: Insufficient documentation

## 2018-09-23 DIAGNOSIS — R059 Cough, unspecified: Secondary | ICD-10-CM

## 2018-09-23 DIAGNOSIS — J209 Acute bronchitis, unspecified: Secondary | ICD-10-CM

## 2018-09-23 MED ORDER — PREDNISONE 10 MG PO TABS: ORAL_TABLET | ORAL | 0 refills | Status: DC

## 2018-09-23 MED ORDER — LEVOFLOXACIN 500 MG PO TABS: 500.0000 mg | ORAL_TABLET | Freq: Every day | ORAL | 0 refills | Status: DC

## 2018-09-23 NOTE — Progress Notes (Signed)
Patient ID: Wendy Munoz, female    DOB: December 27, 1940  Age: 77 y.o. MRN: [id number]    Subjective:  Subjective  HPI Wendy Munoz presents for f/u bronchitis that started 11/24-- with cough, runny and sore throat.  She had tessalon perles with no relief.  + cough is productive-- green mucous    She was given doxy and depomedrol shot --- she is feeling a little better.  Her back hurts from coughing.    Review of Systems  Constitutional: Positive for chills. Negative for appetite change, diaphoresis, fatigue, fever and unexpected weight change.  HENT: Positive for congestion, postnasal drip, rhinorrhea and sinus pressure.   Eyes: Negative for pain, redness and visual disturbance.  Respiratory: Positive for wheezing. Negative for cough, chest tightness and shortness of breath.   Cardiovascular: Negative for chest pain, palpitations and leg swelling.  Endocrine: Negative for cold intolerance, heat intolerance, polydipsia, polyphagia and polyuria.  Genitourinary: Negative for difficulty urinating, dysuria and frequency.  Allergic/Immunologic: Negative for environmental allergies.  Neurological: Negative for dizziness, light-headedness, numbness and headaches.    History Past Medical History:  Diagnosis Date  . Allergic state 06/26/2017  . Anemia 05/21/2017  . Arthritis   . Atrial tachycardia ([place])    a. asymptomatic. Noted on tele during 11/2014 admission   . CAD (coronary artery disease)    a. 11/2014 inf STEMI s/p DES to RCA; 50-70% ruptured plaque in pLAD- rx medically  . GERD (gastroesophageal reflux disease)   . HTN (hypertension)   . Hyperglycemia   . Hyperlipidemia   . Hypothyroidism   . Insomnia 05/27/2017  . Ischemic cardiomyopathy    a. 11/2014  LV gram with inferior hypokinesis. EF 45-50%.  . Nocturia 01/08/2017  . Obesity 06/26/2017  . Osteoporosis   . PAF (paroxysmal atrial fibrillation) ([place])    a. isolated episode of atrial fibrillation several years ago associated w/ her  thyroid dysfunction (prior to ablation).   . Prediabetes   . Preventative health care 06/26/2017  . Vasomotor rhinitis 06/26/2017    She has a past surgical history that includes Inguinal hernia repair (Right); Umbilical hernia repair; Total knee arthroplasty (Right, 2006); Total knee arthroplasty (Left, 2011); Cataract extraction (Bilateral); Colonoscopy; Breast surgery; Eye surgery (Bilateral, 2005, 2006); ORIF humerus fracture (Right, 01/27/2014); and left heart catheterization with coronary angiogram (N/A, 12/02/2014).   Her family history includes Arthritis in her maternal aunt; Breast cancer in her maternal aunt and unknown relative; CAD in her mother; Diabetes in her paternal grandfather; Heart disease in her brother and mother; Heart failure in her mother; Hyperlipidemia in her sister; Hypertension in her brother and sister; Lung cancer in her paternal grandmother; Multiple myeloma in her father; Polycythemia in her father; Prostate cancer in her paternal grandfather; Stroke in her sister.She reports that she has never smoked. She has never used smokeless tobacco. She reports that she does not drink alcohol or use drugs.  Current Outpatient Medications on File Prior to Visit  Medication Sig Dispense Refill  . acetaminophen (TYLENOL) 500 MG tablet Take 1-2 tablets (500-1,000 mg total) by mouth every 6 (six) hours as needed for moderate pain or fever. 90 tablet 0  . acyclovir ointment (ZOVIRAX) 5 % Apply small amount to affected 5 x daily x 4 days and as needed cold sore 30 g 1  . amoxicillin (AMOXIL) 500 MG tablet Take 4 tablets by mouth as needed. (Take one (1) hour prior to dental procedures)  3  . apixaban (ELIQUIS) 5 MG TABS  tablet Take 1 tablet (5 mg total) by mouth 2 (two) times daily. 180 tablet 3  . B Complex Vitamins (B COMPLEX PO) Take 1 capsule by mouth daily.    . Calcium-Magnesium-Vitamin D (CALCIUM MAGNESIUM PO) Take 1 capsule by mouth daily.    . cetirizine (ZYRTEC) 10 MG tablet  Take 10 mg by mouth at bedtime.    . Cholecalciferol (VITAMIN D) 2000 UNITS tablet Take 4,000 Units by mouth daily.    . Coenzyme Q10 200 MG TABS Take 200 mg by mouth daily.    Marland Kitchen doxycycline (VIBRA-TABS) 100 MG tablet Take 1 tablet (100 mg total) by mouth 2 (two) times daily. 20 tablet 0  . levothyroxine (SYNTHROID, LEVOTHROID) 88 MCG tablet Take 1 tablet (88 mcg total) by mouth daily. 90 tablet 1  . metoprolol tartrate (LOPRESSOR) 25 MG tablet TAKE 1 TABLET (25 MG TOTAL) BY MOUTH 2 (TWO) TIMES DAILY. 180 tablet 3  . nitroGLYCERIN (NITROSTAT) 0.4 MG SL tablet Place 1 tablet (0.4 mg total) under the tongue every 5 (five) minutes as needed for chest pain. 25 tablet 6  . nystatin cream (MYCOSTATIN) Apply 1 application topically 2 (two) times daily as needed for dry skin (rash on abdomen). 30 g 0  . rosuvastatin (CRESTOR) 40 MG tablet Take 0.5 tablets (20 mg total) by mouth daily. 90 tablet 3  . traMADol (ULTRAM) 50 MG tablet Take 1 tablet (50 mg total) by mouth every 12 (twelve) hours as needed. (PAIN) 30 tablet 0   No current facility-administered medications on file prior to visit.      Objective:  Objective  Physical Exam  Constitutional: She is oriented to person, place, and time. She appears well-developed and well-nourished.  HENT:  Right Ear: External ear normal.  Left Ear: External ear normal.  + PND + errythema  Eyes: Conjunctivae are normal. Right eye exhibits no discharge. Left eye exhibits no discharge.  Cardiovascular: Normal rate, regular rhythm and normal heart sounds.  No murmur heard. Pulmonary/Chest: Effort normal. No respiratory distress. She has decreased breath sounds. She has wheezes. She has no rales. She exhibits no tenderness.  Musculoskeletal: She exhibits no edema.  Lymphadenopathy:    She has cervical adenopathy.  Neurological: She is alert and oriented to person, place, and time.  Nursing note and vitals reviewed.  BP (!) 134/48 (BP Location: Left Arm, Cuff  Size: Large)   Pulse 62   Temp 97.8 F (36.6 C) (Oral)   Resp 16   Ht 5' 2"  (1.575 m)   Wt 209 lb 6.4 oz (95 kg)   SpO2 97%   BMI 38.30 kg/m  Wt Readings from Last 3 Encounters:  09/23/18 209 lb 6.4 oz (95 kg)  09/20/18 208 lb (94.3 kg)  07/18/18 209 lb 12.8 oz (95.2 kg)     Lab Results  Component Value Date   WBC 4.7 07/18/2018   HGB 11.6 (L) 07/18/2018   HCT 34.6 (L) 07/18/2018   PLT 189.0 07/18/2018   GLUCOSE 97 07/18/2018   CHOL 171 07/18/2018   TRIG 106.0 07/18/2018   HDL 76.00 07/18/2018   LDLCALC 74 07/18/2018   ALT 15 07/18/2018   AST 22 07/18/2018   NA 137 07/18/2018   K 4.0 07/18/2018   CL 103 07/18/2018   CREATININE 1.17 07/18/2018   BUN 18 07/18/2018   CO2 27 07/18/2018   TSH 2.19 07/18/2018   INR 1.05 01/27/2014   HGBA1C 6.5 07/18/2018    No results found.   Assessment &  Plan:  Plan  I am having Wendy Camero. Munoz start on levofloxacin and predniSONE. I am also having her maintain her cetirizine, Vitamin D, Calcium-Magnesium-Vitamin D (CALCIUM MAGNESIUM PO), acetaminophen, Coenzyme Q10, amoxicillin, B Complex Vitamins (B COMPLEX PO), nystatin cream, acyclovir ointment, traMADol, apixaban, metoprolol tartrate, nitroGLYCERIN, rosuvastatin, levothyroxine, and doxycycline.  Meds ordered this encounter  Medications  . levofloxacin (LEVAQUIN) 500 MG tablet    Sig: Take 1 tablet (500 mg total) by mouth daily.    Dispense:  10 tablet    Refill:  0  . predniSONE (DELTASONE) 10 MG tablet    Sig: TAKE 3 TABLETS PO QD FOR 3 DAYS THEN TAKE 2 TABLETS PO QD FOR 3 DAYS THEN TAKE 1 TABLET PO QD FOR 3 DAYS THEN TAKE 1/2 TAB PO QD FOR 3 DAYS    Dispense:  20 tablet    Refill:  0    Problem List Items Addressed This Visit    None    Visit Diagnoses    Bronchitis with bronchospasm    -  Primary   Relevant Medications   levofloxacin (LEVAQUIN) 500 MG tablet   predniSONE (DELTASONE) 10 MG tablet   Cough       Relevant Orders   DG Chest 2 View (Completed)    Acute bronchitis with COPD (HCC)       Relevant Medications   predniSONE (DELTASONE) 10 MG tablet    con't inhalers  rto prn   Follow-up: No follow-ups on file.  Ann Held, DO

## 2018-09-23 NOTE — Patient Instructions (Signed)

## 2018-09-24 ENCOUNTER — Telehealth: Payer: Self-pay | Admitting: Family Medicine

## 2018-09-24 NOTE — Telephone Encounter (Signed)
Patient called again requesting alternative to Levaquin.

## 2018-09-24 NOTE — Telephone Encounter (Signed)
Copied from [place] [phone]. Topic: Quick Communication - See Telephone Encounter >> Sep 24, 2018  9:49 AM Rutherford Nail, NT wrote: CRM for notification. See Telephone encounter for: 09/24/18. Patient calling and would like to know if Dr Etter Sjogren would be willing to prescribe different medication to treat her bronchitis. States that she read the side effects for both medications, levofloxacin (LEVAQUIN) 500 MG tablet     and   predniSONE (DELTASONE) 10 MG tablet and does not with to take them if there is a chance that it could make things worse. States that she already has tendonitis due to the dislocation of her shoulder. Please advise.  CB#: [phone] [id number] - [place], [place] - [address]

## 2018-09-24 NOTE — Telephone Encounter (Signed)
Remind her there are potential side effects to everything  biaxin xl 1 po bid x 7 days

## 2018-09-24 NOTE — Telephone Encounter (Signed)
Pt is calling back, she is expecting call back by end of day. She wants different medication called in due to the potential side effects of medication.    Sending high priority for patient satisfaction

## 2018-09-25 MED ORDER — CLARITHROMYCIN ER 500 MG PO TB24: 500.0000 mg | ORAL_TABLET | Freq: Two times a day (BID) | ORAL | 0 refills | Status: DC

## 2018-09-25 NOTE — Telephone Encounter (Signed)
Sent in antibiotic.PAITENT informed of PCP instructions regarding medication---she verbalized understanding.

## 2018-09-30 ENCOUNTER — Telehealth: Payer: Self-pay

## 2018-09-30 NOTE — Telephone Encounter (Signed)
**Note De-Identified Wendy Munoz Obfuscation** The pt brought her completed BMS pt asst application to the office, Dr. Acie Fredrickson has signed it and I have faxed all to BMS.

## 2018-10-04 ENCOUNTER — Telehealth: Payer: Self-pay | Admitting: Interventional Cardiology

## 2018-10-04 NOTE — Telephone Encounter (Signed)
New message   Patient calling the office for samples of medication:   1.  What medication and dosage are you requesting samples for? Eliquis  2.  Are you currently out of this medication? No

## 2018-10-04 NOTE — Telephone Encounter (Signed)
Spoke with patient and made her aware that I would place two weeks of samples at the front desk. She verbalized her understanding and appreciation.

## 2018-10-14 ENCOUNTER — Telehealth: Payer: Self-pay

## 2018-10-14 NOTE — Telephone Encounter (Signed)
Pt Asst approved for Eliquis [id number]  Application Case# [id number]

## 2018-11-05 ENCOUNTER — Other Ambulatory Visit: Payer: Self-pay | Admitting: Family Medicine

## 2018-11-05 ENCOUNTER — Other Ambulatory Visit: Payer: Self-pay

## 2018-11-05 MED ORDER — LEVOTHYROXINE SODIUM 88 MCG PO TABS: 88.0000 ug | ORAL_TABLET | Freq: Every day | ORAL | 1 refills | Status: DC

## 2018-11-20 DIAGNOSIS — Z1231 Encounter for screening mammogram for malignant neoplasm of breast: Secondary | ICD-10-CM | POA: Diagnosis not present

## 2018-11-20 LAB — HM MAMMOGRAPHY

## 2018-11-25 ENCOUNTER — Telehealth: Payer: Self-pay | Admitting: *Deleted

## 2018-11-25 NOTE — Telephone Encounter (Signed)
Received Mammogram results from [facility]; forwarded to provider/SLS 02/03   

## 2018-11-26 ENCOUNTER — Telehealth: Payer: Self-pay

## 2018-11-26 NOTE — Telephone Encounter (Signed)
**Note De-Identified Wendy Munoz Obfuscation** I have done an Eliquis PA through covermymeds. Key: [id number]

## 2018-12-03 ENCOUNTER — Telehealth: Payer: Self-pay | Admitting: Interventional Cardiology

## 2018-12-03 NOTE — Telephone Encounter (Signed)
Pt states that she received a 90 day supply of Eliquis back in December from [place] assistance.  Pt states she requested a refill from her pharmacy recently because she had the money to pay the $90 co pay.  She received a letter from her insurance that this refill was denied at this time since Valentino Hue had helped her out in December.  Pt was just calling to let us know she wasn't trying to "hide away" medications, she just wanted to go ahead and get it while she had the money.  Advised the pt that is totally fine. Pt appreciative for call.

## 2018-12-03 NOTE — Telephone Encounter (Signed)
Patient called wanting to speak to nurse about a problem she is having with her Eliquis prescription.  She wouldn't go into further detail.

## 2018-12-05 ENCOUNTER — Encounter: Payer: Self-pay | Admitting: Family Medicine

## 2019-01-10 ENCOUNTER — Telehealth: Payer: Self-pay | Admitting: Interventional Cardiology

## 2019-01-10 NOTE — Telephone Encounter (Signed)
Spoke with patient and she stated that since she received short term patient assistance in December she has had difficulty in obtaining refills from her pharmacy. Her insurance put a block on her refill. She was able to resolve this but the pharmacy will not have this medication back in stock until Monday. Patient aware that I will place some samples for her. She verbalized her understanding and appreciation.

## 2019-01-10 NOTE — Telephone Encounter (Signed)
New message   Patient calling the office for samples of medication:   1.  What medication and dosage are you requesting samples for?apixaban (ELIQUIS) 5 MG TABS tablet   2.  Are you currently out of this medication? No, 2 days of medication left

## 2019-01-23 ENCOUNTER — Ambulatory Visit: Payer: PPO | Admitting: Family Medicine

## 2019-02-03 ENCOUNTER — Telehealth: Payer: Self-pay

## 2019-02-03 NOTE — Telephone Encounter (Signed)
Copied from [place] [id number]. Topic: Appointment Scheduling - Scheduling Inquiry for Clinic >> Jan 31, 2019 12:57 PM Yvette Rack wrote: Reason for CRM: Pt requests that the 02/04/19 appt be cancelled

## 2019-02-04 ENCOUNTER — Ambulatory Visit: Payer: PPO | Admitting: Family Medicine

## 2019-02-04 NOTE — Telephone Encounter (Signed)
Patient has appt with pcp on 02/06/19

## 2019-02-07 ENCOUNTER — Ambulatory Visit (INDEPENDENT_AMBULATORY_CARE_PROVIDER_SITE_OTHER): Payer: PPO | Admitting: Family Medicine

## 2019-02-07 ENCOUNTER — Other Ambulatory Visit: Payer: Self-pay

## 2019-02-07 DIAGNOSIS — R739 Hyperglycemia, unspecified: Secondary | ICD-10-CM | POA: Diagnosis not present

## 2019-02-07 DIAGNOSIS — E039 Hypothyroidism, unspecified: Secondary | ICD-10-CM | POA: Diagnosis not present

## 2019-02-07 DIAGNOSIS — I10 Essential (primary) hypertension: Secondary | ICD-10-CM

## 2019-02-07 NOTE — Assessment & Plan Note (Signed)
hgba1c acceptable, minimize simple carbs. Increase exercise as tolerated.  

## 2019-02-07 NOTE — Assessment & Plan Note (Signed)
On Levothyroxine, continue to monitor 

## 2019-02-07 NOTE — Assessment & Plan Note (Signed)
Well controlled encouraged to monitor and report any concerns.

## 2019-02-07 NOTE — Progress Notes (Signed)
Virtual Visit via Telephone Note  I connected with Wendy Munoz on 02/07/19 at 10:20 AM EDT by a video enabled telemedicine application and verified that I am speaking with the correct person using two identifiers.   I discussed the limitations of evaluation and management by telemedicine and the availability of in person appointments. The patient expressed understanding and agreed to proceed. [name], CMA attempted to get patient on the the video platform but patient was unable to complete so visit was completed via telephone.     Subjective:    Patient ID: Wendy Munoz, female    DOB: Jun 02, 1941, 78 y.o.   MRN: [id number]  No chief complaint on file.   HPI Patient is in today for follow up on chronic medical concerns including hyperlipidemia, anemia, reflux, CAD, HTN, hyperglycemia and hypothyroidism. She reports the bronchitis she was struggling with has resolved. No persistent symptoms. No c/o polyuria or polydipsia. Denies CP/palp/SOB/HA/congestion/fevers/GI or GU c/o. Taking meds as prescribed. They are handling the quarantine well.   Past Medical History:  Diagnosis Date  . Allergic state 06/26/2017  . Anemia 05/21/2017  . Arthritis   . Atrial tachycardia ([place])    a. asymptomatic. Noted on tele during 11/2014 admission   . CAD (coronary artery disease)    a. 11/2014 inf STEMI s/p DES to RCA; 50-70% ruptured plaque in pLAD- rx medically  . GERD (gastroesophageal reflux disease)   . HTN (hypertension)   . Hyperglycemia   . Hyperlipidemia   . Hypothyroidism   . Insomnia 05/27/2017  . Ischemic cardiomyopathy    a. 11/2014  LV gram with inferior hypokinesis. EF 45-50%.  . Nocturia 01/08/2017  . Obesity 06/26/2017  . Osteoporosis   . PAF (paroxysmal atrial fibrillation) ([place])    a. isolated episode of atrial fibrillation several years ago associated w/ her thyroid dysfunction (prior to ablation).   . Prediabetes   . Preventative health care 06/26/2017  . Vasomotor rhinitis  06/26/2017    Past Surgical History:  Procedure Laterality Date  . BREAST SURGERY     breast biospy  . CATARACT EXTRACTION Bilateral   . COLONOSCOPY    . EYE SURGERY Bilateral 2005, 2006   Cataract with lens ImplaNT   . INGUINAL HERNIA REPAIR Right   . LEFT HEART CATHETERIZATION WITH CORONARY ANGIOGRAM N/A 12/02/2014   Procedure: LEFT HEART CATHETERIZATION WITH CORONARY ANGIOGRAM;  Surgeon: Sinclair Grooms, MD;  Location[facility] C[facility] LAB;  Service: Cardiovascular;  Laterality: N/A;  . ORIF HUMERUS FRACTURE Right 01/27/2014   Procedure: RIGHT OPEN REDUCTION INTERNAL FIXATION (ORIF) PROXIMAL HUMERUS FRACTURE;  Surgeon: Rozanna Box, MD;  Location: [facility];  Service: Orthopedics;  Laterality: Right;  . TOTAL KNEE ARTHROPLASTY Right 2006  . TOTAL KNEE ARTHROPLASTY Left 2011  . UMBILICAL HERNIA REPAIR     as a baby    Family History  Problem Relation Age of Onset  . CAD Mother   . Heart disease Mother   . Heart failure Mother   . Polycythemia Father   . Multiple myeloma Father   . Prostate cancer Paternal Grandfather   . Diabetes Paternal Grandfather   . Lung cancer Paternal Grandmother   . Breast cancer Unknown   . Arthritis Maternal Aunt   . Breast cancer Maternal Aunt   . Stroke Sister   . Hyperlipidemia Sister   . Hypertension Sister   . Heart disease Brother        bradycardia  . Hypertension Brother  Social History   Socioeconomic History  . Marital status: Married    Spouse name: Not on file  . Number of children: Not on file  . Years of education: Not on file  . Highest education level: Not on file  Occupational History  . Occupation: retired   Scientific laboratory technician  . Financial resource strain: Not on file  . Food insecurity:    Worry: Not on file    Inability: Not on file  . Transportation needs:    Medical: Not on file    Non-medical: Not on file  Tobacco Use  . Smoking status: Never Smoker  . Smokeless tobacco: Never Used  Substance and Sexual Activity  .  Alcohol use: No    Alcohol/week: 0.0 standard drinks  . Drug use: No  . Sexual activity: Not on file  Lifestyle  . Physical activity:    Days per week: Not on file    Minutes per session: Not on file  . Stress: Not on file  Relationships  . Social connections:    Talks on phone: Not on file    Gets together: Not on file    Attends religious service: Not on file    Active member of club or organization: Not on file    Attends meetings of clubs or organizations: Not on file    Relationship status: Not on file  . Intimate partner violence:    Fear of current or ex partner: Not on file    Emotionally abused: Not on file    Physically abused: Not on file    Forced sexual activity: Not on file  Other Topics Concern  . Not on file  Social History Narrative  . Not on file    Outpatient Medications Prior to Visit  Medication Sig Dispense Refill  . acetaminophen (TYLENOL) 500 MG tablet Take 1-2 tablets (500-1,000 mg total) by mouth every 6 (six) hours as needed for moderate pain or fever. 90 tablet 0  . acyclovir ointment (ZOVIRAX) 5 % Apply small amount to affected 5 x daily x 4 days and as needed cold sore 30 g 1  . apixaban (ELIQUIS) 5 MG TABS tablet Take 1 tablet (5 mg total) by mouth 2 (two) times daily. 180 tablet 3  . B Complex Vitamins (B COMPLEX PO) Take 1 capsule by mouth daily.    . Calcium-Magnesium-Vitamin D (CALCIUM MAGNESIUM PO) Take 1 capsule by mouth daily.    . cetirizine (ZYRTEC) 10 MG tablet Take 10 mg by mouth at bedtime.    . Cholecalciferol (VITAMIN D) 2000 UNITS tablet Take 4,000 Units by mouth daily.    . Coenzyme Q10 200 MG TABS Take 200 mg by mouth daily.    Marland Kitchen levothyroxine (SYNTHROID, LEVOTHROID) 88 MCG tablet Take 1 tablet (88 mcg total) by mouth daily. 90 tablet 1  . metoprolol tartrate (LOPRESSOR) 25 MG tablet TAKE 1 TABLET (25 MG TOTAL) BY MOUTH 2 (TWO) TIMES DAILY. 180 tablet 3  . nitroGLYCERIN (NITROSTAT) 0.4 MG SL tablet Place 1 tablet (0.4 mg total)  under the tongue every 5 (five) minutes as needed for chest pain. 25 tablet 6  . nystatin cream (MYCOSTATIN) Apply 1 application topically 2 (two) times daily as needed for dry skin (rash on abdomen). 30 g 0  . rosuvastatin (CRESTOR) 40 MG tablet Take 0.5 tablets (20 mg total) by mouth daily. 90 tablet 3  . traMADol (ULTRAM) 50 MG tablet Take 1 tablet (50 mg total) by mouth every 12 (twelve)  hours as needed. (PAIN) 30 tablet 0  . amoxicillin (AMOXIL) 500 MG tablet Take 4 tablets by mouth as needed. (Take one (1) hour prior to dental procedures)  3  . clarithromycin (BIAXIN XL) 500 MG 24 hr tablet Take 1 tablet (500 mg total) by mouth 2 (two) times daily. 14 tablet 0  . doxycycline (VIBRA-TABS) 100 MG tablet Take 1 tablet (100 mg total) by mouth 2 (two) times daily. 20 tablet 0  . predniSONE (DELTASONE) 10 MG tablet TAKE 3 TABLETS PO QD FOR 3 DAYS THEN TAKE 2 TABLETS PO QD FOR 3 DAYS THEN TAKE 1 TABLET PO QD FOR 3 DAYS THEN TAKE 1/2 TAB PO QD FOR 3 DAYS 20 tablet 0   No facility-administered medications prior to visit.     Allergies  Allergen Reactions  . Keflex [Cephalexin] Rash    Review of Systems  Constitutional: Negative for fever and malaise/fatigue.  HENT: Negative for congestion.   Eyes: Negative for blurred vision.  Respiratory: Negative for shortness of breath.   Cardiovascular: Negative for chest pain, palpitations and leg swelling.  Gastrointestinal: Negative for abdominal pain, blood in stool and nausea.  Genitourinary: Negative for dysuria and frequency.  Musculoskeletal: Negative for falls.  Skin: Negative for rash.  Neurological: Negative for dizziness, loss of consciousness and headaches.  Endo/Heme/Allergies: Negative for environmental allergies.  Psychiatric/Behavioral: Negative for depression. The patient is not nervous/anxious.        Objective:    Physical Exam Unable to obtain through telephone platform  There were no vitals taken for this visit. Wt  Readings from Last 3 Encounters:  09/23/18 209 lb 6.4 oz (95 kg)  09/20/18 208 lb (94.3 kg)  07/18/18 209 lb 12.8 oz (95.2 kg)    Diabetic Foot Exam - Simple   No data filed     Lab Results  Component Value Date   WBC 4.7 07/18/2018   HGB 11.6 (L) 07/18/2018   HCT 34.6 (L) 07/18/2018   PLT 189.0 07/18/2018   GLUCOSE 97 07/18/2018   CHOL 171 07/18/2018   TRIG 106.0 07/18/2018   HDL 76.00 07/18/2018   LDLCALC 74 07/18/2018   ALT 15 07/18/2018   AST 22 07/18/2018   NA 137 07/18/2018   K 4.0 07/18/2018   CL 103 07/18/2018   CREATININE 1.17 07/18/2018   BUN 18 07/18/2018   CO2 27 07/18/2018   TSH 2.19 07/18/2018   INR 1.05 01/27/2014   HGBA1C 6.5 07/18/2018    Lab Results  Component Value Date   TSH 2.19 07/18/2018   Lab Results  Component Value Date   WBC 4.7 07/18/2018   HGB 11.6 (L) 07/18/2018   HCT 34.6 (L) 07/18/2018   MCV 94.2 07/18/2018   PLT 189.0 07/18/2018   Lab Results  Component Value Date   NA 137 07/18/2018   K 4.0 07/18/2018   CO2 27 07/18/2018   GLUCOSE 97 07/18/2018   BUN 18 07/18/2018   CREATININE 1.17 07/18/2018   BILITOT 0.8 07/18/2018   ALKPHOS 56 07/18/2018   AST 22 07/18/2018   ALT 15 07/18/2018   PROT 6.9 07/18/2018   ALBUMIN 4.2 07/18/2018   CALCIUM 9.3 07/18/2018   ANIONGAP 7 04/15/2015   GFR 47.70 (L) 07/18/2018   Lab Results  Component Value Date   CHOL 171 07/18/2018   Lab Results  Component Value Date   HDL 76.00 07/18/2018   Lab Results  Component Value Date   LDLCALC 74 07/18/2018   Lab Results  Component Value Date   TRIG 106.0 07/18/2018   Lab Results  Component Value Date   CHOLHDL 2 07/18/2018   Lab Results  Component Value Date   HGBA1C 6.5 07/18/2018       Assessment & Plan:   Problem List Items Addressed This Visit    Hypothyroidism    On Levothyroxine, continue to monitor      Hyperglycemia    hgba1c acceptable, minimize simple carbs. Increase exercise as tolerated.       HTN  (hypertension)    Well controlled encouraged to monitor and report any concerns.          I have discontinued Wendy Munoz. Frankie's amoxicillin, doxycycline, predniSONE, and clarithromycin. I am also having her maintain her cetirizine, Vitamin D, Calcium-Magnesium-Vitamin D (CALCIUM MAGNESIUM PO), acetaminophen, Coenzyme Q10, B Complex Vitamins (B COMPLEX PO), nystatin cream, acyclovir ointment, traMADol, apixaban, metoprolol tartrate, nitroGLYCERIN, rosuvastatin, and levothyroxine.  No orders of the defined types were placed in this encounter.     I discussed the assessment and treatment plan with the patient. The patient was provided an opportunity to ask questions and all were answered. The patient agreed with the plan and demonstrated an understanding of the instructions.   The patient was advised to call back or seek an in-person evaluation if the symptoms worsen or if the condition fails to improve as anticipated.  I provided non-face-to-face time during this encounter.   Penni Homans, MD

## 2019-02-11 NOTE — Progress Notes (Signed)
Virtual Visit via Video Note   This visit type was conducted due to national recommendations for restrictions regarding the COVID-19 Pandemic (e.g. social distancing) in an effort to limit this patient's exposure and mitigate transmission in our community.  Due to her co-morbid illnesses, this patient is at least at moderate risk for complications without adequate follow up.  This format is felt to be most appropriate for this patient at this time.  All issues noted in this document were discussed and addressed.  A limited physical exam was performed with this format.  Please refer to the patient's chart for her consent to telehealth for [facility].   Evaluation Performed:  Follow-up visit  Date:  02/12/2019   ID:  Wendy Munoz, Wendy Munoz 04/03/1941, MRN [id number]  Patient Location: Home Provider Location: Office  PCP:  Mosie Lukes, MD  Cardiologist:  Sinclair Grooms, MD  Electrophysiologist:  None   Chief Complaint:  AF/CHF  History of Present Illness:    Wendy Munoz is a 78 y.o. female with for paroxysmal atrial fibrillation, acute inferior infarct April 2016 treated with proximal RCA DES,residual 60-75% segmental proximal LAD stenosis,hypertension, prediabetes, and chronic diastolic heart failure.  She is doing well.  We did not see each other over the past 12 months.  We are now in the [place] 19 pandemic.  This virtual visit is to check in and make sure that things are going well.  No specific complaints at this time.  She denies orthopnea, PND, lower extremity swelling, palpitations, and syncope.  Last episode of atrial fibrillation was greater than 18 months ago.  No medication side effects.  She has had a bruise in the medial left thigh.  It is slowly resolving.  The patient does not have symptoms concerning for COVID-19 infection (fever, chills, cough, or new shortness of breath).    Past Medical History:  Diagnosis Date   Allergic state 06/26/2017   Anemia  05/21/2017   Arthritis    Atrial tachycardia ([place])    a. asymptomatic. Noted on tele during 11/2014 admission    CAD (coronary artery disease)    a. 11/2014 inf STEMI s/p DES to RCA; 50-70% ruptured plaque in pLAD- rx medically   GERD (gastroesophageal reflux disease)    HTN (hypertension)    Hyperglycemia    Hyperlipidemia    Hypothyroidism    Insomnia 05/27/2017   Ischemic cardiomyopathy    a. 11/2014  LV gram with inferior hypokinesis. EF 45-50%.   Nocturia 01/08/2017   Obesity 06/26/2017   Osteoporosis    PAF (paroxysmal atrial fibrillation) ([place])    a. isolated episode of atrial fibrillation several years ago associated w/ her thyroid dysfunction (prior to ablation).    Prediabetes    Preventative health care 06/26/2017   Vasomotor rhinitis 06/26/2017   Past Surgical History:  Procedure Laterality Date   BREAST SURGERY     breast biospy   CATARACT EXTRACTION Bilateral    COLONOSCOPY     EYE SURGERY Bilateral 2005, 2006   Cataract with lens ImplaNT    INGUINAL HERNIA REPAIR Right    LEFT HEART CATHETERIZATION WITH CORONARY ANGIOGRAM N/A 12/02/2014   Procedure: LEFT HEART CATHETERIZATION WITH CORONARY ANGIOGRAM;  Surgeon: Sinclair Grooms, MD;  Location: [facility] CATH LAB;  Service: Cardiovascular;  Laterality: N/A;   ORIF HUMERUS FRACTURE Right 01/27/2014   Procedure: RIGHT OPEN REDUCTION INTERNAL FIXATION (ORIF) PROXIMAL HUMERUS FRACTURE;  Surgeon: Rozanna Box, MD;  Location: [place];  Service: Orthopedics;  Laterality: Right;   TOTAL KNEE ARTHROPLASTY Right 2006   TOTAL KNEE ARTHROPLASTY Left 2952   UMBILICAL HERNIA REPAIR     as a baby     Current Meds  Medication Sig   acetaminophen (TYLENOL) 500 MG tablet Take 1-2 tablets (500-1,000 mg total) by mouth every 6 (six) hours as needed for moderate pain or fever.   acyclovir ointment (ZOVIRAX) 5 % Apply small amount to affected 5 x daily x 4 days and as needed cold sore   apixaban (ELIQUIS) 5 MG TABS  tablet Take 1 tablet (5 mg total) by mouth 2 (two) times daily.   Calcium-Magnesium-Vitamin D (CALCIUM MAGNESIUM PO) Take 1 capsule by mouth daily.   cetirizine (ZYRTEC) 10 MG tablet Take 10 mg by mouth at bedtime.   Cholecalciferol (VITAMIN D) 2000 UNITS tablet Take 4,000 Units by mouth daily.   Coenzyme Q10 200 MG TABS Take 200 mg by mouth daily.   levothyroxine (SYNTHROID, LEVOTHROID) 88 MCG tablet Take 1 tablet (88 mcg total) by mouth daily.   metoprolol tartrate (LOPRESSOR) 25 MG tablet TAKE 1 TABLET (25 MG TOTAL) BY MOUTH 2 (TWO) TIMES DAILY.   nitroGLYCERIN (NITROSTAT) 0.4 MG SL tablet Place 1 tablet (0.4 mg total) under the tongue every 5 (five) minutes as needed for chest pain.   nystatin cream (MYCOSTATIN) Apply 1 application topically 2 (two) times daily as needed for dry skin (rash on abdomen).   rosuvastatin (CRESTOR) 40 MG tablet Take 0.5 tablets (20 mg total) by mouth daily.   traMADol (ULTRAM) 50 MG tablet Take 1 tablet (50 mg total) by mouth every 12 (twelve) hours as needed. (PAIN)   [DISCONTINUED] apixaban (ELIQUIS) 5 MG TABS tablet Take 1 tablet (5 mg total) by mouth 2 (two) times daily.   [DISCONTINUED] metoprolol tartrate (LOPRESSOR) 25 MG tablet TAKE 1 TABLET (25 MG TOTAL) BY MOUTH 2 (TWO) TIMES DAILY.     Allergies:   Keflex [cephalexin]   Social History   Tobacco Use   Smoking status: Never Smoker   Smokeless tobacco: Never Used  Substance Use Topics   Alcohol use: No    Alcohol/week: 0.0 standard drinks   Drug use: No     Family Hx: The patient's family history includes Arthritis in her maternal aunt; Breast cancer in her maternal aunt and unknown relative; CAD in her mother; Diabetes in her paternal grandfather; Heart disease in her brother and mother; Heart failure in her mother; Hyperlipidemia in her sister; Hypertension in her brother and sister; Lung cancer in her paternal grandmother; Multiple myeloma in her father; Polycythemia in her  father; Prostate cancer in her paternal grandfather; Stroke in her sister.  ROS:   Please see the history of present illness.    Bruise in thigh All other systems reviewed and are negative.   Prior CV studies:   The following studies were reviewed today:   No new imaging or functional data.  Labs/Other Tests and Data Reviewed:    EKG:  No ECG reviewed.  Recent Labs: 07/18/2018: ALT 15; BUN 18; Creatinine, Ser 1.17; Hemoglobin 11.6; Platelets 189.0; Potassium 4.0; Sodium 137; TSH 2.19   Recent Lipid Panel Lab Results  Component Value Date/Time   CHOL 171 07/18/2018 03:01 PM   TRIG 106.0 07/18/2018 03:01 PM   HDL 76.00 07/18/2018 03:01 PM   CHOLHDL 2 07/18/2018 03:01 PM   LDLCALC 74 07/18/2018 03:01 PM    Wt Readings from Last 3 Encounters:  02/12/19 215 lb (97.5  kg)  09/23/18 209 lb 6.4 oz (95 kg)  09/20/18 208 lb (94.3 kg)     Objective:    Vital Signs:  BP 127/62    Pulse 77    Ht 5' 2"  (1.575 m)    Wt 215 lb (97.5 kg)    SpO2 96%    BMI 39.32 kg/m    VITAL SIGNS:  reviewed  ASSESSMENT & PLAN:    1. PAF (paroxysmal atrial fibrillation) ([place])   2. Coronary artery disease involving native coronary artery of native heart without angina pectoris   3. Hyperlipidemia, unspecified hyperlipidemia type   4. Essential hypertension   5. Snoring   6. On apixaban therapy    PLAN:  1. No clinical recurrence of atrial fibrillation.  Currently taking apixaban without complications. 2. No anginal or anginal equivalent complaints.  Secondary prevention discussed. 3. Last LDL 9 months ago was near target, less than 90. 4. Blood pressure target 130/80 mmHg, measured today and with in the target range. 5. Continue CPAP therapy. 6. Bruise in the inner left thigh due to apixaban, slowly resolving over the past 6 weeks.  She wondered if apixaban intensity could be decreased.  We discussed the dosing criteria, and she still fits within the full dose range based upon weight, age,  and kidney function.  Overall education and awareness concerning primary/secondary risk prevention was discussed in detail: LDL less than 70, hemoglobin A1c less than 7, blood pressure target less than 130/80 mmHg, >150 minutes of moderate aerobic activity per week, avoidance of smoking, weight control (via diet and exercise), and continued surveillance/management of/for obstructive sleep apnea.  Plan follow-up in 9 months.  COVID-19 Education: The signs and symptoms of COVID-19 were discussed with the patient and how to seek care for testing (follow up with PCP or arrange E-visit).  The importance of social distancing was discussed today.  Time:   Today, I have spent 15 minutes with the patient with telehealth technology discussing the above problems.     Medication Adjustments/Labs and Tests Ordered: Current medicines are reviewed at length with the patient today.  Concerns regarding medicines are outlined above.   Tests Ordered: No orders of the defined types were placed in this encounter.   Medication Changes: Meds ordered this encounter  Medications   metoprolol tartrate (LOPRESSOR) 25 MG tablet    Sig: TAKE 1 TABLET (25 MG TOTAL) BY MOUTH 2 (TWO) TIMES DAILY.    Dispense:  180 tablet    Refill:  3   apixaban (ELIQUIS) 5 MG TABS tablet    Sig: Take 1 tablet (5 mg total) by mouth 2 (two) times daily.    Dispense:  180 tablet    Refill:  3    Disposition:  Follow up in 9 month(s)  Signed, Sinclair Grooms, MD  02/12/2019 12:08 PM    [facility]

## 2019-02-12 ENCOUNTER — Encounter: Payer: Self-pay | Admitting: Interventional Cardiology

## 2019-02-12 ENCOUNTER — Other Ambulatory Visit: Payer: Self-pay

## 2019-02-12 ENCOUNTER — Telehealth (INDEPENDENT_AMBULATORY_CARE_PROVIDER_SITE_OTHER): Payer: PPO | Admitting: Interventional Cardiology

## 2019-02-12 VITALS — BP 127/62 | HR 77 | Ht 62.0 in | Wt 215.0 lb

## 2019-02-12 DIAGNOSIS — I2510 Atherosclerotic heart disease of native coronary artery without angina pectoris: Secondary | ICD-10-CM | POA: Diagnosis not present

## 2019-02-12 DIAGNOSIS — Z7901 Long term (current) use of anticoagulants: Secondary | ICD-10-CM

## 2019-02-12 DIAGNOSIS — R0683 Snoring: Secondary | ICD-10-CM | POA: Diagnosis not present

## 2019-02-12 DIAGNOSIS — E785 Hyperlipidemia, unspecified: Secondary | ICD-10-CM

## 2019-02-12 DIAGNOSIS — I10 Essential (primary) hypertension: Secondary | ICD-10-CM | POA: Diagnosis not present

## 2019-02-12 DIAGNOSIS — I480 Paroxysmal atrial fibrillation: Secondary | ICD-10-CM | POA: Diagnosis not present

## 2019-02-12 HISTORY — DX: Long term (current) use of anticoagulants: Z79.01

## 2019-02-12 MED ORDER — METOPROLOL TARTRATE 25 MG PO TABS: ORAL_TABLET | ORAL | 3 refills | Status: DC

## 2019-02-12 MED ORDER — APIXABAN 5 MG PO TABS: 5.0000 mg | ORAL_TABLET | Freq: Two times a day (BID) | ORAL | 3 refills | Status: DC

## 2019-02-12 NOTE — Patient Instructions (Signed)
Medication Instructions:  Your physician recommends that you continue on your current medications as directed. Please refer to the Current Medication list given to you today.  If you need a refill on your cardiac medications before your next appointment, please call your pharmacy.   Lab work: None If you have labs (blood work) drawn today and your tests are completely normal, you will receive your results only by: . MyChart Message (if you have MyChart) OR . A paper copy in the mail If you have any lab test that is abnormal or we need to change your treatment, we will call you to review the results.  Testing/Procedures: None  Follow-Up: At [facility], you and your health needs are our priority.  As part of our continuing mission to provide you with exceptional heart care, we have created designated Provider Care Teams.  These Care Teams include your primary Cardiologist (physician) and Advanced Practice Providers (APPs -  Physician Assistants and Nurse Practitioners) who all work together to provide you with the care you need, when you need it. You will need a follow up appointment in 9 months.  Please call our office 2 months in advance to schedule this appointment.  You may see Henry W Smith III, MD or one of the following Advanced Practice Providers on your designated Care Team:   Lori Gerhardt, NP Hilma Ingold, NP . Jill McDaniel, NP  Any Other Special Instructions Will Be Listed Below (If Applicable).    

## 2019-02-14 ENCOUNTER — Telehealth: Payer: PPO | Admitting: Interventional Cardiology

## 2019-02-21 ENCOUNTER — Ambulatory Visit: Payer: PPO | Admitting: Interventional Cardiology

## 2019-05-08 ENCOUNTER — Ambulatory Visit (INDEPENDENT_AMBULATORY_CARE_PROVIDER_SITE_OTHER): Payer: PPO | Admitting: Family Medicine

## 2019-05-08 ENCOUNTER — Other Ambulatory Visit: Payer: Self-pay

## 2019-05-08 DIAGNOSIS — R739 Hyperglycemia, unspecified: Secondary | ICD-10-CM

## 2019-05-08 DIAGNOSIS — R350 Frequency of micturition: Secondary | ICD-10-CM

## 2019-05-08 DIAGNOSIS — I10 Essential (primary) hypertension: Secondary | ICD-10-CM | POA: Diagnosis not present

## 2019-05-08 DIAGNOSIS — E039 Hypothyroidism, unspecified: Secondary | ICD-10-CM | POA: Diagnosis not present

## 2019-05-08 DIAGNOSIS — K649 Unspecified hemorrhoids: Secondary | ICD-10-CM

## 2019-05-08 DIAGNOSIS — E785 Hyperlipidemia, unspecified: Secondary | ICD-10-CM

## 2019-05-08 MED ORDER — ROSUVASTATIN CALCIUM 40 MG PO TABS: 20.0000 mg | ORAL_TABLET | Freq: Every day | ORAL | 1 refills | Status: DC

## 2019-05-08 MED ORDER — AMOXICILLIN 500 MG PO CAPS: 500.0000 mg | ORAL_CAPSULE | Freq: Two times a day (BID) | ORAL | 0 refills | Status: DC

## 2019-05-08 MED ORDER — HYDROCORTISONE ACETATE 25 MG RE SUPP: 25.0000 mg | Freq: Every evening | RECTAL | 1 refills | Status: DC | PRN

## 2019-05-11 DIAGNOSIS — K649 Unspecified hemorrhoids: Secondary | ICD-10-CM

## 2019-05-11 HISTORY — DX: Unspecified hemorrhoids: K64.9

## 2019-05-11 NOTE — Assessment & Plan Note (Signed)
On Levothyroxine, continue to monitor 

## 2019-05-11 NOTE — Progress Notes (Signed)
Virtual Visit via phone Note  I connected with Wendy Munoz on 05/08/19 at  1:40 PM EDT by a phone enabled telemedicine application and verified that I am speaking with the correct person using two identifiers.  Location: Patient: home Provider: home   I discussed the limitations of evaluation and management by telemedicine and the availability of in person appointments. The patient expressed understanding and agreed to proceed. [name], CMA was able to get patient set up on phone after being unable to arrange a video visit    Subjective:    Patient ID: Wendy Munoz, female    DOB: Jul 11, 1941, 78 y.o.   MRN: [id number]  No chief complaint on file.   HPI Patient is in today for follow up on chronic medical concerns including hypertension, hyperlipidemia, hyperglycemia and more. No recent febrile illness or hospitalizations. No polydipsia. But is noting some polyuria. Notes rectal irrtation, hemorrhoids and loose stool at times. Denies CP/palp/SOB/HA//fevers or GU c/o. Taking meds as prescribed  Past Medical History:  Diagnosis Date  . Allergic state 06/26/2017  . Anemia 05/21/2017  . Arthritis   . Atrial tachycardia ([place])    a. asymptomatic. Noted on tele during 11/2014 admission   . CAD (coronary artery disease)    a. 11/2014 inf STEMI s/p DES to RCA; 50-70% ruptured plaque in pLAD- rx medically  . GERD (gastroesophageal reflux disease)   . HTN (hypertension)   . Hyperglycemia   . Hyperlipidemia   . Hypothyroidism   . Insomnia 05/27/2017  . Ischemic cardiomyopathy    a. 11/2014  LV gram with inferior hypokinesis. EF 45-50%.  . Nocturia 01/08/2017  . Obesity 06/26/2017  . Osteoporosis   . PAF (paroxysmal atrial fibrillation) ([place])    a. isolated episode of atrial fibrillation several years ago associated w/ her thyroid dysfunction (prior to ablation).   . Prediabetes   . Preventative health care 06/26/2017  . Vasomotor rhinitis 06/26/2017    Past Surgical History:   Procedure Laterality Date  . BREAST SURGERY     breast biospy  . CATARACT EXTRACTION Bilateral   . COLONOSCOPY    . EYE SURGERY Bilateral 2005, 2006   Cataract with lens ImplaNT   . INGUINAL HERNIA REPAIR Right   . LEFT HEART CATHETERIZATION WITH CORONARY ANGIOGRAM N/A 12/02/2014   Procedure: LEFT HEART CATHETERIZATION WITH CORONARY ANGIOGRAM;  Surgeon: Sinclair Grooms, MD;  Location: [facility] CATH LAB;  Service: Cardiovascular;  Laterality: N/A;  . ORIF HUMERUS FRACTURE Right 01/27/2014   Procedure: RIGHT OPEN REDUCTION INTERNAL FIXATION (ORIF) PROXIMAL HUMERUS FRACTURE;  Surgeon: Rozanna Box, MD;  Location: [place];  Service: Orthopedics;  Laterality: Right;  . TOTAL KNEE ARTHROPLASTY Right 2006  . TOTAL KNEE ARTHROPLASTY Left 2011  . UMBILICAL HERNIA REPAIR     as a baby    Family History  Problem Relation Age of Onset  . CAD Mother   . Heart disease Mother   . Heart failure Mother   . Polycythemia Father   . Multiple myeloma Father   . Prostate cancer Paternal Grandfather   . Diabetes Paternal Grandfather   . Lung cancer Paternal Grandmother   . Breast cancer Unknown   . Arthritis Maternal Aunt   . Breast cancer Maternal Aunt   . Stroke Sister   . Hyperlipidemia Sister   . Hypertension Sister   . Heart disease Brother        bradycardia  . Hypertension Brother     Social History  Socioeconomic History  . Marital status: Married    Spouse name: Not on file  . Number of children: Not on file  . Years of education: Not on file  . Highest education level: Not on file  Occupational History  . Occupation: retired   Scientific laboratory technician  . Financial resource strain: Not on file  . Food insecurity    Worry: Not on file    Inability: Not on file  . Transportation needs    Medical: Not on file    Non-medical: Not on file  Tobacco Use  . Smoking status: Never Smoker  . Smokeless tobacco: Never Used  Substance and Sexual Activity  . Alcohol use: No    Alcohol/week: 0.0  standard drinks  . Drug use: No  . Sexual activity: Not on file  Lifestyle  . Physical activity    Days per week: Not on file    Minutes per session: Not on file  . Stress: Not on file  Relationships  . Social Herbalist on phone: Not on file    Gets together: Not on file    Attends religious service: Not on file    Active member of club or organization: Not on file    Attends meetings of clubs or organizations: Not on file    Relationship status: Not on file  . Intimate partner violence    Fear of current or ex partner: Not on file    Emotionally abused: Not on file    Physically abused: Not on file    Forced sexual activity: Not on file  Other Topics Concern  . Not on file  Social History Narrative  . Not on file    Outpatient Medications Prior to Visit  Medication Sig Dispense Refill  . acetaminophen (TYLENOL) 500 MG tablet Take 1-2 tablets (500-1,000 mg total) by mouth every 6 (six) hours as needed for moderate pain or fever. 90 tablet 0  . acyclovir ointment (ZOVIRAX) 5 % Apply small amount to affected 5 x daily x 4 days and as needed cold sore 30 g 1  . apixaban (ELIQUIS) 5 MG TABS tablet Take 1 tablet (5 mg total) by mouth 2 (two) times daily. 180 tablet 3  . Calcium-Magnesium-Vitamin D (CALCIUM MAGNESIUM PO) Take 1 capsule by mouth daily.    . cetirizine (ZYRTEC) 10 MG tablet Take 10 mg by mouth at bedtime.    . Cholecalciferol (VITAMIN D) 2000 UNITS tablet Take 4,000 Units by mouth daily.    . Coenzyme Q10 200 MG TABS Take 200 mg by mouth daily.    Marland Kitchen levothyroxine (SYNTHROID, LEVOTHROID) 88 MCG tablet Take 1 tablet (88 mcg total) by mouth daily. 90 tablet 1  . metoprolol tartrate (LOPRESSOR) 25 MG tablet TAKE 1 TABLET (25 MG TOTAL) BY MOUTH 2 (TWO) TIMES DAILY. 180 tablet 3  . nitroGLYCERIN (NITROSTAT) 0.4 MG SL tablet Place 1 tablet (0.4 mg total) under the tongue every 5 (five) minutes as needed for chest pain. 25 tablet 6  . nystatin cream (MYCOSTATIN)  Apply 1 application topically 2 (two) times daily as needed for dry skin (rash on abdomen). 30 g 0  . traMADol (ULTRAM) 50 MG tablet Take 1 tablet (50 mg total) by mouth every 12 (twelve) hours as needed. (PAIN) 30 tablet 0  . rosuvastatin (CRESTOR) 40 MG tablet Take 0.5 tablets (20 mg total) by mouth daily. 90 tablet 3   No facility-administered medications prior to visit.     Allergies  Allergen Reactions  . Keflex [Cephalexin] Rash    Review of Systems  Constitutional: Positive for malaise/fatigue. Negative for fever.  HENT: Negative for congestion.   Eyes: Negative for blurred vision.  Respiratory: Negative for shortness of breath.   Cardiovascular: Negative for chest pain, palpitations and leg swelling.  Gastrointestinal: Positive for diarrhea. Negative for abdominal pain, blood in stool, melena and nausea.  Genitourinary: Positive for frequency. Negative for dysuria.  Musculoskeletal: Negative for falls.  Skin: Negative for rash.  Neurological: Negative for dizziness, loss of consciousness and headaches.  Endo/Heme/Allergies: Negative for environmental allergies.  Psychiatric/Behavioral: Negative for depression. The patient is not nervous/anxious.        Objective:    Physical Exam unable to obtain via phone  There were no vitals taken for this visit. Wt Readings from Last 3 Encounters:  02/12/19 215 lb (97.5 kg)  09/23/18 209 lb 6.4 oz (95 kg)  09/20/18 208 lb (94.3 kg)    Diabetic Foot Exam - Simple   No data filed     Lab Results  Component Value Date   WBC 4.7 07/18/2018   HGB 11.6 (L) 07/18/2018   HCT 34.6 (L) 07/18/2018   PLT 189.0 07/18/2018   GLUCOSE 97 07/18/2018   CHOL 171 07/18/2018   TRIG 106.0 07/18/2018   HDL 76.00 07/18/2018   LDLCALC 74 07/18/2018   ALT 15 07/18/2018   AST 22 07/18/2018   NA 137 07/18/2018   K 4.0 07/18/2018   CL 103 07/18/2018   CREATININE 1.17 07/18/2018   BUN 18 07/18/2018   CO2 27 07/18/2018   TSH 2.19 07/18/2018    INR 1.05 01/27/2014   HGBA1C 6.5 07/18/2018    Lab Results  Component Value Date   TSH 2.19 07/18/2018   Lab Results  Component Value Date   WBC 4.7 07/18/2018   HGB 11.6 (L) 07/18/2018   HCT 34.6 (L) 07/18/2018   MCV 94.2 07/18/2018   PLT 189.0 07/18/2018   Lab Results  Component Value Date   NA 137 07/18/2018   K 4.0 07/18/2018   CO2 27 07/18/2018   GLUCOSE 97 07/18/2018   BUN 18 07/18/2018   CREATININE 1.17 07/18/2018   BILITOT 0.8 07/18/2018   ALKPHOS 56 07/18/2018   AST 22 07/18/2018   ALT 15 07/18/2018   PROT 6.9 07/18/2018   ALBUMIN 4.2 07/18/2018   CALCIUM 9.3 07/18/2018   ANIONGAP 7 04/15/2015   GFR 47.70 (L) 07/18/2018   Lab Results  Component Value Date   CHOL 171 07/18/2018   Lab Results  Component Value Date   HDL 76.00 07/18/2018   Lab Results  Component Value Date   LDLCALC 74 07/18/2018   Lab Results  Component Value Date   TRIG 106.0 07/18/2018   Lab Results  Component Value Date   CHOLHDL 2 07/18/2018   Lab Results  Component Value Date   HGBA1C 6.5 07/18/2018       Assessment & Plan:   Problem List Items Addressed This Visit    Hypothyroidism    On Levothyroxine, continue to monitor      Hyperglycemia    hgba1c acceptable, minimize simple carbs. Increase exercise as tolerated.       HTN (hypertension)    Encouraged to check vitals weekly  no changes to meds. Encouraged heart healthy diet such as the DASH diet and exercise as tolerated.       Relevant Medications   rosuvastatin (CRESTOR) 40 MG tablet   Urinary frequency  Possible UTI will try course of Amox and if no response will need to bring in urine sample.       Hyperlipidemia    Encouraged heart healthy diet, increase exercise, avoid trans fats, consider a krill oil cap daily      Relevant Medications   rosuvastatin (CRESTOR) 40 MG tablet   Hemorrhoids    Notes some rectal irritation and incontinence at times. Encouraged witch hazel astringent to  clean. benefiber daily for stool bulk and hemorrhoid suppositories qhs if no improvement will let us know.       Relevant Medications   rosuvastatin (CRESTOR) 40 MG tablet      I am having Kaci Dillie. Curling start on hydrocortisone and amoxicillin. I am also having her maintain her cetirizine, Vitamin D, Calcium-Magnesium-Vitamin D (CALCIUM MAGNESIUM PO), acetaminophen, Coenzyme Q10, nystatin cream, acyclovir ointment, traMADol, nitroGLYCERIN, levothyroxine, metoprolol tartrate, apixaban, and rosuvastatin.  Meds ordered this encounter  Medications  . hydrocortisone (ANUSOL-HC) 25 MG suppository    Sig: Place 1 suppository (25 mg total) rectally at bedtime as needed for hemorrhoids or anal itching.    Dispense:  12 suppository    Refill:  1  . amoxicillin (AMOXIL) 500 MG capsule    Sig: Take 1 capsule (500 mg total) by mouth 2 (two) times daily.    Dispense:  10 capsule    Refill:  0  . rosuvastatin (CRESTOR) 40 MG tablet    Sig: Take 0.5 tablets (20 mg total) by mouth daily.    Dispense:  90 tablet    Refill:  1     I discussed the assessment and treatment plan with the patient. The patient was provided an opportunity to ask questions and all were answered. The patient agreed with the plan and demonstrated an understanding of the instructions.   The patient was advised to call back or seek an in-person evaluation if the symptoms worsen or if the condition fails to improve as anticipated.  I provided 30 minutes of non-face-to-face time during this encounter.   Penni Homans, MD

## 2019-05-11 NOTE — Assessment & Plan Note (Signed)
hgba1c acceptable, minimize simple carbs. Increase exercise as tolerated.  

## 2019-05-11 NOTE — Assessment & Plan Note (Addendum)
Encouraged to check vitals weekly no changes to meds. Encouraged heart healthy diet such as the DASH diet and exercise as tolerated.  

## 2019-05-11 NOTE — Assessment & Plan Note (Signed)
Encouraged heart healthy diet, increase exercise, avoid trans fats, consider a krill oil cap daily 

## 2019-05-11 NOTE — Assessment & Plan Note (Signed)
Possible UTI will try course of Amox and if no response will need to bring in urine sample.

## 2019-05-11 NOTE — Assessment & Plan Note (Signed)
Notes some rectal irritation and incontinence at times. Encouraged witch hazel astringent to clean. benefiber daily for stool bulk and hemorrhoid suppositories qhs if no improvement will let us know.

## 2019-05-15 ENCOUNTER — Telehealth: Payer: Self-pay | Admitting: Family Medicine

## 2019-05-15 DIAGNOSIS — I10 Essential (primary) hypertension: Secondary | ICD-10-CM

## 2019-05-15 DIAGNOSIS — R739 Hyperglycemia, unspecified: Secondary | ICD-10-CM

## 2019-05-15 DIAGNOSIS — E039 Hypothyroidism, unspecified: Secondary | ICD-10-CM

## 2019-05-15 DIAGNOSIS — E785 Hyperlipidemia, unspecified: Secondary | ICD-10-CM

## 2019-05-15 NOTE — Telephone Encounter (Signed)
Pt called stating she needs to have labs done for A1C, thyroid, and routine labs. No lab orders in. Also states needing pneumonia shot. Please advise.

## 2019-05-18 ENCOUNTER — Other Ambulatory Visit: Payer: Self-pay | Admitting: Family Medicine

## 2019-05-19 NOTE — Telephone Encounter (Signed)
Patient calling to check status of this request.  

## 2019-05-19 NOTE — Telephone Encounter (Signed)
Labs have been placed please schedule lab appt

## 2019-05-19 NOTE — Telephone Encounter (Signed)
Pt calling to check status. Please let me know when orders are in. Pt states that she has only 1 wk on her thyroid medication left. She doesn't want to fill until she has results.

## 2019-05-19 NOTE — Telephone Encounter (Signed)
Please advise 

## 2019-05-19 NOTE — Telephone Encounter (Signed)
Yes please arrange a lab appt and order cmp, cbc, tsh, lipid, hgba1c, urine for microalb and on the same day she needs a nurse visit to get the pneumovax shot

## 2019-05-20 ENCOUNTER — Other Ambulatory Visit (INDEPENDENT_AMBULATORY_CARE_PROVIDER_SITE_OTHER): Payer: PPO

## 2019-05-20 ENCOUNTER — Ambulatory Visit (INDEPENDENT_AMBULATORY_CARE_PROVIDER_SITE_OTHER): Payer: PPO | Admitting: *Deleted

## 2019-05-20 ENCOUNTER — Other Ambulatory Visit: Payer: Self-pay

## 2019-05-20 DIAGNOSIS — R739 Hyperglycemia, unspecified: Secondary | ICD-10-CM

## 2019-05-20 DIAGNOSIS — Z23 Encounter for immunization: Secondary | ICD-10-CM

## 2019-05-20 DIAGNOSIS — I10 Essential (primary) hypertension: Secondary | ICD-10-CM | POA: Diagnosis not present

## 2019-05-20 DIAGNOSIS — E785 Hyperlipidemia, unspecified: Secondary | ICD-10-CM

## 2019-05-20 LAB — LIPID PANEL
Cholesterol: 173 mg/dL (ref 0–200)
HDL: 82.1 mg/dL (ref >39.00)
LDL Cholesterol: 52 mg/dL (ref 0–99)
NonHDL: 91.03
Total CHOL/HDL Ratio: 2
Triglycerides: 193 mg/dL — ABNORMAL HIGH (ref 0.0–149.0)
VLDL: 38.6 mg/dL (ref 0.0–40.0)

## 2019-05-20 LAB — COMPREHENSIVE METABOLIC PANEL
ALT: 13 U/L (ref 0–35)
AST: 21 U/L (ref 0–37)
Albumin: 4.2 g/dL (ref 3.5–5.2)
Alkaline Phosphatase: 64 U/L (ref 39–117)
BUN: 18 mg/dL (ref 6–23)
CO2: 27 mEq/L (ref 19–32)
Calcium: 9.4 mg/dL (ref 8.4–10.5)
Chloride: 103 mEq/L (ref 96–112)
Creatinine, Ser: 1.23 mg/dL — ABNORMAL HIGH (ref 0.40–1.20)
GFR: 42.27 mL/min — ABNORMAL LOW (ref >60.00)
Glucose, Bld: 97 mg/dL (ref 70–99)
Potassium: 4 mEq/L (ref 3.5–5.1)
Sodium: 138 mEq/L (ref 135–145)
Total Bilirubin: 0.7 mg/dL (ref 0.2–1.2)
Total Protein: 7.1 g/dL (ref 6.0–8.3)

## 2019-05-20 LAB — TSH: TSH: 2.35 u[IU]/mL (ref 0.35–4.50)

## 2019-05-20 LAB — CBC
HCT: 36.7 % (ref 36.0–46.0)
Hemoglobin: 12.2 g/dL (ref 12.0–15.0)
MCHC: 33.3 g/dL (ref 30.0–36.0)
MCV: 96.3 fl (ref 78.0–100.0)
Platelets: 202 10*3/uL (ref 150.0–400.0)
RBC: 3.8 Mil/uL — ABNORMAL LOW (ref 3.87–5.11)
RDW: 13.5 % (ref 11.5–15.5)
WBC: 5.1 10*3/uL (ref 4.0–10.5)

## 2019-05-20 LAB — HEMOGLOBIN A1C: Hgb A1c MFr Bld: 6.3 % (ref 4.6–6.5)

## 2019-05-20 NOTE — Progress Notes (Signed)
Patient in today for Pneumovax per Dr. Charlett Blake.  Vaccine given and patient tolerated well.

## 2019-07-19 ENCOUNTER — Ambulatory Visit (INDEPENDENT_AMBULATORY_CARE_PROVIDER_SITE_OTHER): Payer: PPO

## 2019-07-19 DIAGNOSIS — Z23 Encounter for immunization: Secondary | ICD-10-CM | POA: Diagnosis not present

## 2019-09-16 ENCOUNTER — Other Ambulatory Visit: Payer: Self-pay

## 2019-11-23 ENCOUNTER — Other Ambulatory Visit: Payer: Self-pay | Admitting: Family Medicine

## 2019-11-26 DIAGNOSIS — Z1231 Encounter for screening mammogram for malignant neoplasm of breast: Secondary | ICD-10-CM | POA: Diagnosis not present

## 2019-11-26 LAB — HM MAMMOGRAPHY

## 2019-11-28 ENCOUNTER — Encounter: Payer: Self-pay | Admitting: Family Medicine

## 2019-12-28 NOTE — Progress Notes (Signed)
Cardiology Office Note:    Date:  12/29/2019   ID:  Wendy Munoz, DOB 07/12/1941, MRN [id number]  PCP:  Mosie Lukes, MD  Cardiologist:  Sinclair Grooms, MD   Referring MD: Mosie Lukes, MD   Chief Complaint  Patient presents with  . Congestive Heart Failure  . Atrial Fibrillation  . Advice Only    Anticoagulation/provision    History of Present Illness:    Wendy Munoz is a 79 y.o. female with a hx of paroxysmal atrial fibrillation, acute inferior infarct April 2016 treated with proximal RCA DES,residual 60-75% segmental proximal LAD stenosis,hypertension, prediabetes, and chronic diastolic heart failure.  She has not had chest discomfort.  She is physically active without limitations.  She denies lower extremity swelling.  No episodes of lightheadedness, palpitations, or syncope.  Inner aspect of the right thigh has ecchymosis.  Wonders if this could be related to Eliquis therapy.  Eliquis is also awfully expensive.  She is been afraid to take the vaccine.  She wanted to get our opinion.  Past Medical History:  Diagnosis Date  . Allergic state 06/26/2017  . Anemia 05/21/2017  . Arthritis   . Atrial tachycardia ([place])    a. asymptomatic. Noted on tele during 11/2014 admission   . CAD (coronary artery disease)    a. 11/2014 inf STEMI s/p DES to RCA; 50-70% ruptured plaque in pLAD- rx medically  . GERD (gastroesophageal reflux disease)   . HTN (hypertension)   . Hyperglycemia   . Hyperlipidemia   . Hypothyroidism   . Insomnia 05/27/2017  . Ischemic cardiomyopathy    a. 11/2014  LV gram with inferior hypokinesis. EF 45-50%.  . Nocturia 01/08/2017  . Obesity 06/26/2017  . Osteoporosis   . PAF (paroxysmal atrial fibrillation) ([place])    a. isolated episode of atrial fibrillation several years ago associated w/ her thyroid dysfunction (prior to ablation).   . Prediabetes   . Preventative health care 06/26/2017  . Vasomotor rhinitis 06/26/2017    Past Surgical History:    Procedure Laterality Date  . BREAST SURGERY     breast biospy  . CATARACT EXTRACTION Bilateral   . COLONOSCOPY    . EYE SURGERY Bilateral 2005, 2006   Cataract with lens ImplaNT   . INGUINAL HERNIA REPAIR Right   . LEFT HEART CATHETERIZATION WITH CORONARY ANGIOGRAM N/A 12/02/2014   Procedure: LEFT HEART CATHETERIZATION WITH CORONARY ANGIOGRAM;  Surgeon: Sinclair Grooms, MD;  Location: [facility] CATH LAB;  Service: Cardiovascular;  Laterality: N/A;  . ORIF HUMERUS FRACTURE Right 01/27/2014   Procedure: RIGHT OPEN REDUCTION INTERNAL FIXATION (ORIF) PROXIMAL HUMERUS FRACTURE;  Surgeon: Rozanna Box, MD;  Location: [facility];  Service: Orthopedics;  Laterality: Right;  . TOTAL KNEE ARTHROPLASTY Right 2006  . TOTAL KNEE ARTHROPLASTY Left 2011  . UMBILICAL HERNIA REPAIR     as a baby    Current Medications: Current Meds  Medication Sig  . acetaminophen (TYLENOL) 500 MG tablet Take 1-2 tablets (500-1,000 mg total) by mouth every 6 (six) hours as needed for moderate pain or fever.  Marland Kitchen acyclovir ointment (ZOVIRAX) 5 % Apply small amount to affected 5 x daily x 4 days and as needed cold sore  . apixaban (ELIQUIS) 5 MG TABS tablet Take 1 tablet (5 mg total) by mouth 2 (two) times daily.  . Calcium-Magnesium-Vitamin D (CALCIUM MAGNESIUM PO) Take 1 capsule by mouth daily.  . cetirizine (ZYRTEC) 10 MG tablet Take 10 mg by mouth  at bedtime.  . Cholecalciferol (VITAMIN D) 2000 UNITS tablet Take 4,000 Units by mouth daily.  . Coenzyme Q10 200 MG TABS Take 200 mg by mouth daily.  . hydrocortisone (ANUSOL-HC) 25 MG suppository Place 1 suppository (25 mg total) rectally at bedtime as needed for hemorrhoids or anal itching.  . levothyroxine (SYNTHROID) 88 MCG tablet TAKE 1 TABLET BY MOUTH EVERY DAY  . metoprolol tartrate (LOPRESSOR) 25 MG tablet TAKE 1 TABLET (25 MG TOTAL) BY MOUTH 2 (TWO) TIMES DAILY.  . nitroGLYCERIN (NITROSTAT) 0.4 MG SL tablet Place 1 tablet (0.4 mg total) under the tongue every 5 (five)  minutes as needed for chest pain.  Marland Kitchen nystatin cream (MYCOSTATIN) Apply 1 application topically 2 (two) times daily as needed for dry skin (rash on abdomen).  . rosuvastatin (CRESTOR) 40 MG tablet Take 0.5 tablets (20 mg total) by mouth daily.  . [DISCONTINUED] metoprolol tartrate (LOPRESSOR) 25 MG tablet TAKE 1 TABLET (25 MG TOTAL) BY MOUTH 2 (TWO) TIMES DAILY.  . [DISCONTINUED] nitroGLYCERIN (NITROSTAT) 0.4 MG SL tablet Place 1 tablet (0.4 mg total) under the tongue every 5 (five) minutes as needed for chest pain.     Allergies:   Keflex [cephalexin]   Social History   Socioeconomic History  . Marital status: Married    Spouse name: Not on file  . Number of children: Not on file  . Years of education: Not on file  . Highest education level: Not on file  Occupational History  . Occupation: retired   Tobacco Use  . Smoking status: Never Smoker  . Smokeless tobacco: Never Used  Substance and Sexual Activity  . Alcohol use: No    Alcohol/week: 0.0 standard drinks  . Drug use: No  . Sexual activity: Not on file  Other Topics Concern  . Not on file  Social History Narrative  . Not on file   Social Determinants of Health   Financial Resource Strain:   . Difficulty of Paying Living Expenses: Not on file  Food Insecurity:   . Worried About Charity fundraiser in the Last Year: Not on file  . Ran Out of Food in the Last Year: Not on file  Transportation Needs:   . Lack of Transportation (Medical): Not on file  . Lack of Transportation (Non-Medical): Not on file  Physical Activity:   . Days of Exercise per Week: Not on file  . Minutes of Exercise per Session: Not on file  Stress:   . Feeling of Stress : Not on file  Social Connections:   . Frequency of Communication with Friends and Family: Not on file  . Frequency of Social Gatherings with Friends and Family: Not on file  . Attends Religious Services: Not on file  . Active Member of Clubs or Organizations: Not on file  .  Attends Archivist Meetings: Not on file  . Marital Status: Not on file     Family History: The patient's family history includes Arthritis in her maternal aunt; Breast cancer in her maternal aunt and unknown relative; CAD in her mother; Diabetes in her paternal grandfather; Heart disease in her brother and mother; Heart failure in her mother; Hyperlipidemia in her sister; Hypertension in her brother and sister; Lung cancer in her paternal grandmother; Multiple myeloma in her father; Polycythemia in her father; Prostate cancer in her paternal grandfather; Stroke in her sister.  ROS:   Please see the history of present illness.    She has some swelling left  lower leg.  Has some varicose veins in the left lower extremity.  She is status post bilateral knee replacement.  She notices decreased memory for names and other things that she could once remember.  She is not back at her church which significantly limits her quality of life.  All other systems reviewed and are negative.  EKGs/Labs/Other Studies Reviewed:    The following studies were reviewed today: No new data  EKG:  EKG form today demonstrates sinus rhythm, PVCs, and otherwise unremarkable.  There is RSR prime in V1.  When compared to the electrocardiogram performed In May 2019, no significant change has occurred.  Recent Labs: 05/20/2019: ALT 13; BUN 18; Creatinine, Ser 1.23; Hemoglobin 12.2; Platelets 202.0; Potassium 4.0; Sodium 138; TSH 2.35  Recent Lipid Panel    Component Value Date/Time   CHOL 173 05/20/2019 1405   TRIG 193.0 (H) 05/20/2019 1405   HDL 82.10 05/20/2019 1405   CHOLHDL 2 05/20/2019 1405   VLDL 38.6 05/20/2019 1405   LDLCALC 52 05/20/2019 1405    Physical Exam:    VS:  BP 134/72   Pulse 76   Ht 5' 2" (1.575 m)   Wt 224 lb 3.2 oz (101.7 kg)   SpO2 97%   BMI 41.01 kg/m     Wt Readings from Last 3 Encounters:  12/29/19 224 lb 3.2 oz (101.7 kg)  02/12/19 215 lb (97.5 kg)  09/23/18 209 lb 6.4  oz (95 kg)     GEN: Morbid obesity. No acute distress HEENT: Normal NECK: No JVD. LYMPHATICS: No lymphadenopathy CARDIAC:  RRR without murmur, gallop, or edema. VASCULAR:  Normal Pulses. No bruits.  Ecchymosis inner aspect of right thigh. RESPIRATORY:  Clear to auscultation without rales, wheezing or rhonchi  ABDOMEN: Soft, non-tender, non-distended, No pulsatile mass, MUSCULOSKELETAL: No deformity  SKIN: Warm and dry NEUROLOGIC:  Alert and oriented x 3 PSYCHIATRIC:  Normal affect   ASSESSMENT:    1. Coronary artery disease involving native coronary artery of native heart without angina pectoris   2. PAF (paroxysmal atrial fibrillation) ([place])   3. Hyperlipidemia, unspecified hyperlipidemia type   4. Essential hypertension   5. Snoring   6. On apixaban therapy   7. Educated about COVID-19 virus infection    PLAN:    In order of problems listed above:  1. Secondary prevention discussed.  No symptoms to suggest angina. 2. No recurrent episodes of atrial fibrillation.  Continue Eliquis.  She does have some ecchymosis on the inner aspect of her right thigh.  No apparent trauma. 3. LDL target less than 70.  Most recent LDL was 52. 4. Blood pressure 130/80 mmHg or less. 5. Possible sleep apnea. 6. Would like a nonvitamin K inhibiting anticoagulant that is not as expensive as apixaban.  I encouraged her to look at her formulary to determine the cost of other anticoagulants. 7. Encouraged the COVID-19 vaccine.  Social distancing, handwashing, and mask wearing is recommended.  Overall education and awareness concerning primary/secondary risk prevention was discussed in detail: LDL less than 70, hemoglobin A1c less than 7, blood pressure target less than 130/80 mmHg, >150 minutes of moderate aerobic activity per week, avoidance of smoking, weight control (via diet and exercise), and continued surveillance/management of/for obstructive sleep apnea.    Medication Adjustments/Labs and  Tests Ordered: Current medicines are reviewed at length with the patient today.  Concerns regarding medicines are outlined above.  Orders Placed This Encounter  Procedures  . EKG 12-Lead   Meds ordered this  encounter  Medications  . metoprolol tartrate (LOPRESSOR) 25 MG tablet    Sig: TAKE 1 TABLET (25 MG TOTAL) BY MOUTH 2 (TWO) TIMES DAILY.    Dispense:  180 tablet    Refill:  3  . nitroGLYCERIN (NITROSTAT) 0.4 MG SL tablet    Sig: Place 1 tablet (0.4 mg total) under the tongue every 5 (five) minutes as needed for chest pain.    Dispense:  25 tablet    Refill:  6    Patient Instructions  Medication Instructions:  Your physician recommends that you continue on your current medications as directed. Please refer to the Current Medication list given to you today.  *If you need a refill on your cardiac medications before your next appointment, please call your pharmacy*   Lab Work: None If you have labs (blood work) drawn today and your tests are completely normal, you will receive your results only by: Marland Kitchen MyChart Message (if you have MyChart) OR . A paper copy in the mail If you have any lab test that is abnormal or we need to change your treatment, we will call you to review the results.   Testing/Procedures: None   Follow-Up: At [facility], you and your health needs are our priority.  As part of our continuing mission to provide you with exceptional heart care, we have created designated Provider Care Teams.  These Care Teams include your primary Cardiologist (physician) and Advanced Practice Providers (APPs -  Physician Assistants and Nurse Practitioners) who all work together to provide you with the care you need, when you need it.  We recommend signing up for the patient portal called "MyChart".  Sign up information is provided on this After Visit Summary.  MyChart is used to connect with patients for Virtual Visits (Telemedicine).  Patients are able to view lab/test  results, encounter notes, upcoming appointments, etc.  Non-urgent messages can be sent to your provider as well.   To learn more about what you can do with MyChart, go to [email or web address].    Your next appointment:   12 month(s)  The format for your next appointment:   In Person  Provider:   You may see Sinclair Grooms, MD or one of the following Advanced Practice Providers on your designated Care Team:    Truitt Merle, NP  Cecilie Kicks, NP  Kathyrn Drown, NP    Other Instructions      Signed, Sinclair Grooms, MD  12/29/2019 2:38 PM    [place]

## 2019-12-29 ENCOUNTER — Other Ambulatory Visit: Payer: Self-pay

## 2019-12-29 ENCOUNTER — Ambulatory Visit: Payer: PPO | Admitting: Interventional Cardiology

## 2019-12-29 ENCOUNTER — Encounter: Payer: Self-pay | Admitting: Interventional Cardiology

## 2019-12-29 VITALS — BP 134/72 | HR 76 | Ht 62.0 in | Wt 224.2 lb

## 2019-12-29 DIAGNOSIS — Z7901 Long term (current) use of anticoagulants: Secondary | ICD-10-CM | POA: Diagnosis not present

## 2019-12-29 DIAGNOSIS — Z7189 Other specified counseling: Secondary | ICD-10-CM

## 2019-12-29 DIAGNOSIS — I480 Paroxysmal atrial fibrillation: Secondary | ICD-10-CM

## 2019-12-29 DIAGNOSIS — I10 Essential (primary) hypertension: Secondary | ICD-10-CM

## 2019-12-29 DIAGNOSIS — I2510 Atherosclerotic heart disease of native coronary artery without angina pectoris: Secondary | ICD-10-CM

## 2019-12-29 DIAGNOSIS — R0683 Snoring: Secondary | ICD-10-CM | POA: Diagnosis not present

## 2019-12-29 DIAGNOSIS — E785 Hyperlipidemia, unspecified: Secondary | ICD-10-CM | POA: Diagnosis not present

## 2019-12-29 MED ORDER — METOPROLOL TARTRATE 25 MG PO TABS: ORAL_TABLET | ORAL | 3 refills | Status: DC

## 2019-12-29 MED ORDER — NITROGLYCERIN 0.4 MG SL SUBL: 0.4000 mg | SUBLINGUAL_TABLET | SUBLINGUAL | 6 refills | Status: DC | PRN

## 2019-12-29 NOTE — Patient Instructions (Signed)
Medication Instructions:  Your physician recommends that you continue on your current medications as directed. Please refer to the Current Medication list given to you today.  *If you need a refill on your cardiac medications before your next appointment, please call your pharmacy*   Lab Work: None If you have labs (blood work) drawn today and your tests are completely normal, you will receive your results only by: . MyChart Message (if you have MyChart) OR . A paper copy in the mail If you have any lab test that is abnormal or we need to change your treatment, we will call you to review the results.   Testing/Procedures: None   Follow-Up: At [facility], you and your health needs are our priority.  As part of our continuing mission to provide you with exceptional heart care, we have created designated Provider Care Teams.  These Care Teams include your primary Cardiologist (physician) and Advanced Practice Providers (APPs -  Physician Assistants and Nurse Practitioners) who all work together to provide you with the care you need, when you need it.  We recommend signing up for the patient portal called "MyChart".  Sign up information is provided on this After Visit Summary.  MyChart is used to connect with patients for Virtual Visits (Telemedicine).  Patients are able to view lab/test results, encounter notes, upcoming appointments, etc.  Non-urgent messages can be sent to your provider as well.   To learn more about what you can do with MyChart, go to [email or web address].    Your next appointment:   12 month(s)  The format for your next appointment:   In Person  Provider:   You may see Henry W Smith III, MD or one of the following Advanced Practice Providers on your designated Care Team:    Lori Gerhardt, NP  Regena Ingold, NP  Jill McDaniel, NP    Other Instructions   

## 2020-04-14 ENCOUNTER — Other Ambulatory Visit: Payer: Self-pay | Admitting: Interventional Cardiology

## 2020-04-14 DIAGNOSIS — R0683 Snoring: Secondary | ICD-10-CM

## 2020-04-14 DIAGNOSIS — I480 Paroxysmal atrial fibrillation: Secondary | ICD-10-CM

## 2020-04-14 DIAGNOSIS — I10 Essential (primary) hypertension: Secondary | ICD-10-CM

## 2020-04-14 DIAGNOSIS — I2510 Atherosclerotic heart disease of native coronary artery without angina pectoris: Secondary | ICD-10-CM

## 2020-04-14 NOTE — Telephone Encounter (Signed)
Pt last saw Dr Tamala Julian 12/29/19, last labs 05/20/19 Creat 1.23, age 79, weight 101.7kg, based on specified criteria pt is on appropriate dosage of Eliquis 5mg  BID.  Will refill rx.

## 2020-04-22 ENCOUNTER — Encounter: Payer: PPO | Admitting: Family Medicine

## 2020-04-24 ENCOUNTER — Other Ambulatory Visit: Payer: Self-pay | Admitting: Family Medicine

## 2020-05-18 ENCOUNTER — Other Ambulatory Visit: Payer: Self-pay | Admitting: Family Medicine

## 2020-05-18 ENCOUNTER — Encounter: Payer: Self-pay | Admitting: Family Medicine

## 2020-05-18 ENCOUNTER — Other Ambulatory Visit: Payer: Self-pay

## 2020-05-18 MED ORDER — LEVOTHYROXINE SODIUM 88 MCG PO TABS: 88.0000 ug | ORAL_TABLET | Freq: Every day | ORAL | 0 refills | Status: DC

## 2020-05-24 ENCOUNTER — Encounter: Payer: PPO | Admitting: Family Medicine

## 2020-06-03 ENCOUNTER — Other Ambulatory Visit: Payer: Self-pay

## 2020-06-03 ENCOUNTER — Encounter: Payer: Self-pay | Admitting: Family Medicine

## 2020-06-03 ENCOUNTER — Ambulatory Visit (INDEPENDENT_AMBULATORY_CARE_PROVIDER_SITE_OTHER): Payer: PPO | Admitting: Family Medicine

## 2020-06-03 VITALS — BP 116/74 | HR 71 | Temp 97.4°F | Resp 18 | Ht 62.0 in | Wt 215.8 lb

## 2020-06-03 DIAGNOSIS — M810 Age-related osteoporosis without current pathological fracture: Secondary | ICD-10-CM | POA: Diagnosis not present

## 2020-06-03 DIAGNOSIS — E2839 Other primary ovarian failure: Secondary | ICD-10-CM | POA: Diagnosis not present

## 2020-06-03 DIAGNOSIS — I480 Paroxysmal atrial fibrillation: Secondary | ICD-10-CM

## 2020-06-03 DIAGNOSIS — E785 Hyperlipidemia, unspecified: Secondary | ICD-10-CM

## 2020-06-03 DIAGNOSIS — Z0000 Encounter for general adult medical examination without abnormal findings: Secondary | ICD-10-CM

## 2020-06-03 DIAGNOSIS — E039 Hypothyroidism, unspecified: Secondary | ICD-10-CM | POA: Diagnosis not present

## 2020-06-03 NOTE — Patient Instructions (Signed)
Preventive Care 79 Years and Older, Female Preventive care refers to lifestyle choices and visits with your health care provider that can promote health and wellness. This includes:  A yearly physical exam. This is also called an annual well check.  Regular dental and eye exams.  Immunizations.  Screening for certain conditions.  Healthy lifestyle choices, such as diet and exercise. What can I expect for my preventive care visit? Physical exam Your health care provider will check:  Height and weight. These may be used to calculate body mass index (BMI), which is a measurement that tells if you are at a healthy weight.  Heart rate and blood pressure.  Your skin for abnormal spots. Counseling Your health care provider may ask you questions about:  Alcohol, tobacco, and drug use.  Emotional well-being.  Home and relationship well-being.  Sexual activity.  Eating habits.  History of falls.  Memory and ability to understand (cognition).  Work and work Statistician.  Pregnancy and menstrual history. What immunizations do I need?  Influenza (flu) vaccine  This is recommended every year. Tetanus, diphtheria, and pertussis (Tdap) vaccine  You may need a Td booster every 10 years. Varicella (chickenpox) vaccine  You may need this vaccine if you have not already been vaccinated. Zoster (shingles) vaccine  You may need this after age 79. Pneumococcal conjugate (PCV13) vaccine  One dose is recommended after age 79. Pneumococcal polysaccharide (PPSV23) vaccine  One dose is recommended after age 79. Measles, mumps, and rubella (MMR) vaccine  You may need at least one dose of MMR if you were born in 1957 or later. You may also need a second dose. Meningococcal conjugate (MenACWY) vaccine  You may need this if you have certain conditions. Hepatitis A vaccine  You may need this if you have certain conditions or if you travel or work in places where you may be exposed  to hepatitis A. Hepatitis B vaccine  You may need this if you have certain conditions or if you travel or work in places where you may be exposed to hepatitis B. Haemophilus influenzae type b (Hib) vaccine  You may need this if you have certain conditions. You may receive vaccines as individual doses or as more than one vaccine together in one shot (combination vaccines). Talk with your health care provider about the risks and benefits of combination vaccines. What tests do I need? Blood tests  Lipid and cholesterol levels. These may be checked every 5 years, or more frequently depending on your overall health.  Hepatitis C test.  Hepatitis B test. Screening  Lung cancer screening. You may have this screening every year starting at age 79 if you have a 30-pack-year history of smoking and currently smoke or have quit within the past 15 years.  Colorectal cancer screening. All adults should have this screening starting at age 79 and continuing until age 15. Your health care provider may recommend screening at age 79 if you are at increased risk. You will have tests every 1-10 years, depending on your results and the type of screening test.  Diabetes screening. This is done by checking your blood sugar (glucose) after you have not eaten for a while (fasting). You may have this done every 1-3 years.  Mammogram. This may be done every 1-2 years. Talk with your health care provider about how often you should have regular mammograms.  BRCA-related cancer screening. This may be done if you have a family history of breast, ovarian, tubal, or peritoneal cancers.  Other tests  Sexually transmitted disease (STD) testing.  Bone density scan. This is done to screen for osteoporosis. You may have this done starting at age 79. Follow these instructions at home: Eating and drinking  Eat a diet that includes fresh fruits and vegetables, whole grains, lean protein, and low-fat dairy products. Limit  your intake of foods with high amounts of sugar, saturated fats, and salt.  Take vitamin and mineral supplements as recommended by your health care provider.  Do not drink alcohol if your health care provider tells you not to drink.  If you drink alcohol: ? Limit how much you have to 0-1 drink a day. ? Be aware of how much alcohol is in your drink. In the [place] one drink equals one 12 oz bottle of beer (355 mL), one 5 oz glass of wine (148 mL), or one 1 oz glass of hard liquor (44 mL). Lifestyle  Take daily care of your teeth and gums.  Stay active. Exercise for at least 30 minutes on 5 or more days each week.  Do not use any products that contain nicotine or tobacco, such as cigarettes, e-cigarettes, and chewing tobacco. If you need help quitting, ask your health care provider.  If you are sexually active, practice safe sex. Use a condom or other form of protection in order to prevent STIs (sexually transmitted infections).  Talk with your health care provider about taking a low-dose aspirin or statin. What's next?  Go to your health care provider once a year for a well check visit.  Ask your health care provider how often you should have your eyes and teeth checked.  Stay up to date on all vaccines. This information is not intended to replace advice given to you by your health care provider. Make sure you discuss any questions you have with your health care provider. Document Revised: 10/03/2018 Document Reviewed: 10/03/2018 Elsevier Patient Education  2020 [employer].

## 2020-06-03 NOTE — Progress Notes (Signed)
Subjective:     Wendy Munoz is a 79 y.o. female and is here for a comprehensive physical exam. The patient reports no problems. She needs labs to f/u thyroid   Social History   Socioeconomic History  . Marital status: Married    Spouse name: Not on file  . Number of children: Not on file  . Years of education: Not on file  . Highest education level: Not on file  Occupational History  . Occupation: retired   Tobacco Use  . Smoking status: Never Smoker  . Smokeless tobacco: Never Used  Vaping Use  . Vaping Use: Never used  Substance and Sexual Activity  . Alcohol use: No    Alcohol/week: 0.0 standard drinks  . Drug use: No  . Sexual activity: Yes    Partners: Male  Other Topics Concern  . Not on file  Social History Narrative  . Not on file   Social Determinants of Health   Financial Resource Strain:   . Difficulty of Paying Living Expenses:   Food Insecurity:   . Worried About Charity fundraiser in the Last Year:   . Arboriculturist in the Last Year:   Transportation Needs:   . Film/video editor (Medical):   Marland Kitchen Lack of Transportation (Non-Medical):   Physical Activity:   . Days of Exercise per Week:   . Minutes of Exercise per Session:   Stress:   . Feeling of Stress :   Social Connections:   . Frequency of Communication with Friends and Family:   . Frequency of Social Gatherings with Friends and Family:   . Attends Religious Services:   . Active Member of Clubs or Organizations:   . Attends Archivist Meetings:   Marland Kitchen Marital Status:   Intimate Partner Violence:   . Fear of Current or Ex-Partner:   . Emotionally Abused:   Marland Kitchen Physically Abused:   . Sexually Abused:    Health Maintenance  Topic Date Due  . Hepatitis C Screening  Never done  . COVID-19 Vaccine (1) Never done  . TETANUS/TDAP  10/24/2019  . INFLUENZA VACCINE  05/23/2020  . DEXA SCAN  Completed  . PNA vac Low Risk Adult  Completed    The following portions of the patient's  history were reviewed and updated as appropriate:  She  has a past medical history of Allergic state (06/26/2017), Anemia (05/21/2017), Arthritis, Atrial tachycardia ([place]), CAD (coronary artery disease), GERD (gastroesophageal reflux disease), HTN (hypertension), Hyperglycemia, Hyperlipidemia, Hypothyroidism, Insomnia (05/27/2017), Ischemic cardiomyopathy, Nocturia (01/08/2017), Obesity (06/26/2017), Osteoporosis, PAF (paroxysmal atrial fibrillation) ([place]), Prediabetes, Preventative health care (06/26/2017), and Vasomotor rhinitis (06/26/2017). She does not have any pertinent problems on file. She  has a past surgical history that includes Inguinal hernia repair (Right); Umbilical hernia repair; Total knee arthroplasty (Right, 2006); Total knee arthroplasty (Left, 2011); Cataract extraction (Bilateral); Colonoscopy; Breast surgery; Eye surgery (Bilateral, 2005, 2006); ORIF humerus fracture (Right, 01/27/2014); and left heart catheterization with coronary angiogram (N/A, 12/02/2014). Her family history includes Arthritis in her maternal aunt; Breast cancer in her maternal aunt and another family member; Breast cancer (age of onset: 61) in her sister; CAD in her mother; Diabetes in her paternal grandfather; Heart disease in her brother and mother; Heart failure in her mother; Hyperlipidemia in her sister; Hypertension in her brother and sister; Lung cancer in her paternal grandmother; Multiple myeloma in her father; Polycythemia in her father; Prostate cancer in her paternal grandfather; Stroke in her sister.  She  reports that she has never smoked. She has never used smokeless tobacco. She reports that she does not drink alcohol and does not use drugs. She has a current medication list which includes the following prescription(s): acetaminophen, acyclovir ointment, calcium-magnesium-vitamin d, cetirizine, vitamin d, coenzyme q10, eliquis, hydrocortisone, levothyroxine, metoprolol tartrate, nitroglycerin, nystatin cream, and  rosuvastatin. Current Outpatient Medications on File Prior to Visit  Medication Sig Dispense Refill  . acetaminophen (TYLENOL) 500 MG tablet Take 1-2 tablets (500-1,000 mg total) by mouth every 6 (six) hours as needed for moderate pain or fever. 90 tablet 0  . acyclovir ointment (ZOVIRAX) 5 % Apply small amount to affected 5 x daily x 4 days and as needed cold sore 30 g 1  . Calcium-Magnesium-Vitamin D (CALCIUM MAGNESIUM PO) Take 1 capsule by mouth daily.    . cetirizine (ZYRTEC) 10 MG tablet Take 10 mg by mouth at bedtime.    . Cholecalciferol (VITAMIN D) 2000 UNITS tablet Take 4,000 Units by mouth daily.    . Coenzyme Q10 200 MG TABS Take 200 mg by mouth daily.    Marland Kitchen ELIQUIS 5 MG TABS tablet TAKE 1 TABLET BY MOUTH TWICE A DAY 180 tablet 1  . hydrocortisone (ANUSOL-HC) 25 MG suppository Place 1 suppository (25 mg total) rectally at bedtime as needed for hemorrhoids or anal itching. 12 suppository 1  . levothyroxine (SYNTHROID) 88 MCG tablet Take 1 tablet (88 mcg total) by mouth daily. 30 tablet 0  . metoprolol tartrate (LOPRESSOR) 25 MG tablet TAKE 1 TABLET (25 MG TOTAL) BY MOUTH 2 (TWO) TIMES DAILY. 180 tablet 3  . nitroGLYCERIN (NITROSTAT) 0.4 MG SL tablet Place 1 tablet (0.4 mg total) under the tongue every 5 (five) minutes as needed for chest pain. 25 tablet 6  . nystatin cream (MYCOSTATIN) Apply 1 application topically 2 (two) times daily as needed for dry skin (rash on abdomen). 30 g 0  . rosuvastatin (CRESTOR) 40 MG tablet Take 0.5 tablets (20 mg total) by mouth daily. 45 tablet 0   No current facility-administered medications on file prior to visit.   She is allergic to keflex [cephalexin]..  Review of Systems Review of Systems  Constitutional: Negative for activity change, appetite change and fatigue.  HENT: Negative for hearing loss, congestion, tinnitus and ear discharge.  dentist q22mEyes: Negative for visual disturbance (see optho q1y -- vision corrected to 20/20 with glasses).   Respiratory: Negative for cough, chest tightness and shortness of breath.   Cardiovascular: Negative for chest pain, palpitations and leg swelling.  Gastrointestinal: Negative for abdominal pain, diarrhea, constipation and abdominal distention.  Genitourinary: Negative for urgency, frequency, decreased urine volume and difficulty urinating.  Musculoskeletal: Negative for back pain, arthralgias and gait problem.  Skin: Negative for color change, pallor and rash.  Neurological: Negative for dizziness, light-headedness, numbness and headaches.  Hematological: Negative for adenopathy. Does not bruise/bleed easily.  Psychiatric/Behavioral: Negative for suicidal ideas, confusion, sleep disturbance, self-injury, dysphoric mood, decreased concentration and agitation.       Objective:    BP 116/74 (BP Location: Right Arm, Patient Position: Sitting, Cuff Size: Large)   Pulse 71   Temp (!) 97.4 F (36.3 C) (Oral)   Resp 18   Ht 5' 2"  (1.575 m)   Wt 215 lb 12.8 oz (97.9 kg)   SpO2 96%   BMI 39.47 kg/m  General appearance: alert, cooperative, appears stated age and no distress Head: Normocephalic, without obvious abnormality, atraumatic Eyes: negative findings: lids and lashes normal, conjunctivae  and sclerae normal and pupils equal, round, reactive to light and accomodation Ears: normal TM's and external ear canals both ears Neck: no adenopathy, no carotid bruit, no JVD, supple, symmetrical, trachea midline and thyroid not enlarged, symmetric, no tenderness/mass/nodules Back: negative Lungs: clear to auscultation bilaterally Breasts: deferred --- pt not in gown  Heart: regular rate and rhythm, S1, S2 normal, no murmur, click, rub or gallop Abdomen: soft, non-tender; bowel sounds normal; no masses,  no organomegaly Pelvic: not indicated; post-menopausal, no abnormal Pap smears in past Extremities: extremities normal, atraumatic, no cyanosis or edema Pulses: 2+ and symmetric Skin: Skin  color, texture, turgor normal. No rashes or lesions Lymph nodes: Cervical, supraclavicular, and axillary nodes normal. Neurologic: Alert and oriented X 3, normal strength and tone. Normal symmetric reflexes. Normal coordination and gait    Assessment:    Healthy female exam.      Plan:    ghm utd Check labs   See After Visit Summary for Counseling Recommendations    1. Hypothyroidism, unspecified type Check labs today con't meds  - TSH  2. Dyslipidemia Encouraged heart healthy diet, increase exercise, avoid trans fats, consider a krill oil cap daily - Lipid panel - Comprehensive metabolic panel  3. Estrogen deficiency   - DG Bone Density; Future

## 2020-06-04 LAB — LIPID PANEL
Cholesterol: 177 mg/dL (ref 0–200)
HDL: 77.6 mg/dL (ref >39.00)
LDL Cholesterol: 74 mg/dL (ref 0–99)
NonHDL: 99.5
Total CHOL/HDL Ratio: 2
Triglycerides: 129 mg/dL (ref 0.0–149.0)
VLDL: 25.8 mg/dL (ref 0.0–40.0)

## 2020-06-04 LAB — COMPREHENSIVE METABOLIC PANEL
ALT: 15 U/L (ref 0–35)
AST: 23 U/L (ref 0–37)
Albumin: 4.2 g/dL (ref 3.5–5.2)
Alkaline Phosphatase: 62 U/L (ref 39–117)
BUN: 17 mg/dL (ref 6–23)
CO2: 27 mEq/L (ref 19–32)
Calcium: 9.6 mg/dL (ref 8.4–10.5)
Chloride: 103 mEq/L (ref 96–112)
Creatinine, Ser: 1.2 mg/dL (ref 0.40–1.20)
GFR: 43.37 mL/min — ABNORMAL LOW (ref >60.00)
Glucose, Bld: 93 mg/dL (ref 70–99)
Potassium: 4.3 mEq/L (ref 3.5–5.1)
Sodium: 138 mEq/L (ref 135–145)
Total Bilirubin: 0.7 mg/dL (ref 0.2–1.2)
Total Protein: 7.1 g/dL (ref 6.0–8.3)

## 2020-06-04 LAB — TSH: TSH: 3.54 u[IU]/mL (ref 0.35–4.50)

## 2020-06-07 ENCOUNTER — Encounter: Payer: Self-pay | Admitting: Family Medicine

## 2020-06-07 NOTE — Assessment & Plan Note (Signed)
bmd ordered

## 2020-06-07 NOTE — Assessment & Plan Note (Signed)
On eliquis  °Per cardiology °

## 2020-07-08 ENCOUNTER — Other Ambulatory Visit: Payer: Self-pay | Admitting: Family Medicine

## 2020-08-03 ENCOUNTER — Other Ambulatory Visit: Payer: Self-pay

## 2020-08-03 ENCOUNTER — Ambulatory Visit (INDEPENDENT_AMBULATORY_CARE_PROVIDER_SITE_OTHER): Payer: PPO | Admitting: *Deleted

## 2020-08-03 DIAGNOSIS — Z23 Encounter for immunization: Secondary | ICD-10-CM

## 2020-08-03 NOTE — Progress Notes (Signed)
Patient here for high dose flu vaccine.  Vaccine given in left deltoid and patient tolerated well.  

## 2020-09-10 ENCOUNTER — Telehealth: Payer: Self-pay | Admitting: Interventional Cardiology

## 2020-09-10 NOTE — Telephone Encounter (Signed)
Spoke with Wendy Munoz and made her aware that we do not encourage people to take other people's medications.  Advised Albuterol can also aggravate her AF.  Asked if we have ever filled out a handicap placard for her before.  Wendy Munoz states she couldn't remember if Dr. Tamala Julian did or her ortho doctor.  Advised it would be best to have ortho fill it out as I don't see a cardiac reason for one.  Wendy Munoz verbalized understanding and was appreciative for call.

## 2020-09-10 NOTE — Telephone Encounter (Signed)
Patient states she has a cold and would like to know if it is safe for her to use her husbands nebulizer for it. She also states she has to get her handicap placard renewed and would like to know if she can mail it to the office.

## 2020-09-29 ENCOUNTER — Other Ambulatory Visit: Payer: Self-pay | Admitting: Interventional Cardiology

## 2020-09-29 ENCOUNTER — Other Ambulatory Visit: Payer: Self-pay | Admitting: Family Medicine

## 2020-09-29 DIAGNOSIS — I10 Essential (primary) hypertension: Secondary | ICD-10-CM

## 2020-09-29 DIAGNOSIS — R0683 Snoring: Secondary | ICD-10-CM

## 2020-09-29 DIAGNOSIS — I480 Paroxysmal atrial fibrillation: Secondary | ICD-10-CM

## 2020-09-29 DIAGNOSIS — I2510 Atherosclerotic heart disease of native coronary artery without angina pectoris: Secondary | ICD-10-CM

## 2020-09-30 NOTE — Telephone Encounter (Signed)
Eliquis 5mg  refill request received. Patient is 79 years old, weight-97.9kg, Crea-1.20 on 06/03/2020, Diagnosis-Afib, and last seen by Dr. Tamala Julian on 3/8/202and pending appt on 01/06/2021. Dose is appropriate based on dosing criteria. Will send in refill to requested pharmacy.

## 2020-11-29 ENCOUNTER — Ambulatory Visit (INDEPENDENT_AMBULATORY_CARE_PROVIDER_SITE_OTHER): Payer: PPO | Admitting: Family Medicine

## 2020-11-29 ENCOUNTER — Other Ambulatory Visit: Payer: Self-pay

## 2020-11-29 DIAGNOSIS — E039 Hypothyroidism, unspecified: Secondary | ICD-10-CM

## 2020-11-29 DIAGNOSIS — R739 Hyperglycemia, unspecified: Secondary | ICD-10-CM

## 2020-11-29 DIAGNOSIS — M810 Age-related osteoporosis without current pathological fracture: Secondary | ICD-10-CM | POA: Diagnosis not present

## 2020-11-29 DIAGNOSIS — B354 Tinea corporis: Secondary | ICD-10-CM | POA: Insufficient documentation

## 2020-11-29 DIAGNOSIS — I10 Essential (primary) hypertension: Secondary | ICD-10-CM

## 2020-11-29 DIAGNOSIS — E785 Hyperlipidemia, unspecified: Secondary | ICD-10-CM

## 2020-11-29 MED ORDER — NYSTATIN 100000 UNIT/GM EX CREA: 1.0000 "application " | TOPICAL_CREAM | Freq: Two times a day (BID) | CUTANEOUS | 2 refills | Status: DC | PRN

## 2020-11-29 NOTE — Assessment & Plan Note (Signed)
hgba1c acceptable, minimize simple carbs. Increase exercise as tolerated.  

## 2020-11-29 NOTE — Assessment & Plan Note (Signed)
Encouraged heart healthy diet, increase exercise, avoid trans fats, consider a krill oil cap daily, tolerating Rosuvastatin 

## 2020-11-29 NOTE — Progress Notes (Signed)
Subjective:    Patient ID: Wendy Munoz, female    DOB: [birth year], 80 y.o.   MRN: [id number]  Chief Complaint  Patient presents with  . Follow-up    HPI Patient is in today for follow up on chronic medical concerns. No recent febrile illness or hospitalizations.she is feeling well today. She has a rash on her skin between her legs and in axillae. Not itchy present for months. She has still chosen not to have the COVID shots. Denies CP/palp/SOB/HA/congestion/fevers/GI or GU c/o. Taking meds as prescribed  Past Medical History:  Diagnosis Date  . Allergic state 06/26/2017  . Anemia 05/21/2017  . Arthritis   . Atrial tachycardia ([place])    a. asymptomatic. Noted on tele during 11/2014 admission   . CAD (coronary artery disease)    a. 11/2014 inf STEMI s/p DES to RCA; 50-70% ruptured plaque in pLAD- rx medically  . GERD (gastroesophageal reflux disease)   . HTN (hypertension)   . Hyperglycemia   . Hyperlipidemia   . Hypothyroidism   . Insomnia 05/27/2017  . Ischemic cardiomyopathy    a. 11/2014  LV gram with inferior hypokinesis. EF 45-50%.  . Nocturia 01/08/2017  . Obesity 06/26/2017  . Osteoporosis   . PAF (paroxysmal atrial fibrillation) ([place])    a. isolated episode of atrial fibrillation several years ago associated w/ her thyroid dysfunction (prior to ablation).   . Prediabetes   . Preventative health care 06/26/2017  . Vasomotor rhinitis 06/26/2017    Past Surgical History:  Procedure Laterality Date  . BREAST SURGERY     breast biospy  . CATARACT EXTRACTION Bilateral   . COLONOSCOPY    . EYE SURGERY Bilateral 2005, 2006   Cataract with lens ImplaNT   . INGUINAL HERNIA REPAIR Right   . LEFT HEART CATHETERIZATION WITH CORONARY ANGIOGRAM N/A 12/02/2014   Procedure: LEFT HEART CATHETERIZATION WITH CORONARY ANGIOGRAM;  Surgeon: Sinclair Grooms, MD;  Location: [facility] CATH LAB;  Service: Cardiovascular;  Laterality: N/A;  . ORIF HUMERUS FRACTURE Right 01/27/2014   Procedure: RIGHT  OPEN REDUCTION INTERNAL FIXATION (ORIF) PROXIMAL HUMERUS FRACTURE;  Surgeon: Rozanna Box, MD;  Location: [facility];  Service: Orthopedics;  Laterality: Right;  . TOTAL KNEE ARTHROPLASTY Right 2006  . TOTAL KNEE ARTHROPLASTY Left 2011  . UMBILICAL HERNIA REPAIR     as a baby    Family History  Problem Relation Age of Onset  . CAD Mother   . Heart disease Mother   . Heart failure Mother   . Polycythemia Father   . Multiple myeloma Father   . Prostate cancer Paternal Grandfather   . Diabetes Paternal Grandfather   . Lung cancer Paternal Grandmother   . Breast cancer Other   . Arthritis Maternal Aunt   . Breast cancer Maternal Aunt   . Stroke Sister   . Hyperlipidemia Sister   . Hypertension Sister   . Breast cancer Sister 2  . Heart disease Brother        bradycardia  . Hypertension Brother     Social History   Socioeconomic History  . Marital status: Married    Spouse name: Not on file  . Number of children: Not on file  . Years of education: Not on file  . Highest education level: Not on file  Occupational History  . Occupation: retired   Tobacco Use  . Smoking status: Never Smoker  . Smokeless tobacco: Never Used  Vaping Use  . Vaping Use: Never  used  Substance and Sexual Activity  . Alcohol use: No    Alcohol/week: 0.0 standard drinks  . Drug use: No  . Sexual activity: Yes    Partners: Male  Other Topics Concern  . Not on file  Social History Narrative  . Not on file   Social Determinants of Health   Financial Resource Strain: Not on file  Food Insecurity: Not on file  Transportation Needs: Not on file  Physical Activity: Not on file  Stress: Not on file  Social Connections: Not on file  Intimate Partner Violence: Not on file    Outpatient Medications Prior to Visit  Medication Sig Dispense Refill  . acetaminophen (TYLENOL) 500 MG tablet Take 1-2 tablets (500-1,000 mg total) by mouth every 6 (six) hours as needed for moderate pain or fever. 90  tablet 0  . acyclovir ointment (ZOVIRAX) 5 % Apply small amount to affected 5 x daily x 4 days and as needed cold sore 30 g 1  . Calcium-Magnesium-Vitamin D (CALCIUM MAGNESIUM PO) Take 1 capsule by mouth daily.    Marland Kitchen levothyroxine (SYNTHROID) 88 MCG tablet TAKE 1 TABLET BY MOUTH EVERY DAY 90 tablet 1  . metoprolol tartrate (LOPRESSOR) 25 MG tablet TAKE 1 TABLET (25 MG TOTAL) BY MOUTH 2 (TWO) TIMES DAILY. 180 tablet 3  . nitroGLYCERIN (NITROSTAT) 0.4 MG SL tablet Place 1 tablet (0.4 mg total) under the tongue every 5 (five) minutes as needed for chest pain. 25 tablet 6  . rosuvastatin (CRESTOR) 40 MG tablet Take 0.5 tablets (20 mg total) by mouth daily. 45 tablet 1  . nystatin cream (MYCOSTATIN) Apply 1 application topically 2 (two) times daily as needed for dry skin (rash on abdomen). 30 g 0  . cetirizine (ZYRTEC) 10 MG tablet Take 10 mg by mouth at bedtime.    . Cholecalciferol (VITAMIN D) 2000 UNITS tablet Take 4,000 Units by mouth daily.    . Coenzyme Q10 200 MG TABS Take 200 mg by mouth daily.    Marland Kitchen ELIQUIS 5 MG TABS tablet TAKE 1 TABLET BY MOUTH TWICE A DAY 180 tablet 1  . hydrocortisone (ANUSOL-HC) 25 MG suppository Place 1 suppository (25 mg total) rectally at bedtime as needed for hemorrhoids or anal itching. 12 suppository 1   No facility-administered medications prior to visit.    Allergies  Allergen Reactions  . Keflex [Cephalexin] Rash    Review of Systems  Constitutional: Negative for chills, fever and malaise/fatigue.  HENT: Negative for congestion and hearing loss.   Eyes: Negative for discharge.  Respiratory: Negative for cough, sputum production and shortness of breath.   Cardiovascular: Negative for chest pain, palpitations and leg swelling.  Gastrointestinal: Negative for abdominal pain, blood in stool, constipation, diarrhea, heartburn, nausea and vomiting.  Genitourinary: Negative for dysuria, frequency, hematuria and urgency.  Musculoskeletal: Negative for back pain,  falls and myalgias.  Skin: Positive for rash. Negative for itching.  Neurological: Negative for dizziness, sensory change, loss of consciousness, weakness and headaches.  Endo/Heme/Allergies: Negative for environmental allergies. Does not bruise/bleed easily.  Psychiatric/Behavioral: Negative for depression and suicidal ideas. The patient is not nervous/anxious and does not have insomnia.        Objective:    Physical Exam Vitals and nursing note reviewed.  Constitutional:      General: She is not in acute distress.    Appearance: She is well-developed and well-nourished.  HENT:     Head: Normocephalic and atraumatic.     Nose: Nose normal.  Eyes:     General:        Right eye: No discharge.        Left eye: No discharge.  Cardiovascular:     Rate and Rhythm: Normal rate and regular rhythm.  Pulmonary:     Effort: Pulmonary effort is normal.     Breath sounds: Normal breath sounds.  Abdominal:     General: Bowel sounds are normal.     Palpations: Abdomen is soft.     Tenderness: There is no abdominal tenderness.  Musculoskeletal:        General: No edema.     Cervical back: Normal range of motion and neck supple.  Skin:    General: Skin is warm and dry.     Findings: Rash present.     Comments: Erythematous patches in grown, between thighs  Neurological:     Mental Status: She is alert and oriented to person, place, and time.  Psychiatric:        Mood and Affect: Mood and affect normal.     There were no vitals taken for this visit. Wt Readings from Last 3 Encounters:  06/03/20 215 lb 12.8 oz (97.9 kg)  12/29/19 224 lb 3.2 oz (101.7 kg)  02/12/19 215 lb (97.5 kg)    Diabetic Foot Exam - Simple   No data filed    Lab Results  Component Value Date   WBC 5.1 05/20/2019   HGB 12.2 05/20/2019   HCT 36.7 05/20/2019   PLT 202.0 05/20/2019   GLUCOSE 93 06/03/2020   CHOL 177 06/03/2020   TRIG 129.0 06/03/2020   HDL 77.60 06/03/2020   LDLCALC 74 06/03/2020    ALT 15 06/03/2020   AST 23 06/03/2020   NA 138 06/03/2020   K 4.3 06/03/2020   CL 103 06/03/2020   CREATININE 1.20 06/03/2020   BUN 17 06/03/2020   CO2 27 06/03/2020   TSH 3.54 06/03/2020   INR 1.05 01/27/2014   HGBA1C 6.3 05/20/2019    Lab Results  Component Value Date   TSH 3.54 06/03/2020   Lab Results  Component Value Date   WBC 5.1 05/20/2019   HGB 12.2 05/20/2019   HCT 36.7 05/20/2019   MCV 96.3 05/20/2019   PLT 202.0 05/20/2019   Lab Results  Component Value Date   NA 138 06/03/2020   K 4.3 06/03/2020   CO2 27 06/03/2020   GLUCOSE 93 06/03/2020   BUN 17 06/03/2020   CREATININE 1.20 06/03/2020   BILITOT 0.7 06/03/2020   ALKPHOS 62 06/03/2020   AST 23 06/03/2020   ALT 15 06/03/2020   PROT 7.1 06/03/2020   ALBUMIN 4.2 06/03/2020   CALCIUM 9.6 06/03/2020   ANIONGAP 7 04/15/2015   GFR 43.37 (L) 06/03/2020   Lab Results  Component Value Date   CHOL 177 06/03/2020   Lab Results  Component Value Date   HDL 77.60 06/03/2020   Lab Results  Component Value Date   LDLCALC 74 06/03/2020   Lab Results  Component Value Date   TRIG 129.0 06/03/2020   Lab Results  Component Value Date   CHOLHDL 2 06/03/2020   Lab Results  Component Value Date   HGBA1C 6.3 05/20/2019       Assessment & Plan:   Problem List Items Addressed This Visit    Hypothyroidism - Primary    On Levothyroxine, continue to monitor      Hyperglycemia    hgba1c acceptable, minimize simple carbs. Increase exercise as tolerated.  Relevant Orders   Hemoglobin A1c   HTN (hypertension)    Well controlled, no changes to meds. Encouraged heart healthy diet such as the DASH diet and exercise as tolerated.       Relevant Orders   CBC   Comprehensive metabolic panel   TSH   Osteoporosis    Encouraged to get adequate exercise, calcium and vitamin d intake. Has a dexa scan ordered for the next few weeks and is encouraged to consider a trial of Fosamax which she has not  previously tried      Relevant Orders   VITAMIN D 25 Hydroxy (Vit-D Deficiency, Fractures)   Hyperlipidemia    Encouraged heart healthy diet, increase exercise, avoid trans fats, consider a krill oil cap daily, tolerating Rosuvastatin      Relevant Orders   Lipid panel   Tinea corporis    Nystatin cream bid to affected areas and let us know if it does not resolve      Relevant Medications   nystatin cream (MYCOSTATIN)      I am having Brynnlie Unterreiner. [place] maintain her cetirizine, Vitamin D, Calcium-Magnesium-Vitamin D (CALCIUM MAGNESIUM PO), acetaminophen, Coenzyme Q10, acyclovir ointment, hydrocortisone, metoprolol tartrate, nitroGLYCERIN, levothyroxine, rosuvastatin, Eliquis, and nystatin cream.  Meds ordered this encounter  Medications  . nystatin cream (MYCOSTATIN)    Sig: Apply 1 application topically 2 (two) times daily as needed for dry skin (rash on abdomen).    Dispense:  30 g    Refill:  2     Penni Homans, MD

## 2020-11-29 NOTE — Assessment & Plan Note (Signed)
Well controlled, no changes to meds. Encouraged heart healthy diet such as the DASH diet and exercise as tolerated.  °

## 2020-11-29 NOTE — Assessment & Plan Note (Signed)
On Levothyroxine, continue to monitor 

## 2020-11-29 NOTE — Patient Instructions (Addendum)
Recommend calcium intake of 1200 to 1500 mg daily, divided into roughly 3 doses. Best source is the diet and a single dairy serving is about 500 mg, a supplement of calcium citrate once or twice daily to balance diet is fine if not getting enough in diet. Also need Vitamin D 2000 IU caps, 1 cap daily if not already taking vitamin D. Also recommend weight baring exercise on hips and upper body to keep bones strong  Alendronate/Fosamax 70 mg weekly is a good price for the thinning bones.    Osteoporosis  Osteoporosis happens when the bones become thin and less dense than normal. Osteoporosis makes bones more brittle and fragile and more likely to break (fracture). Over time, osteoporosis can cause your bones to become so weak that they fracture after a minor fall. Bones in the hip, wrist, and spine are most likely to fracture due to osteoporosis. What are the causes? The exact cause of this condition is not known. What increases the risk? You are more likely to develop this condition if you:  Have family members with this condition.  Have poor nutrition.  Use the following: ? Steroid medicines, such as prednisone. ? Anti-seizure medicines. ? Nicotine or tobacco, such as cigarettes, e-cigarettes, and chewing tobacco.  Are female.  Are age 80 or older.  Are not physically active (are sedentary).  Are of European or Asian descent.  Have a small body frame. What are the signs or symptoms? A fracture might be the first sign of osteoporosis, especially if the fracture results from a fall or injury that usually would not cause a bone to break. Other signs and symptoms include:  Pain in the neck or low back.  Stooped posture.  Loss of height. How is this diagnosed? This condition may be diagnosed based on:  Your medical history.  A physical exam.  A bone mineral density test, also called a DXA or DEXA test (dual-energy X-ray absorptiometry test). This test uses X-rays to measure  the amount of minerals in your bones. How is this treated? This condition may be treated by:  Making lifestyle changes, such as: ? Including foods with more calcium and vitamin D in your diet. ? Doing weight-bearing and muscle-strengthening exercises. ? Stopping tobacco use. ? Limiting alcohol intake.  Taking medicine to slow the process of bone loss or to increase bone density.  Taking daily supplements of calcium and vitamin D.  Taking hormone replacement medicines, such as estrogen for women and testosterone for men.  Monitoring your levels of calcium and vitamin D. The goal of treatment is to strengthen your bones and lower your risk for a fracture. Follow these instructions at home: Eating and drinking Include calcium and vitamin D in your diet. Calcium is important for bone health, and vitamin D helps your body absorb calcium. Good sources of calcium and vitamin D include:  Certain fatty fish, such as salmon and tuna.  Products that have calcium and vitamin D added to them (are fortified), such as fortified cereals.  Egg yolks.  Cheese.  Liver.   Activity Do exercises as told by your health care provider. Ask your health care provider what exercises and activities are safe for you. You should do:  Exercises that make you work against gravity (weight-bearing exercises), such as tai chi, yoga, or walking.  Exercises to strengthen muscles, such as lifting weights. Lifestyle  Do not drink alcohol if: ? Your health care provider tells you not to drink. ? You are pregnant,  may be pregnant, or are planning to become pregnant.  If you drink alcohol: ? Limit how much you use to:  0-1 drink a day for women.  0-2 drinks a day for men.  Know how much alcohol is in your drink. In the [place] one drink equals one 12 oz bottle of beer (355 mL), one 5 oz glass of wine (148 mL), or one 1 oz glass of hard liquor (44 mL).  Do not use any products that contain nicotine or  tobacco, such as cigarettes, e-cigarettes, and chewing tobacco. If you need help quitting, ask your health care provider. Preventing falls  Use devices to help you move around (mobility aids) as needed, such as canes, walkers, scooters, or crutches.  Keep rooms well-lit and clutter-free.  Remove tripping hazards from walkways, including cords and throw rugs.  Install grab bars in bathrooms and safety rails on stairs.  Use rubber mats in the bathroom and other areas that are often wet or slippery.  Wear closed-toe shoes that fit well and support your feet. Wear shoes that have rubber soles or low heels.  Review your medicines with your health care provider. Some medicines can cause dizziness or changes in blood pressure, which can increase your risk of falling. General instructions  Take over-the-counter and prescription medicines only as told by your health care provider.  Keep all follow-up visits. This is important. Contact a health care provider if:  You have never been screened for osteoporosis and you are: ? A woman who is age 23 or older. ? A man who is age 46 or older. Get help right away if:  You fall or injure yourself. Summary  Osteoporosis is thinning and loss of density in your bones. This makes bones more brittle and fragile and more likely to break (fracture),even with minor falls.  The goal of treatment is to strengthen your bones and lower your risk for a fracture.  Include calcium and vitamin D in your diet. Calcium is important for bone health, and vitamin D helps your body absorb calcium.  Talk with your health care provider about screening for osteoporosis if you are a woman who is age 38 or older, or a man who is age 39 or older. This information is not intended to replace advice given to you by your health care provider. Make sure you discuss any questions you have with your health care provider. Document Revised: 03/25/2020 Document Reviewed:  03/25/2020 Elsevier Patient Education  [place] calcium intake of 1200 to 1500 mg daily, divided into roughly 3 doses. Best source is the diet and a single dairy serving is about 500 mg, a supplement of calcium citrate once or twice daily to balance diet is fine if not getting enough in diet. Also need Vitamin D 2000 IU caps, 1 cap daily if not already taking vitamin D. Also recommend weight baring exercise on hips and upper body to keep bones strong

## 2020-11-29 NOTE — Assessment & Plan Note (Signed)
Encouraged to get adequate exercise, calcium and vitamin d intake. Has a dexa scan ordered for the next few weeks and is encouraged to consider a trial of Fosamax which she has not previously tried

## 2020-11-29 NOTE — Assessment & Plan Note (Signed)
Nystatin cream bid to affected areas and let us know if it does not resolve

## 2020-11-30 ENCOUNTER — Other Ambulatory Visit: Payer: Self-pay

## 2020-11-30 DIAGNOSIS — E039 Hypothyroidism, unspecified: Secondary | ICD-10-CM

## 2020-11-30 LAB — CBC
HCT: 38.4 % (ref 36.0–46.0)
Hemoglobin: 12.7 g/dL (ref 12.0–15.0)
MCHC: 33.1 g/dL (ref 30.0–36.0)
MCV: 95.2 fl (ref 78.0–100.0)
Platelets: 182 10*3/uL (ref 150.0–400.0)
RBC: 4.03 Mil/uL (ref 3.87–5.11)
RDW: 13.4 % (ref 11.5–15.5)
WBC: 5.8 10*3/uL (ref 4.0–10.5)

## 2020-11-30 LAB — COMPREHENSIVE METABOLIC PANEL
ALT: 15 U/L (ref 0–35)
AST: 23 U/L (ref 0–37)
Albumin: 4.2 g/dL (ref 3.5–5.2)
Alkaline Phosphatase: 69 U/L (ref 39–117)
BUN: 18 mg/dL (ref 6–23)
CO2: 26 mEq/L (ref 19–32)
Calcium: 9.6 mg/dL (ref 8.4–10.5)
Chloride: 104 mEq/L (ref 96–112)
Creatinine, Ser: 1.17 mg/dL (ref 0.40–1.20)
GFR: 44.5 mL/min — ABNORMAL LOW (ref >60.00)
Glucose, Bld: 102 mg/dL — ABNORMAL HIGH (ref 70–99)
Potassium: 4.1 mEq/L (ref 3.5–5.1)
Sodium: 138 mEq/L (ref 135–145)
Total Bilirubin: 0.8 mg/dL (ref 0.2–1.2)
Total Protein: 7.2 g/dL (ref 6.0–8.3)

## 2020-11-30 LAB — LIPID PANEL
Cholesterol: 181 mg/dL (ref 0–200)
HDL: 81.3 mg/dL (ref >39.00)
LDL Cholesterol: 80 mg/dL (ref 0–99)
NonHDL: 99.42
Total CHOL/HDL Ratio: 2
Triglycerides: 97 mg/dL (ref 0.0–149.0)
VLDL: 19.4 mg/dL (ref 0.0–40.0)

## 2020-11-30 LAB — HEMOGLOBIN A1C: Hgb A1c MFr Bld: 6.4 % (ref 4.6–6.5)

## 2020-11-30 LAB — TSH: TSH: 0.3 u[IU]/mL — ABNORMAL LOW (ref 0.35–4.50)

## 2020-12-01 ENCOUNTER — Encounter: Payer: Self-pay | Admitting: Family Medicine

## 2020-12-01 DIAGNOSIS — Z780 Asymptomatic menopausal state: Secondary | ICD-10-CM | POA: Diagnosis not present

## 2020-12-01 DIAGNOSIS — Z1231 Encounter for screening mammogram for malignant neoplasm of breast: Secondary | ICD-10-CM | POA: Diagnosis not present

## 2020-12-01 DIAGNOSIS — Z803 Family history of malignant neoplasm of breast: Secondary | ICD-10-CM | POA: Diagnosis not present

## 2020-12-01 DIAGNOSIS — M85852 Other specified disorders of bone density and structure, left thigh: Secondary | ICD-10-CM | POA: Diagnosis not present

## 2020-12-01 LAB — HM DEXA SCAN

## 2020-12-01 LAB — HM MAMMOGRAPHY

## 2020-12-02 ENCOUNTER — Encounter: Payer: Self-pay | Admitting: *Deleted

## 2020-12-02 NOTE — Telephone Encounter (Signed)
Medication list has been updated.

## 2020-12-03 ENCOUNTER — Other Ambulatory Visit: Payer: PPO

## 2020-12-03 DIAGNOSIS — M810 Age-related osteoporosis without current pathological fracture: Secondary | ICD-10-CM | POA: Diagnosis not present

## 2020-12-03 LAB — VITAMIN D 25 HYDROXY (VIT D DEFICIENCY, FRACTURES): Vit D, 25-Hydroxy: 30 ng/mL (ref 30–100)

## 2020-12-03 NOTE — Addendum Note (Signed)
Addended by: Kelle Darting A on: 12/03/2020 01:35 PM   Modules accepted: Orders

## 2020-12-03 NOTE — Addendum Note (Signed)
Addended by: Kelle Darting A on: 12/03/2020 02:01 PM   Modules accepted: Orders

## 2020-12-06 ENCOUNTER — Telehealth: Payer: Self-pay | Admitting: *Deleted

## 2020-12-06 ENCOUNTER — Ambulatory Visit: Payer: PPO | Admitting: Family Medicine

## 2020-12-06 ENCOUNTER — Encounter: Payer: Self-pay | Admitting: *Deleted

## 2020-12-06 NOTE — Telephone Encounter (Signed)
-----   Message from Mosie Lukes, MD sent at 12/05/2020 11:58 AM EST ----- Notify vitamin D is normal but low normal. Increase daily vitamin D supplement by 1000 IU daily, start if not taking.

## 2020-12-06 NOTE — Telephone Encounter (Signed)
She can although my favorite would be 5000 IU a day so when she runs out of this bottle then lots of companies make a 5000 IU cap

## 2020-12-06 NOTE — Telephone Encounter (Signed)
Patient stated that she is taking 2 caps of vitamin d which is 4000 per day. She would like to know if she can just increase to 6000 IU instead.

## 2020-12-06 NOTE — Telephone Encounter (Signed)
Patient notified

## 2020-12-14 DIAGNOSIS — L309 Dermatitis, unspecified: Secondary | ICD-10-CM | POA: Diagnosis not present

## 2020-12-14 DIAGNOSIS — D485 Neoplasm of uncertain behavior of skin: Secondary | ICD-10-CM | POA: Diagnosis not present

## 2020-12-22 DIAGNOSIS — H04123 Dry eye syndrome of bilateral lacrimal glands: Secondary | ICD-10-CM | POA: Diagnosis not present

## 2020-12-22 DIAGNOSIS — H524 Presbyopia: Secondary | ICD-10-CM | POA: Diagnosis not present

## 2020-12-22 DIAGNOSIS — H43813 Vitreous degeneration, bilateral: Secondary | ICD-10-CM | POA: Diagnosis not present

## 2020-12-22 DIAGNOSIS — H26491 Other secondary cataract, right eye: Secondary | ICD-10-CM | POA: Diagnosis not present

## 2021-01-01 NOTE — Progress Notes (Unsigned)
Cardiology Office Note:    Date:  01/06/2021   ID:  Wendy Munoz, DOB 08-May-1941, MRN [id number]  PCP:  Wendy Lukes, MD  Cardiologist:  Wendy Grooms, MD   Referring MD: Wendy Lukes, MD   Chief Complaint  Patient presents with  . Atrial Fibrillation  . Coronary Artery Disease    History of Present Illness:    Wendy Munoz is a 80 y.o. female with a hx of paroxysmal atrial fibrillation, acute inferior infarct April 2016 treated with proximal RCA DES,residual 60-75% segmental proximal LAD stenosis,hypertension, prediabetes, and chronic diastolic heart failure.   [name] is doing relatively well.  She had a stent placed in the right coronary in 2016.  She has had paroxysmal atrial fibrillation but no recent difficulty.  She is stuck on Eliquis and has difficulty affording it.  When she is in the donut hole she has to pay $360 for a 90-monthsupply.  Otherwise it is $90 for 3 months.  She is able to do what she needs to do at home.  No difficulty with ambulation, dyspnea, edema, orthopnea, chest pain, or palpitations.  Past Medical History:  Diagnosis Date  . Allergic state 06/26/2017  . Anemia 05/21/2017  . Arthritis   . [place]    a. asymptomatic. Noted on tele during 11/2014 admission   . CAD (coronary artery disease)    a. 11/2014 inf STEMI s/p DES to RCA; 50-70% ruptured plaque in pLAD- rx medically  . GERD (gastroesophageal reflux disease)   . HTN (hypertension)   . Hyperglycemia   . Hyperlipidemia   . Hypothyroidism   . Insomnia 05/27/2017  . Ischemic cardiomyopathy    a. 11/2014  LV gram with inferior hypokinesis. EF 45-50%.  . Nocturia 01/08/2017  . Obesity 06/26/2017  . Osteoporosis   . [place]    a. isolated episode of atrial fibrillation several years ago associated w/ her thyroid dysfunction (prior to ablation).   . Prediabetes   . Preventative health care 06/26/2017  . Vasomotor rhinitis 06/26/2017     Past Surgical History:  Procedure Laterality Date  . BREAST SURGERY     breast biospy  . CATARACT EXTRACTION Bilateral   . COLONOSCOPY    . EYE SURGERY Bilateral 2005, 2006   Cataract with lens ImplaNT   . INGUINAL HERNIA REPAIR Right   . LEFT HEART CATHETERIZATION WITH CORONARY ANGIOGRAM N/A 12/02/2014   Procedure: LEFT HEART CATHETERIZATION WITH CORONARY ANGIOGRAM;  Surgeon: HSinclair Grooms MD;  [facility];  Service: Cardiovascular;  Laterality: N/A;  . ORIF HUMERUS FRACTURE Right 01/27/2014   Procedure: RIGHT OPEN REDUCTION INTERNAL FIXATION (ORIF) PROXIMAL HUMERUS FRACTURE;  Surgeon: MRozanna Box MD;  [facility]  Service: Orthopedics;  Laterality: Right;  . TOTAL KNEE ARTHROPLASTY Right 2006  . TOTAL KNEE ARTHROPLASTY Left 2011  . UMBILICAL HERNIA REPAIR     as a baby    Current Medications: Current Meds  Medication Sig  . acetaminophen (TYLENOL) 500 MG tablet Take 1-2 tablets (500-1,000 mg total) by mouth every 6 (six) hours as needed for moderate pain or fever.  .Marland Kitchenacyclovir ointment (ZOVIRAX) 5 % Apply small amount to affected 5 x daily x 4 days and as needed cold sore  . Calcium-Magnesium-Vitamin D (CALCIUM MAGNESIUM PO) Take 1 capsule by mouth daily.  . cetirizine (ZYRTEC) 10 MG tablet Take 10 mg by mouth at bedtime.  . Cholecalciferol (VITAMIN  D) 2000 UNITS tablet Take 6,000 Units by mouth daily.  . Coenzyme Q10 200 MG TABS Take 200 mg by mouth daily.  Marland Kitchen ELIQUIS 5 MG TABS tablet TAKE 1 TABLET BY MOUTH TWICE A DAY  . hydrocortisone (ANUSOL-HC) 25 MG suppository Place 1 suppository (25 mg total) rectally at bedtime as needed for hemorrhoids or anal itching.  . levothyroxine (SYNTHROID) 88 MCG tablet Take 88 mcg by mouth. Take 1 tablet by mouth daily except for Saturday (skip dose).  . nitroGLYCERIN (NITROSTAT) 0.4 MG SL tablet Place 1 tablet (0.4 mg total) under the tongue every 5 (five) minutes as needed for chest pain.  Marland Kitchen nystatin cream (MYCOSTATIN)  Apply 1 application topically 2 (two) times daily as needed for dry skin (rash on abdomen).  . rosuvastatin (CRESTOR) 40 MG tablet Take 0.5 tablets (20 mg total) by mouth daily.  . [DISCONTINUED] metoprolol tartrate (LOPRESSOR) 25 MG tablet TAKE 1 TABLET (25 MG TOTAL) BY MOUTH 2 (TWO) TIMES DAILY.     Allergies:   Keflex [cephalexin]   Social History   Socioeconomic History  . Marital status: Married    Spouse name: Not on file  . Number of children: Not on file  . Years of education: Not on file  . Highest education level: Not on file  Occupational History  . Occupation: retired   Tobacco Use  . Smoking status: Never Smoker  . Smokeless tobacco: Never Used  Vaping Use  . Vaping Use: Never used  Substance and Sexual Activity  . Alcohol use: No    Alcohol/week: 0.0 standard drinks  . Drug use: No  . Sexual activity: Yes    Partners: Male  Other Topics Concern  . Not on file  Social History Narrative  . Not on file   Social Determinants of Health   Financial Resource Strain: Not on file  Food Insecurity: Not on file  Transportation Needs: Not on file  Physical Activity: Not on file  Stress: Not on file  Social Connections: Not on file     Family History: The patient's family history includes Arthritis in her maternal aunt; Breast cancer in her maternal aunt and another family member; Breast cancer (age of onset: 56) in her sister; CAD in her mother; Diabetes in her paternal grandfather; Heart disease in her brother and mother; Heart failure in her mother; Hyperlipidemia in her sister; Hypertension in her brother and sister; Lung cancer in her paternal grandmother; Multiple myeloma in her father; Polycythemia in her father; Prostate cancer in her paternal grandfather; Stroke in her sister.  ROS:   Please see the history of present illness.    No new data.  No blood in the urine or stool.  All other systems reviewed and are negative.  EKGs/Labs/Other Studies Reviewed:     The following studies were reviewed today: No new or recent imaging data  EKG:  EKG sinus rhythm, low volume QRS complexes, with normal PR interval.  When compared to prior on 12/29/2019, no change is noted.  Recent Labs: 11/29/2020: ALT 15; BUN 18; Creatinine, Ser 1.17; Hemoglobin 12.7; Platelets 182.0; Potassium 4.1; Sodium 138; TSH 0.30  Recent Lipid Panel    Component Value Date/Time   CHOL 181 11/29/2020 1442   TRIG 97.0 11/29/2020 1442   HDL 81.30 11/29/2020 1442   CHOLHDL 2 11/29/2020 1442   VLDL 19.4 11/29/2020 1442   LDLCALC 80 11/29/2020 1442    Physical Exam:    VS:  BP 122/68   Pulse 61  Ht 5' 2"  (1.575 m)   Wt 210 lb (95.3 kg)   SpO2 96%   BMI 38.41 kg/m     Wt Readings from Last 3 Encounters:  01/06/21 210 lb (95.3 kg)  06/03/20 215 lb 12.8 oz (97.9 kg)  12/29/19 224 lb 3.2 oz (101.7 kg)     GEN: Obese. No acute distress HEENT: Normal NECK: No JVD. LYMPHATICS: No lymphadenopathy CARDIAC: No murmur. RRR no gallop, or edema. VASCULAR:  Normal Pulses. No bruits. RESPIRATORY:  Clear to auscultation without rales, wheezing or rhonchi  ABDOMEN: Soft, non-tender, non-distended, No pulsatile mass, MUSCULOSKELETAL: No deformity  SKIN: Warm and dry NEUROLOGIC:  Alert and oriented x 3 PSYCHIATRIC:  Normal affect   ASSESSMENT:    1. Coronary artery disease involving native coronary artery of native heart without angina pectoris   2. PAF (paroxysmal atrial fibrillation) ([place])   3. Hyperlipidemia, unspecified hyperlipidemia type   4. Essential hypertension   5. On apixaban therapy   6. Snoring    PLAN:    In order of problems listed above:  1. Secondary prevention discussed. 2. Continue Eliquis. 3. Continue Crestor 40 mg/day.  Most recent LDL was 80 in February 2020.  Decrease carbohydrate and saturated fat in diet. 4. Blood pressure control is adequate.  Continue Lopressor, low-salt diet, and attempt weight loss. 5. Having difficulty affording  apixaban.  We will see if she is able to qualify for the Eliquis $10 co-pay extend card which her daughter who also has atrial fibrillation has been able to get from the company.  This card is for a 2-year timeframe.  According to the patient, the daughter's salary is $80,000 a year.  This was gotten from the daughters cardiologist 6. No diagnosis of sleep apnea but is suspect.  Overall education and awareness concerning primary/secondary risk prevention was discussed in detail: LDL less than 70, hemoglobin A1c less than 7, blood pressure target less than 130/80 mmHg, >150 minutes of moderate aerobic activity per week, avoidance of smoking, weight control (via diet and exercise), and continued surveillance/management of/for obstructive sleep apnea.    Medication Adjustments/Labs and Tests Ordered: Current medicines are reviewed at length with the patient today.  Concerns regarding medicines are outlined above.  Orders Placed This Encounter  Procedures  . EKG 12-Lead   Meds ordered this encounter  Medications  . metoprolol tartrate (LOPRESSOR) 25 MG tablet    Sig: TAKE 1 TABLET (25 MG TOTAL) BY MOUTH 2 (TWO) TIMES DAILY.    Dispense:  180 tablet    Refill:  3    Patient Instructions  Medication Instructions:  Your physician recommends that you continue on your current medications as directed. Please refer to the Current Medication list given to you today.  *If you need a refill on your cardiac medications before your next appointment, please call your pharmacy*   Lab Work: None If you have labs (blood work) drawn today and your tests are completely normal, you will receive your results only by: Marland Kitchen MyChart Message (if you have MyChart) OR . A paper copy in the mail If you have any lab test that is abnormal or we need to change your treatment, we will call you to review the results.   Testing/Procedures: None   Follow-Up: At [facility], you and your health needs are our  priority.  As part of our continuing mission to provide you with exceptional heart care, we have created designated Provider Care Teams.  These Care Teams include your  primary Cardiologist (physician) and Advanced Practice Providers (APPs -  Physician Assistants and Nurse Practitioners) who all work together to provide you with the care you need, when you need it.  We recommend signing up for the patient portal called "MyChart".  Sign up information is provided on this After Visit Summary.  MyChart is used to connect with patients for Virtual Visits (Telemedicine).  Patients are able to view lab/test results, encounter notes, upcoming appointments, etc.  Non-urgent messages can be sent to your provider as well.   To learn more about what you can do with MyChart, go to [email or web address].    Your next appointment:   1 year(s)  The format for your next appointment:   In Person  Provider:   You may see Wendy Grooms, MD or one of the following Advanced Practice Providers on your designated Care Team:    Kathyrn Drown, NP    Other Instructions      Signed, Wendy Grooms, MD  01/06/2021 2:55 PM    [place]

## 2021-01-06 ENCOUNTER — Encounter: Payer: Self-pay | Admitting: Interventional Cardiology

## 2021-01-06 ENCOUNTER — Other Ambulatory Visit: Payer: Self-pay

## 2021-01-06 ENCOUNTER — Ambulatory Visit: Payer: PPO | Admitting: Interventional Cardiology

## 2021-01-06 VITALS — BP 122/68 | HR 61 | Ht 62.0 in | Wt 210.0 lb

## 2021-01-06 DIAGNOSIS — I10 Essential (primary) hypertension: Secondary | ICD-10-CM

## 2021-01-06 DIAGNOSIS — E785 Hyperlipidemia, unspecified: Secondary | ICD-10-CM | POA: Diagnosis not present

## 2021-01-06 DIAGNOSIS — R0683 Snoring: Secondary | ICD-10-CM

## 2021-01-06 DIAGNOSIS — I480 Paroxysmal atrial fibrillation: Secondary | ICD-10-CM | POA: Diagnosis not present

## 2021-01-06 DIAGNOSIS — Z7901 Long term (current) use of anticoagulants: Secondary | ICD-10-CM

## 2021-01-06 DIAGNOSIS — I2510 Atherosclerotic heart disease of native coronary artery without angina pectoris: Secondary | ICD-10-CM

## 2021-01-06 MED ORDER — METOPROLOL TARTRATE 25 MG PO TABS: ORAL_TABLET | ORAL | 3 refills | Status: DC

## 2021-01-06 NOTE — Patient Instructions (Signed)

## 2021-01-31 ENCOUNTER — Other Ambulatory Visit: Payer: Self-pay

## 2021-01-31 ENCOUNTER — Encounter: Payer: Self-pay | Admitting: Family Medicine

## 2021-01-31 MED ORDER — LEVOTHYROXINE SODIUM 88 MCG PO TABS: 88.0000 ug | ORAL_TABLET | Freq: Every day | ORAL | 0 refills | Status: DC

## 2021-03-10 ENCOUNTER — Other Ambulatory Visit: Payer: Self-pay

## 2021-03-10 ENCOUNTER — Other Ambulatory Visit (INDEPENDENT_AMBULATORY_CARE_PROVIDER_SITE_OTHER): Payer: PPO

## 2021-03-10 DIAGNOSIS — E039 Hypothyroidism, unspecified: Secondary | ICD-10-CM

## 2021-03-11 ENCOUNTER — Other Ambulatory Visit: Payer: Self-pay | Admitting: Family Medicine

## 2021-03-11 LAB — TSH: TSH: 0.93 u[IU]/mL (ref 0.35–4.50)

## 2021-03-11 MED ORDER — LEVOTHYROXINE SODIUM 88 MCG PO TABS: 88.0000 ug | ORAL_TABLET | Freq: Every day | ORAL | 1 refills | Status: DC

## 2021-03-21 ENCOUNTER — Other Ambulatory Visit: Payer: Self-pay | Admitting: Family Medicine

## 2021-04-15 ENCOUNTER — Other Ambulatory Visit: Payer: Self-pay | Admitting: Interventional Cardiology

## 2021-04-15 DIAGNOSIS — I2510 Atherosclerotic heart disease of native coronary artery without angina pectoris: Secondary | ICD-10-CM

## 2021-04-15 DIAGNOSIS — I480 Paroxysmal atrial fibrillation: Secondary | ICD-10-CM

## 2021-04-15 DIAGNOSIS — I10 Essential (primary) hypertension: Secondary | ICD-10-CM

## 2021-04-15 NOTE — Telephone Encounter (Signed)
Prescription refill request for Eliquis received. Indication:afib Last office visit:01/06/21 Scr:1.17 on 11/29/20 Age: 80 Weight:95

## 2021-06-13 ENCOUNTER — Encounter: Payer: PPO | Admitting: Family Medicine

## 2021-06-14 ENCOUNTER — Ambulatory Visit (INDEPENDENT_AMBULATORY_CARE_PROVIDER_SITE_OTHER): Payer: PPO | Admitting: Family Medicine

## 2021-06-14 ENCOUNTER — Other Ambulatory Visit: Payer: Self-pay | Admitting: Family Medicine

## 2021-06-14 ENCOUNTER — Encounter: Payer: Self-pay | Admitting: Family Medicine

## 2021-06-14 ENCOUNTER — Other Ambulatory Visit: Payer: Self-pay

## 2021-06-14 ENCOUNTER — Ambulatory Visit (HOSPITAL_BASED_OUTPATIENT_CLINIC_OR_DEPARTMENT_OTHER)
Admission: RE | Admit: 2021-06-14 | Discharge: 2021-06-14 | Disposition: A | Discharge status: Home / Self Care | Payer: PPO | Source: Ambulatory Visit | Attending: Family Medicine | Admitting: Family Medicine

## 2021-06-14 VITALS — BP 119/53 | HR 54 | Temp 97.7°F | Resp 16 | Ht 62.0 in | Wt 211.0 lb

## 2021-06-14 DIAGNOSIS — Z0000 Encounter for general adult medical examination without abnormal findings: Secondary | ICD-10-CM

## 2021-06-14 DIAGNOSIS — M25551 Pain in right hip: Secondary | ICD-10-CM | POA: Insufficient documentation

## 2021-06-14 DIAGNOSIS — M810 Age-related osteoporosis without current pathological fracture: Secondary | ICD-10-CM | POA: Diagnosis not present

## 2021-06-14 DIAGNOSIS — E785 Hyperlipidemia, unspecified: Secondary | ICD-10-CM | POA: Diagnosis not present

## 2021-06-14 DIAGNOSIS — I10 Essential (primary) hypertension: Secondary | ICD-10-CM

## 2021-06-14 DIAGNOSIS — L719 Rosacea, unspecified: Secondary | ICD-10-CM

## 2021-06-14 DIAGNOSIS — E039 Hypothyroidism, unspecified: Secondary | ICD-10-CM | POA: Diagnosis not present

## 2021-06-14 DIAGNOSIS — R739 Hyperglycemia, unspecified: Secondary | ICD-10-CM | POA: Diagnosis not present

## 2021-06-14 MED ORDER — TIZANIDINE HCL 2 MG PO TABS: 1.0000 mg | ORAL_TABLET | Freq: Every evening | ORAL | 1 refills | Status: DC | PRN

## 2021-06-14 MED ORDER — METHYLPREDNISOLONE ACETATE 40 MG/ML IJ SUSP
40.0000 mg | Freq: Once | INTRAMUSCULAR | Status: AC
  Administered 2021-06-14: 40 mg via INTRAMUSCULAR

## 2021-06-14 NOTE — Progress Notes (Signed)
Patient ID: Wendy Munoz, female    DOB: June 18, 1941  Age: 80 y.o. MRN: [id number]    Subjective:  Subjective  HPI Wendy Munoz presents for office visit today for comprehensive physical exam today and follow up on management of chronic concerns. She has c/o right hip pain that radiates to her right LE. She expresses concern that it might have been due to a fall she had in 2018 that caused a complication. She states that last Monday she had sudden sharp pain in her hip. She states trying topical creams and OTC pain medications, which she states has helped relieve some of the pain. She describes the pain feeling like a knot in her hip. She has also used a tramadol px she had from a previous surgery. On a day she had tylenol at 3:30pm and tramadol at 9:30pm, she was pale and felt sick. Her vitals that day were 93/53 and her O2 was down to 89%. She denies experiencing any diarrhea, dysuria , nausea, appetite changes, or urinary incontinence. She states that she does not feel pain when sitting or transitioning from sitting to standing. However, she experiences hip pain when walking. She states that she had tried muscle relaxers before when she has had her knee surgery. Denies CP/palp/SOB/HA/congestion/fevers/GI or GU c/o. Taking meds as prescribed.  She reports that her 44 y/o sister was dx with an aggressive form of breast cancer. Her brother has a pacemaker defib implant. Her daughter Wendy Munoz has been dx with AFIB and currently takes Eliquis.   Review of Systems  Constitutional:  Negative for chills, fatigue and fever.  HENT:  Negative for congestion, rhinorrhea, sinus pressure, sinus pain and sore throat.   Eyes:  Negative for pain.  Respiratory:  Negative for cough and shortness of breath.   Cardiovascular:  Negative for chest pain, palpitations and leg swelling.  Gastrointestinal:  Negative for abdominal pain, blood in stool, diarrhea, nausea and vomiting.  Genitourinary:  Negative for decreased  urine volume, dysuria, flank pain, frequency, vaginal bleeding and vaginal discharge.  Musculoskeletal:  Positive for arthralgias. Negative for back pain.  Neurological:  Negative for headaches.   History Past Medical History:  Diagnosis Date   Allergic state 06/26/2017   Anemia 05/21/2017   Arthritis    Atrial tachycardia ([place])    a. asymptomatic. Noted on tele during 11/2014 admission    CAD (coronary artery disease)    a. 11/2014 inf STEMI s/p DES to RCA; 50-70% ruptured plaque in pLAD- rx medically   GERD (gastroesophageal reflux disease)    HTN (hypertension)    Hyperglycemia    Hyperlipidemia    Hypothyroidism    Insomnia 05/27/2017   Ischemic cardiomyopathy    a. 11/2014  LV gram with inferior hypokinesis. EF 45-50%.   Nocturia 01/08/2017   Obesity 06/26/2017   Osteoporosis    PAF (paroxysmal atrial fibrillation) ([place])    a. isolated episode of atrial fibrillation several years ago associated w/ her thyroid dysfunction (prior to ablation).     Prediabetes    Preventative health care 06/26/2017   Vasomotor rhinitis 06/26/2017    She has a past surgical history that includes Inguinal hernia repair (Right); Umbilical hernia repair; Total knee arthroplasty (Right, 2006); Total knee arthroplasty (Left, 2011); Cataract extraction (Bilateral); Colonoscopy; Breast surgery; Eye surgery (Bilateral, 2005, 2006); ORIF humerus fracture (Right, 01/27/2014); and left heart catheterization with coronary angiogram (N/A, 12/02/2014).   Her family history includes Arthritis in her maternal aunt; Atrial fibrillation in her daughter;  Breast cancer in her maternal aunt and another family member; Breast cancer (age of onset: 27) in her sister; CAD in her mother; Cancer in her sister; Diabetes in her paternal grandfather; Heart disease in her brother and mother; Heart failure in her mother; Hyperlipidemia in her sister; Hypertension in her brother and sister; Lung cancer in her paternal grandmother; Multiple myeloma  in her father; Neuropathy in her sister; Polycythemia in her father; Prostate cancer in her paternal grandfather; Stroke in her sister.She reports that she has never smoked. She has never used smokeless tobacco. She reports that she does not drink alcohol and does not use drugs.  Current Outpatient Medications on File Prior to Visit  Medication Sig Dispense Refill   acetaminophen (TYLENOL) 500 MG tablet Take 1-2 tablets (500-1,000 mg total) by mouth every 6 (six) hours as needed for moderate pain or fever. 90 tablet 0   acyclovir ointment (ZOVIRAX) 5 % Apply small amount to affected 5 x daily x 4 days and as needed cold sore 30 g 1   Calcium-Magnesium-Vitamin D (CALCIUM MAGNESIUM PO) Take 1 capsule by mouth daily.     cetirizine (ZYRTEC) 10 MG tablet Take 10 mg by mouth at bedtime.     Cholecalciferol (VITAMIN D) 2000 UNITS tablet Take 6,000 Units by mouth daily.     Coenzyme Q10 200 MG TABS Take 200 mg by mouth daily.     ELIQUIS 5 MG TABS tablet TAKE 1 TABLET BY MOUTH TWICE A DAY 180 tablet 1   levothyroxine (SYNTHROID) 88 MCG tablet Take 1 tablet (88 mcg total) by mouth daily before breakfast. Take 1 tablet by mouth daily except for Saturday (skip dose). 90 tablet 1   metoprolol tartrate (LOPRESSOR) 25 MG tablet TAKE 1 TABLET (25 MG TOTAL) BY MOUTH 2 (TWO) TIMES DAILY. 180 tablet 3   nitroGLYCERIN (NITROSTAT) 0.4 MG SL tablet Place 1 tablet (0.4 mg total) under the tongue every 5 (five) minutes as needed for chest pain. 25 tablet 6   nystatin cream (MYCOSTATIN) Apply 1 application topically 2 (two) times daily as needed for dry skin (rash on abdomen). 30 g 2   rosuvastatin (CRESTOR) 40 MG tablet TAKE 1/2 TABLET BY MOUTH DAILY 45 tablet 1   No current facility-administered medications on file prior to visit.     Objective:  Objective  Physical Exam Constitutional:      General: She is not in acute distress.    Appearance: Normal appearance. She is not ill-appearing or toxic-appearing.   HENT:     Head: Normocephalic and atraumatic.     Right Ear: Tympanic membrane, ear canal and external ear normal.     Left Ear: Tympanic membrane, ear canal and external ear normal.     Nose: No congestion or rhinorrhea.  Eyes:     Extraocular Movements: Extraocular movements intact.     Right eye: No nystagmus.     Left eye: No nystagmus.     Pupils: Pupils are equal, round, and reactive to light.  Cardiovascular:     Rate and Rhythm: Normal rate and regular rhythm.     Pulses: Normal pulses.     Heart sounds: Normal heart sounds. No murmur heard. Pulmonary:     Effort: Pulmonary effort is normal. No respiratory distress.     Breath sounds: Normal breath sounds. No wheezing, rhonchi or rales.  Abdominal:     General: Bowel sounds are normal.     Palpations: Abdomen is soft. There is no mass.  Tenderness: no abdominal tenderness There is no guarding.     Hernia: No hernia is present.  Musculoskeletal:        General: Normal range of motion.     Cervical back: Normal range of motion and neck supple.     Right lower leg: Edema present.     Left lower leg: Edema present.  Skin:    General: Skin is warm and dry.  Neurological:     Mental Status: She is alert and oriented to person, place, and time.     Cranial Nerves: No facial asymmetry.     Motor: Motor function is intact. No weakness.  Psychiatric:        Behavior: Behavior normal.   BP (!) 119/53   Pulse (!) 54   Temp 97.7 F (36.5 C)   Resp 16   Ht 5' 2"  (1.575 m)   Wt 211 lb (95.7 kg)   SpO2 99%   BMI 38.59 kg/m  Wt Readings from Last 3 Encounters:  06/14/21 211 lb (95.7 kg)  01/06/21 210 lb (95.3 kg)  06/03/20 215 lb 12.8 oz (97.9 kg)     Lab Results  Component Value Date   WBC 5.9 06/14/2021   HGB 12.1 06/14/2021   HCT 37.0 06/14/2021   PLT 181.0 06/14/2021   GLUCOSE 102 (H) 11/29/2020   CHOL 174 06/14/2021   TRIG 110.0 06/14/2021   HDL 79.40 06/14/2021   LDLCALC 73 06/14/2021   ALT 15  11/29/2020   AST 23 11/29/2020   NA 138 11/29/2020   K 4.1 11/29/2020   CL 104 11/29/2020   CREATININE 1.17 11/29/2020   BUN 18 11/29/2020   CO2 26 11/29/2020   TSH 1.29 06/14/2021   INR 1.05 01/27/2014   HGBA1C 6.4 06/14/2021    DG Chest 2 View  Result Date: 09/23/2018 CLINICAL DATA:  Cough for a week. EXAM: CHEST - 2 VIEW COMPARISON:  January 27, 2014 FINDINGS: Mild atelectasis in the left base. The heart, hila, mediastinum, lungs, and pleura are otherwise unremarkable. IMPRESSION: No active cardiopulmonary disease. Electronically Signed   By: Dorise Bullion III M.D   On: 09/23/2018 16:13     Assessment & Plan:  Plan    Meds ordered this encounter  Medications   tiZANidine (ZANAFLEX) 2 MG tablet    Sig: Take 0.5-1 tablets (1-2 mg total) by mouth at bedtime as needed for muscle spasms.    Dispense:  30 tablet    Refill:  1   methylPREDNISolone acetate (DEPO-MEDROL) injection 40 mg     Problem List Items Addressed This Visit     Hypothyroidism - Primary    On Levothyroxine, continue to monitor      Relevant Orders   TSH (Completed)   Hyperglycemia    hgba1c acceptable, minimize simple carbs. Increase exercise as tolerated.       Relevant Orders   Hemoglobin A1c (Completed)   HTN (hypertension)    Well controlled, no changes to meds. Encouraged heart healthy diet such as the DASH diet and exercise as tolerated.       Osteoporosis    Encouraged to get adequate exercise, calcium and vitamin d intake      Relevant Orders   Vitamin D 1,25 dihydroxy   Hyperlipidemia    Tolerating statin, encouraged heart healthy diet, avoid trans fats, minimize simple carbs and saturated fats. Increase exercise as tolerated      Relevant Orders   CBC with Differential/Platelet (Completed)   Lipid  panel (Completed)   Preventative health care    Patient encouraged to maintain heart healthy diet, regular exercise, adequate sleep. Consider daily probiotics. Take medications as  prescribed. Encourage heart healthy diet such as MIND or DASH diet, increase exercise, avoid trans fats, simple carbohydrates and processed foods, consider a krill or fish or flaxseed oil cap daily. Declines immunizations.       Rosacea    Consider Rosacea and follow up with dermatology      Right hip pain    Xray of hip ordered, started on Tizanidine 1-2 mg qhs prn, Medrol dosepak and referred to Sports Medicine      Relevant Orders   Ambulatory referral to Sports Medicine    Follow-up: Return in about 4 months (around 10/14/2021).  I, Suezanne Jacquet, acting as a scribe for Penni Homans, MD, have documented all relevent documentation on behalf of Penni Homans, MD, as directed by Penni Homans, MD while in the presence of Penni Homans, MD.  I, Mosie Lukes, MD personally performed the services described in this documentation. All medical record entries made by the scribe were at my direction and in my presence. I have reviewed the chart and agree that the record reflects my personal performance and is accurate and complete

## 2021-06-14 NOTE — Assessment & Plan Note (Signed)
Patient encouraged to maintain heart healthy diet, regular exercise, adequate sleep. Consider daily probiotics. Take medications as prescribed. Encourage heart healthy diet such as MIND or DASH diet, increase exercise, avoid trans fats, simple carbohydrates and processed foods, consider a krill or fish or flaxseed oil cap daily. Declines immunizations.

## 2021-06-14 NOTE — Patient Instructions (Addendum)
Tylenol/Acetaminophen max of 3000 mg in 24 hours Shingrix is the new shingles shot, 2 shots over 2-6 months, confirm coverage with insurance and document, then can return here for shots with nurse appt or at pharmacy   Try getting the Tdap at the [facility] 80 Years and Older, Female Preventive care refers to lifestyle choices and visits with your health care provider that can promote health and wellness. This includes: A yearly physical exam. This is also called an annual wellness visit. Regular dental and eye exams. Immunizations. Screening for certain conditions. Healthy lifestyle choices, such as: Eating a healthy diet. Getting regular exercise. Not using drugs or products that contain nicotine and tobacco. Limiting alcohol use. What can I expect for my preventive care visit? Physical exam Your health care provider will check your: Height and weight. These may be used to calculate your BMI (body mass index). BMI is a measurement that tells if you are at a healthy weight. Heart rate and blood pressure. Body temperature. Skin for abnormal spots. Counseling Your health care provider may ask you questions about your: Past medical problems. Family's medical history. Alcohol, tobacco, and drug use. Emotional well-being. Home life and relationship well-being. Sexual activity. Diet, exercise, and sleep habits. History of falls. Memory and ability to understand (cognition). Work and work Statistician. Pregnancy and menstrual history. Access to firearms. What immunizations do I need?  Vaccines are usually given at various ages, according to a schedule. Your health care provider will recommend vaccines for you based on your age, medicalhistory, and lifestyle or other factors, such as travel or where you work. What tests do I need? Blood tests Lipid and cholesterol levels. These may be checked every 5 years, or more often depending on your overall  health. Hepatitis C test. Hepatitis B test. Screening Lung cancer screening. You may have this screening every year starting at age 80 if you have a 30-pack-year history of smoking and currently smoke or have quit within the past 15 years. Colorectal cancer screening. All adults should have this screening starting at age 80 and continuing until age 38. Your health care provider may recommend screening at age 24 if you are at increased risk. You will have tests every 1-10 years, depending on your results and the type of screening test. Diabetes screening. This is done by checking your blood sugar (glucose) after you have not eaten for a while (fasting). You may have this done every 1-3 years. Mammogram. This may be done every 1-2 years. Talk with your health care provider about how often you should have regular mammograms. Abdominal aortic aneurysm (AAA) screening. You may need this if you are a current or former smoker. BRCA-related cancer screening. This may be done if you have a family history of breast, ovarian, tubal, or peritoneal cancers. Other tests STD (sexually transmitted disease) testing, if you are at risk. Bone density scan. This is done to screen for osteoporosis. You may have this done starting at age 80. Talk with your health care provider about your test results, treatment options,and if necessary, the need for more tests. Follow these instructions at home: Eating and drinking  Eat a diet that includes fresh fruits and vegetables, whole grains, lean protein, and low-fat dairy products. Limit your intake of foods with high amounts of sugar, saturated fats, and salt. Take vitamin and mineral supplements as recommended by your health care provider. Do not drink alcohol if your health care provider tells you not to  drink. If you drink alcohol: Limit how much you have to 0-1 drink a day. Be aware of how much alcohol is in your drink. In the [place] one drink equals one 12 oz  bottle of beer (355 mL), one 5 oz glass of wine (148 mL), or one 1 oz glass of hard liquor (44 mL).  Lifestyle Take daily care of your teeth and gums. Brush your teeth every morning and night with fluoride toothpaste. Floss one time each day. Stay active. Exercise for at least 30 minutes 5 or more days each week. Do not use any products that contain nicotine or tobacco, such as cigarettes, e-cigarettes, and chewing tobacco. If you need help quitting, ask your health care provider. Do not use drugs. If you are sexually active, practice safe sex. Use a condom or other form of protection in order to prevent STIs (sexually transmitted infections). Talk with your health care provider about taking a low-dose aspirin or statin. Find healthy ways to cope with stress, such as: Meditation, yoga, or listening to music. Journaling. Talking to a trusted person. Spending time with friends and family. Safety Always wear your seat belt while driving or riding in a vehicle. Do not drive: If you have been drinking alcohol. Do not ride with someone who has been drinking. When you are tired or distracted. While texting. Wear a helmet and other protective equipment during sports activities. If you have firearms in your house, make sure you follow all gun safety procedures. What's next? Visit your health care provider once a year for an annual wellness visit. Ask your health care provider how often you should have your eyes and teeth checked. Stay up to date on all vaccines. This information is not intended to replace advice given to you by your health care provider. Make sure you discuss any questions you have with your healthcare provider. Document Revised: 09/29/2020 Document Reviewed: 10/03/2018 Elsevier Patient Education  2022 [employer].

## 2021-06-14 NOTE — Assessment & Plan Note (Signed)
Encouraged to get adequate exercise, calcium and vitamin d intake 

## 2021-06-15 DIAGNOSIS — M25551 Pain in right hip: Secondary | ICD-10-CM | POA: Insufficient documentation

## 2021-06-15 DIAGNOSIS — L719 Rosacea, unspecified: Secondary | ICD-10-CM

## 2021-06-15 HISTORY — DX: Rosacea, unspecified: L71.9

## 2021-06-15 LAB — CBC WITH DIFFERENTIAL/PLATELET
Basophils Absolute: 0.1 10*3/uL (ref 0.0–0.1)
Basophils Relative: 1.1 % (ref 0.0–3.0)
Eosinophils Absolute: 0.1 10*3/uL (ref 0.0–0.7)
Eosinophils Relative: 1.9 % (ref 0.0–5.0)
HCT: 37 % (ref 36.0–46.0)
Hemoglobin: 12.1 g/dL (ref 12.0–15.0)
Lymphocytes Relative: 21.2 % (ref 12.0–46.0)
Lymphs Abs: 1.2 10*3/uL (ref 0.7–4.0)
MCHC: 32.8 g/dL (ref 30.0–36.0)
MCV: 96.4 fl (ref 78.0–100.0)
Monocytes Absolute: 0.8 10*3/uL (ref 0.1–1.0)
Monocytes Relative: 14 % — ABNORMAL HIGH (ref 3.0–12.0)
Neutro Abs: 3.6 10*3/uL (ref 1.4–7.7)
Neutrophils Relative %: 61.8 % (ref 43.0–77.0)
Platelets: 181 10*3/uL (ref 150.0–400.0)
RBC: 3.84 Mil/uL — ABNORMAL LOW (ref 3.87–5.11)
RDW: 13.2 % (ref 11.5–15.5)
WBC: 5.9 10*3/uL (ref 4.0–10.5)

## 2021-06-15 LAB — LIPID PANEL
Cholesterol: 174 mg/dL (ref 0–200)
HDL: 79.4 mg/dL (ref >39.00)
LDL Cholesterol: 73 mg/dL (ref 0–99)
NonHDL: 95.07
Total CHOL/HDL Ratio: 2
Triglycerides: 110 mg/dL (ref 0.0–149.0)
VLDL: 22 mg/dL (ref 0.0–40.0)

## 2021-06-15 LAB — HEMOGLOBIN A1C: Hgb A1c MFr Bld: 6.4 % (ref 4.6–6.5)

## 2021-06-15 LAB — TSH: TSH: 1.29 u[IU]/mL (ref 0.35–5.50)

## 2021-06-15 NOTE — Assessment & Plan Note (Signed)
Tolerating statin, encouraged heart healthy diet, avoid trans fats, minimize simple carbs and saturated fats. Increase exercise as tolerated 

## 2021-06-15 NOTE — Assessment & Plan Note (Signed)
hgba1c acceptable, minimize simple carbs. Increase exercise as tolerated.  

## 2021-06-15 NOTE — Assessment & Plan Note (Signed)
Well controlled, no changes to meds. Encouraged heart healthy diet such as the DASH diet and exercise as tolerated.  °

## 2021-06-15 NOTE — Assessment & Plan Note (Signed)
Xray of hip ordered, started on Tizanidine 1-2 mg qhs prn, Medrol dosepak and referred to Sports Medicine

## 2021-06-15 NOTE — Assessment & Plan Note (Signed)
Consider Rosacea and follow up with dermatology

## 2021-06-15 NOTE — Assessment & Plan Note (Signed)
On Levothyroxine, continue to monitor 

## 2021-06-16 ENCOUNTER — Other Ambulatory Visit: Payer: Self-pay

## 2021-06-16 ENCOUNTER — Ambulatory Visit: Payer: PPO | Admitting: Family Medicine

## 2021-06-16 ENCOUNTER — Ambulatory Visit: Payer: Self-pay

## 2021-06-16 ENCOUNTER — Encounter: Payer: Self-pay | Admitting: Family Medicine

## 2021-06-16 VITALS — BP 120/70 | Ht 62.0 in | Wt 211.0 lb

## 2021-06-16 DIAGNOSIS — M25551 Pain in right hip: Secondary | ICD-10-CM

## 2021-06-16 LAB — VITAMIN D 1,25 DIHYDROXY
Vitamin D 1, 25 (OH)2 Total: 42 pg/mL (ref 18–72)
Vitamin D2 1, 25 (OH)2: <8 pg/mL
Vitamin D3 1, 25 (OH)2: 42 pg/mL

## 2021-06-16 MED ORDER — TRIAMCINOLONE ACETONIDE 40 MG/ML IJ SUSP
40.0000 mg | Freq: Once | INTRAMUSCULAR | Status: AC
  Administered 2021-06-16 (×2): 40 mg via INTRA_ARTICULAR

## 2021-06-16 NOTE — Patient Instructions (Signed)
Nice to meet you Please try ice and heat  Please try the exercises   Please send me a message in MyChart with any questions or updates.  Please see me back in 4 weeks.   --Dr. Raeford Razor

## 2021-06-16 NOTE — Assessment & Plan Note (Signed)
Having pain laterally and posteriorly.  Seems more associated with the gluteus medius as opposed to the SI joint or the hip joint. -Counseled on home exercise therapy and supportive care. -Injection today. -Could consider physical therapy

## 2021-06-16 NOTE — Progress Notes (Signed)
Wendy Munoz - 80 y.o. female MRN [id number]  Date of birth: [birth year]  SUBJECTIVE:  Including CC & ROS.  No chief complaint on file.   Wendy Munoz is a 80 y.o. female that is presenting with right lateral hip pain.  The pain has been ongoing for few days.  Pain is worse with walking..  Independent review of the right hip x-ray from 8/23 shows no significant changes of the hip joint.   Review of Systems See HPI   HISTORY: Past Medical, Surgical, Social, and Family History Reviewed & Updated per EMR.   Pertinent Historical Findings include:  Past Medical History:  Diagnosis Date   Allergic state 06/26/2017   Anemia 05/21/2017   Arthritis    Atrial tachycardia ([place])    a. asymptomatic. Noted on tele during 11/2014 admission    CAD (coronary artery disease)    a. 11/2014 inf STEMI s/p DES to RCA; 50-70% ruptured plaque in pLAD- rx medically   GERD (gastroesophageal reflux disease)    HTN (hypertension)    Hyperglycemia    Hyperlipidemia    Hypothyroidism    Insomnia 05/27/2017   Ischemic cardiomyopathy    a. 11/2014  LV gram with inferior hypokinesis. EF 45-50%.   Nocturia 01/08/2017   Obesity 06/26/2017   Osteoporosis    PAF (paroxysmal atrial fibrillation) ([place])    a. isolated episode of atrial fibrillation several years ago associated w/ her thyroid dysfunction (prior to ablation).     Prediabetes    Preventative health care 06/26/2017   Vasomotor rhinitis 06/26/2017    Past Surgical History:  Procedure Laterality Date   BREAST SURGERY     breast biospy   CATARACT EXTRACTION Bilateral    COLONOSCOPY     EYE SURGERY Bilateral 2005, 2006   Cataract with lens ImplaNT    INGUINAL HERNIA REPAIR Right    LEFT HEART CATHETERIZATION WITH CORONARY ANGIOGRAM N/A 12/02/2014   Procedure: LEFT HEART CATHETERIZATION WITH CORONARY ANGIOGRAM;  Surgeon: Sinclair Grooms, MD;  Location: [facility] CATH LAB;  Service: Cardiovascular;  Laterality: N/A;   ORIF HUMERUS FRACTURE Right 01/27/2014    Procedure: RIGHT OPEN REDUCTION INTERNAL FIXATION (ORIF) PROXIMAL HUMERUS FRACTURE;  Surgeon: Rozanna Box, MD;  Location: [place];  Service: Orthopedics;  Laterality: Right;   TOTAL KNEE ARTHROPLASTY Right 2006   TOTAL KNEE ARTHROPLASTY Left 6144   UMBILICAL HERNIA REPAIR     as a baby    Family History  Problem Relation Age of Onset   CAD Mother    Heart disease Mother    Heart failure Mother    Polycythemia Father    Multiple myeloma Father    Cancer Sister        breast cancer   Stroke Sister    Hyperlipidemia Sister    Hypertension Sister    Breast cancer Sister 88   Neuropathy Sister    Heart disease Brother        bradycardia   Hypertension Brother    Atrial fibrillation Daughter    Arthritis Maternal Aunt    Breast cancer Maternal Aunt    Lung cancer Paternal Grandmother    Prostate cancer Paternal Grandfather    Diabetes Paternal Grandfather    Breast cancer Other     Social History   Socioeconomic History   Marital status: Married    Spouse name: Not on file   Number of children: Not on file   Years of education: Not on file  Highest education level: Not on file  Occupational History   Occupation: retired   Tobacco Use   Smoking status: Never   Smokeless tobacco: Never  Vaping Use   Vaping Use: Never used  Substance and Sexual Activity   Alcohol use: No    Alcohol/week: 0.0 standard drinks   Drug use: No   Sexual activity: Yes    Partners: Male  Other Topics Concern   Not on file  Social History Narrative   Not on file   Social Determinants of Health   Financial Resource Strain: Not on file  Food Insecurity: Not on file  Transportation Needs: Not on file  Physical Activity: Not on file  Stress: Not on file  Social Connections: Not on file  Intimate Partner Violence: Not on file     PHYSICAL EXAM:  VS: BP 120/70 (BP Location: Left Arm, Patient Position: Sitting, Cuff Size: Normal)   Ht 5' 2" (1.575 m)   Wt 211 lb (95.7 kg)   BMI  38.59 kg/m  Physical Exam Gen: NAD, alert, cooperative with exam, well-appearing MSK:  Right hip: Tenderness to palpation of the lateral trochanter. Normal range of motion. Normal strength resistance. Neurovascularly intact   Aspiration/Injection Procedure Note Sung M Argote 11/23/1940  Procedure: Injection Indications: Right hip pain  Procedure Details Consent: Risks of procedure as well as the alternatives and risks of each were explained to the (patient/caregiver).  Consent for procedure obtained. Time Out: Verified patient identification, verified procedure, site/side was marked, verified correct patient position, special equipment/implants available, medications/allergies/relevent history reviewed, required imaging and test results available.  Performed.  The area was cleaned with iodine and alcohol swabs.    The right lateral trochanteric bursa was injected using 3 cc of 1% lidocaine on a 22-gauge 3-1/2 inch needle.  The syringe was switched and a mixture containing 1 cc's of 40 mg Depo-Medrol and 4 cc's of 0.25% bupivacaine was injected.  Ultrasound was used. Images were obtained in short views showing the injection.     A sterile dressing was applied.  Patient did tolerate procedure well.     ASSESSMENT & PLAN:   Greater trochanteric pain syndrome of right lower extremity Having pain laterally and posteriorly.  Seems more associated with the gluteus medius as opposed to the SI joint or the hip joint. -Counseled on home exercise therapy and supportive care. -Injection today. -Could consider physical therapy     

## 2021-06-17 ENCOUNTER — Telehealth: Payer: Self-pay | Admitting: Interventional Cardiology

## 2021-06-17 NOTE — Telephone Encounter (Signed)
I would recommend tylenolol or a topical pain creame (voltaren, asper cream, icy hot) over IBU.  If she is to take IBU she should be aware that it can increase her risk of bleeding. Use should be at low doses and for a short period of time.

## 2021-06-17 NOTE — Telephone Encounter (Signed)
Spoke with pt and reviewed recommendations.  Pt agreeable to try topical first.  Pt appreciative for call.

## 2021-06-17 NOTE — Telephone Encounter (Signed)
New Message:      Patient says she is suffering with Arthritidis for over 2 weeks. She wants to  to know if she can take a limited amount of Ibuprofen. She is concerned about taking it with her Eliquis. Will this be alright   Pt c/o medication issue:  1. Name of Medication: Eliquis  2. How are you currently taking this medication (dosage and times per day)?  2 times a day  3. Are you having a reaction (difficulty breathing--STAT)? no  4. What is your medication issue? Can she take Ibuprofen with her Eliquis

## 2021-06-18 ENCOUNTER — Encounter: Payer: Self-pay | Admitting: Family Medicine

## 2021-06-20 ENCOUNTER — Other Ambulatory Visit: Payer: Self-pay | Admitting: Family Medicine

## 2021-06-20 ENCOUNTER — Other Ambulatory Visit: Payer: Self-pay

## 2021-06-20 MED ORDER — METRONIDAZOLE 1 % EX GEL: Freq: Every day | CUTANEOUS | 2 refills | Status: DC

## 2021-06-24 ENCOUNTER — Telehealth: Payer: Self-pay | Admitting: Family Medicine

## 2021-06-24 NOTE — Telephone Encounter (Signed)
Patient called in regards of her leg/hip pain that has not gotten any better. She tried calling her sports medicine MD but their office was closed already. She then called her cardiologist to see if she could get any pain meds from them but they advised her to call her PCP for anything other than the ibuprofen they are letting her take for 4-5 days. She was offered to come in to see a provider but she declined. She has an appointment with her sports medicine MD on Wednesday but does not think she'll last that long without any time of pain medicine. She would like Wendy Munoz to prescribed her something for her pain that wont mess with her heart medicine. Please advice.

## 2021-06-27 ENCOUNTER — Other Ambulatory Visit: Payer: Self-pay | Admitting: Family Medicine

## 2021-06-27 MED ORDER — TRAMADOL HCL 50 MG PO TABS: 50.0000 mg | ORAL_TABLET | Freq: Two times a day (BID) | ORAL | 0 refills | Status: DC | PRN

## 2021-06-28 ENCOUNTER — Telehealth: Payer: Self-pay

## 2021-06-28 NOTE — Telephone Encounter (Signed)
Patient called and states the tramadol she is taking as requested 12 hours apart. However her BP dropped to 93/43. She states she is also on eliquis and metoprolol. She is unsure if she can take the tramadol but she is in severe pain. She has taken 3,000 mg of tylenol over the course of the weekend with no improvement.   She has an appointment with sports med Tomorrow, Her last visit she was given Triamcinolone Acetonide 40 mg injection for the pain but has not helped any. Please advise on further instruction.

## 2021-06-28 NOTE — Telephone Encounter (Signed)
Pt states that she has taking the tramadol sometime last week and it dropped her bp down to 93/43 and she felt a little faint. Pt was wondering if she could be prescribed something different.

## 2021-06-29 ENCOUNTER — Ambulatory Visit (HOSPITAL_BASED_OUTPATIENT_CLINIC_OR_DEPARTMENT_OTHER)
Admission: RE | Admit: 2021-06-29 | Discharge: 2021-06-29 | Disposition: A | Discharge status: Home / Self Care | Payer: PPO | Source: Ambulatory Visit | Attending: Family Medicine | Admitting: Family Medicine

## 2021-06-29 ENCOUNTER — Ambulatory Visit: Payer: PPO | Admitting: Family Medicine

## 2021-06-29 ENCOUNTER — Other Ambulatory Visit: Payer: Self-pay

## 2021-06-29 ENCOUNTER — Encounter: Payer: Self-pay | Admitting: Family Medicine

## 2021-06-29 VITALS — Ht 62.0 in | Wt 211.0 lb

## 2021-06-29 DIAGNOSIS — G834 Cauda equina syndrome: Secondary | ICD-10-CM | POA: Insufficient documentation

## 2021-06-29 DIAGNOSIS — M8448XA Pathological fracture, other site, initial encounter for fracture: Secondary | ICD-10-CM

## 2021-06-29 DIAGNOSIS — M545 Low back pain, unspecified: Secondary | ICD-10-CM | POA: Diagnosis not present

## 2021-06-29 DIAGNOSIS — M546 Pain in thoracic spine: Secondary | ICD-10-CM | POA: Diagnosis not present

## 2021-06-29 HISTORY — DX: Pathological fracture, other site, initial encounter for fracture: M84.48XA

## 2021-06-29 MED ORDER — HYDROCODONE-ACETAMINOPHEN 5-325 MG PO TABS: 1.0000 | ORAL_TABLET | Freq: Three times a day (TID) | ORAL | 0 refills | Status: DC | PRN

## 2021-06-29 NOTE — Progress Notes (Signed)
Wendy Munoz - 80 y.o. female MRN [id number]  Date of birth: Feb 24, 1941  SUBJECTIVE:  Including CC & ROS.  No chief complaint on file.   Wendy Munoz is a 80 y.o. female that is presenting with worsening of her pain.  We tried a greater trochanteric injection with limited improvement.  She is now currently having severe pain in the gluteus bilaterally.  It is occurring with any weightbearing.  Denies any injury or inciting event.   Review of Systems See HPI   HISTORY: Past Medical, Surgical, Social, and Family History Reviewed & Updated per EMR.   Pertinent Historical Findings include:  Past Medical History:  Diagnosis Date   Allergic state 06/26/2017   Anemia 05/21/2017   Arthritis    Atrial tachycardia ([place])    a. asymptomatic. Noted on tele during 11/2014 admission    CAD (coronary artery disease)    a. 11/2014 inf STEMI s/p DES to RCA; 50-70% ruptured plaque in pLAD- rx medically   GERD (gastroesophageal reflux disease)    HTN (hypertension)    Hyperglycemia    Hyperlipidemia    Hypothyroidism    Insomnia 05/27/2017   Ischemic cardiomyopathy    a. 11/2014  LV gram with inferior hypokinesis. EF 45-50%.   Nocturia 01/08/2017   Obesity 06/26/2017   Osteoporosis    PAF (paroxysmal atrial fibrillation) ([place])    a. isolated episode of atrial fibrillation several years ago associated w/ her thyroid dysfunction (prior to ablation).     Prediabetes    Preventative health care 06/26/2017   Vasomotor rhinitis 06/26/2017    Past Surgical History:  Procedure Laterality Date   BREAST SURGERY     breast biospy   CATARACT EXTRACTION Bilateral    COLONOSCOPY     EYE SURGERY Bilateral 2005, 2006   Cataract with lens ImplaNT    INGUINAL HERNIA REPAIR Right    LEFT HEART CATHETERIZATION WITH CORONARY ANGIOGRAM N/A 12/02/2014   Procedure: LEFT HEART CATHETERIZATION WITH CORONARY ANGIOGRAM;  Surgeon: Sinclair Grooms, MD;  Location: [facility] CATH LAB;  Service: Cardiovascular;  Laterality: N/A;    ORIF HUMERUS FRACTURE Right 01/27/2014   Procedure: RIGHT OPEN REDUCTION INTERNAL FIXATION (ORIF) PROXIMAL HUMERUS FRACTURE;  Surgeon: Rozanna Box, MD;  Location: [place];  Service: Orthopedics;  Laterality: Right;   TOTAL KNEE ARTHROPLASTY Right 2006   TOTAL KNEE ARTHROPLASTY Left 4854   UMBILICAL HERNIA REPAIR     as a baby    Family History  Problem Relation Age of Onset   CAD Mother    Heart disease Mother    Heart failure Mother    Polycythemia Father    Multiple myeloma Father    Cancer Sister        breast cancer   Stroke Sister    Hyperlipidemia Sister    Hypertension Sister    Breast cancer Sister 49   Neuropathy Sister    Heart disease Brother        bradycardia   Hypertension Brother    Atrial fibrillation Daughter    Arthritis Maternal Aunt    Breast cancer Maternal Aunt    Lung cancer Paternal Grandmother    Prostate cancer Paternal Grandfather    Diabetes Paternal Grandfather    Breast cancer Other     Social History   Socioeconomic History   Marital status: Married    Spouse name: Not on file   Number of children: Not on file   Years of education: Not on  file   Highest education level: Not on file  Occupational History   Occupation: retired   Tobacco Use   Smoking status: Never   Smokeless tobacco: Never  Vaping Use   Vaping Use: Never used  Substance and Sexual Activity   Alcohol use: No    Alcohol/week: 0.0 standard drinks   Drug use: No   Sexual activity: Yes    Partners: Male  Other Topics Concern   Not on file  Social History Narrative   Not on file   Social Determinants of Health   Financial Resource Strain: Not on file  Food Insecurity: Not on file  Transportation Needs: Not on file  Physical Activity: Not on file  Stress: Not on file  Social Connections: Not on file  Intimate Partner Violence: Not on file     PHYSICAL EXAM:  VS: Ht 5' 2"  (1.575 m)   Wt 211 lb (95.7 kg)   BMI 38.59 kg/m  Physical Exam Gen: NAD, alert,  cooperative with exam, well-appearing MSK:  Back: Weakness with hip flexion. Positive straight leg raise. Weakness with resistance to plantar and dorsiflexion. Neurovascular intact     ASSESSMENT & PLAN:   Cauda equina syndrome (HCC) Symptoms are concerning for either cauda equina versus a compression fracture versus referred pain from the SI joint or facet joints. -Counseled on home exercise therapy and supportive care. -X-ray of the lumbar and thoracic spine. - Norco. -MRI of the lumbar spine to evaluate for cord compression

## 2021-06-29 NOTE — Telephone Encounter (Signed)
LVM to call back in

## 2021-06-29 NOTE — Patient Instructions (Signed)
Good to see you Please try heat  Please use the norco for severe pain.  I will call with the xray results.   Please send me a message in MyChart with any questions or updates.  We will setup a virtual visit once the MRI is resulted.   --Dr. Raeford Razor

## 2021-06-29 NOTE — Assessment & Plan Note (Signed)
Symptoms are concerning for either cauda equina versus a compression fracture versus referred pain from the SI joint or facet joints. -Counseled on home exercise therapy and supportive care. -X-ray of the lumbar and thoracic spine. - Norco. -MRI of the lumbar spine to evaluate for cord compression

## 2021-06-30 NOTE — Addendum Note (Signed)
Addended by: Cresenciano Lick on: 06/30/2021 08:34 AM   Modules accepted: Orders

## 2021-06-30 NOTE — Telephone Encounter (Signed)
Pt aware.

## 2021-07-02 ENCOUNTER — Ambulatory Visit (HOSPITAL_BASED_OUTPATIENT_CLINIC_OR_DEPARTMENT_OTHER)
Admission: RE | Admit: 2021-07-02 | Discharge: 2021-07-02 | Disposition: A | Discharge status: Home / Self Care | Payer: PPO | Source: Ambulatory Visit | Attending: Family Medicine | Admitting: Family Medicine

## 2021-07-02 ENCOUNTER — Other Ambulatory Visit: Payer: Self-pay

## 2021-07-02 DIAGNOSIS — G834 Cauda equina syndrome: Secondary | ICD-10-CM | POA: Insufficient documentation

## 2021-07-02 DIAGNOSIS — M47816 Spondylosis without myelopathy or radiculopathy, lumbar region: Secondary | ICD-10-CM | POA: Diagnosis not present

## 2021-07-02 DIAGNOSIS — S3219XA Other fracture of sacrum, initial encounter for closed fracture: Secondary | ICD-10-CM | POA: Diagnosis not present

## 2021-07-02 DIAGNOSIS — M48061 Spinal stenosis, lumbar region without neurogenic claudication: Secondary | ICD-10-CM | POA: Diagnosis not present

## 2021-07-04 ENCOUNTER — Telehealth: Payer: Self-pay | Admitting: Family Medicine

## 2021-07-04 NOTE — Telephone Encounter (Signed)
Informed of results.  Compression fracture was observed at T5.  She does have a history of osteopenia so qualifies as osteoporosis.  Discussed about starting osteoporosis medication.  Having significant arthritis within the facet joints.  Awaiting MRI for the consideration of facet injections versus epidural.  Rosemarie Ax, MD [facility] 07/04/2021, 9:32 AM

## 2021-07-05 ENCOUNTER — Telehealth: Payer: Self-pay | Admitting: *Deleted

## 2021-07-05 ENCOUNTER — Encounter: Payer: Self-pay | Admitting: Family Medicine

## 2021-07-05 ENCOUNTER — Other Ambulatory Visit: Payer: Self-pay

## 2021-07-05 ENCOUNTER — Telehealth (INDEPENDENT_AMBULATORY_CARE_PROVIDER_SITE_OTHER): Payer: PPO | Admitting: Family Medicine

## 2021-07-05 DIAGNOSIS — M8448XA Pathological fracture, other site, initial encounter for fracture: Secondary | ICD-10-CM | POA: Diagnosis not present

## 2021-07-05 MED ORDER — CALCITONIN (SALMON) 200 UNIT/ACT NA SOLN: 1.0000 | Freq: Every day | NASAL | 11 refills | Status: DC

## 2021-07-05 MED ORDER — HYDROCODONE-ACETAMINOPHEN 5-325 MG PO TABS: 1.0000 | ORAL_TABLET | Freq: Three times a day (TID) | ORAL | 0 refills | Status: DC | PRN

## 2021-07-05 NOTE — Assessment & Plan Note (Signed)
Found on MRI of the lumbar spine.  Continues to have severe pain in the gluteus region.  Likely component of her osteoporosis that has led to this fracture. -Counseled on home exercise therapy and supportive care. -Pursue pelvic binder. -Norco. -Calcitonin.

## 2021-07-05 NOTE — Telephone Encounter (Signed)
Calcitonin nasal spray PA initiated via CoverMyMeds.   Key: [id number]  PA Case ID: [id number]

## 2021-07-05 NOTE — Progress Notes (Signed)
Virtual Visit via Telephone Note  I connected with Wendy Munoz on 07/05/21 at  2:50 PM EDT by telephone and verified that I am speaking with the correct person using two identifiers.  Location: Patient: home Provider: office   I discussed the limitations, risks, security and privacy concerns of performing an evaluation and management service by telephone and the availability of in person appointments. I also discussed with the patient that there may be a patient responsible charge related to this service. The patient expressed understanding and agreed to proceed.   History of Present Illness:  Wendy Munoz is a 80 year old female is following up after the MRI of her lumbar spine.  This was revealing for insufficiency fractures of the sacrum.  She continues to have ongoing pain.   Observations/Objective:   Assessment and Plan:  Bilateral insufficiency sacral fracture Found on MRI of the lumbar spine.  Continues to have severe pain in the gluteus region.  Likely component of her osteoporosis that has led to this fracture. -Counseled on home exercise therapy and supportive care. -Pursue pelvic binder. -Norco. -Calcitonin.  Follow Up Instructions:    I discussed the assessment and treatment plan with the patient. The patient was provided an opportunity to ask questions and all were answered. The patient agreed with the plan and demonstrated an understanding of the instructions.   The patient was advised to call back or seek an in-person evaluation if the symptoms worsen or if the condition fails to improve as anticipated.  I provided 8 minutes of non-face-to-face time during this encounter.   Clearance Coots, MD

## 2021-07-06 ENCOUNTER — Other Ambulatory Visit: Payer: Self-pay | Admitting: Family Medicine

## 2021-07-06 NOTE — Telephone Encounter (Signed)
Received fax from [place] stated PA for Calcitonin - [place] 200 units spray is approved until 07/04/2121.

## 2021-07-08 ENCOUNTER — Telehealth: Payer: Self-pay | Admitting: Family Medicine

## 2021-07-08 DIAGNOSIS — S336XXA Sprain of sacroiliac joint, initial encounter: Secondary | ICD-10-CM | POA: Diagnosis not present

## 2021-07-08 NOTE — Telephone Encounter (Signed)
Informed we'll try a compression at [facility] to see which one feels most comfortable to help with her pain.   Rosemarie Ax, MD [facility] 07/08/2021, 9:00 AM

## 2021-07-09 ENCOUNTER — Ambulatory Visit (HOSPITAL_BASED_OUTPATIENT_CLINIC_OR_DEPARTMENT_OTHER)
Admission: RE | Admit: 2021-07-09 | Discharge: 2021-07-09 | Disposition: A | Discharge status: Home / Self Care | Payer: PPO | Source: Ambulatory Visit | Attending: Family Medicine | Admitting: Family Medicine

## 2021-07-09 ENCOUNTER — Other Ambulatory Visit: Payer: Self-pay

## 2021-07-09 DIAGNOSIS — K5730 Diverticulosis of large intestine without perforation or abscess without bleeding: Secondary | ICD-10-CM | POA: Diagnosis not present

## 2021-07-09 DIAGNOSIS — M8448XA Pathological fracture, other site, initial encounter for fracture: Secondary | ICD-10-CM | POA: Diagnosis not present

## 2021-07-09 DIAGNOSIS — N888 Other specified noninflammatory disorders of cervix uteri: Secondary | ICD-10-CM | POA: Diagnosis not present

## 2021-07-09 DIAGNOSIS — S3210XA Unspecified fracture of sacrum, initial encounter for closed fracture: Secondary | ICD-10-CM | POA: Diagnosis not present

## 2021-07-09 DIAGNOSIS — M533 Sacrococcygeal disorders, not elsewhere classified: Secondary | ICD-10-CM | POA: Diagnosis not present

## 2021-07-12 ENCOUNTER — Telehealth (INDEPENDENT_AMBULATORY_CARE_PROVIDER_SITE_OTHER): Payer: PPO | Admitting: Family Medicine

## 2021-07-12 ENCOUNTER — Other Ambulatory Visit: Payer: Self-pay

## 2021-07-12 DIAGNOSIS — M8448XA Pathological fracture, other site, initial encounter for fracture: Secondary | ICD-10-CM

## 2021-07-12 MED ORDER — HYDROCODONE-ACETAMINOPHEN 5-325 MG PO TABS: 1.0000 | ORAL_TABLET | Freq: Three times a day (TID) | ORAL | 0 refills | Status: DC | PRN

## 2021-07-12 NOTE — Assessment & Plan Note (Signed)
MRI of the pelvis was again demonstrating the insufficiency fractures and no other pathology.  Her pain has improved with the SI joint belt but still occurring more on the left side today. -Counseled on home exercise therapy and supportive care. -Refilled Norco. -Counseled on calcitonin. -Can consider physical therapy going forward.

## 2021-07-12 NOTE — Progress Notes (Signed)
Virtual Visit via Telephone Note  I connected with Wendy Munoz on 07/12/21 at  2:30 PM EDT by telephone and verified that I am speaking with the correct person using two identifiers.  Location: Patient: home Provider: office   I discussed the limitations, risks, security and privacy concerns of performing an evaluation and management service by telephone and the availability of in person appointments. I also discussed with the patient that there may be a patient responsible charge related to this service. The patient expressed understanding and agreed to proceed.   History of Present Illness:  Ms. Wendy Munoz is a 80 year old female that has following up after the MRI of her pelvis.  This was demonstrating the bilateral sacral insufficiency fractures.  Her pain is more on just the left side currently.  No radicular component or altered sensation.   Observations/Objective:   Assessment and Plan:  Bilateral sacral insufficiency fracture: MRI of the pelvis was again demonstrating the insufficiency fractures and no other pathology.  Her pain has improved with the SI joint belt but still occurring more on the left side today. -Counseled on home exercise therapy and supportive care. -Refilled Norco. -Counseled on calcitonin. -Can consider physical therapy going forward.  Follow Up Instructions:    I discussed the assessment and treatment plan with the patient. The patient was provided an opportunity to ask questions and all were answered. The patient agreed with the plan and demonstrated an understanding of the instructions.   The patient was advised to call back or seek an in-person evaluation if the symptoms worsen or if the condition fails to improve as anticipated.  I provided 7 minutes of non-face-to-face time during this encounter.   Clearance Coots, MD

## 2021-07-13 ENCOUNTER — Other Ambulatory Visit: Payer: Self-pay | Admitting: Family Medicine

## 2021-07-20 ENCOUNTER — Ambulatory Visit: Payer: PPO | Admitting: Family Medicine

## 2021-07-20 ENCOUNTER — Encounter: Payer: Self-pay | Admitting: Family Medicine

## 2021-07-20 VITALS — Ht 62.0 in | Wt 211.0 lb

## 2021-07-20 DIAGNOSIS — M8448XD Pathological fracture, other site, subsequent encounter for fracture with routine healing: Secondary | ICD-10-CM | POA: Diagnosis not present

## 2021-07-20 DIAGNOSIS — M8448XA Pathological fracture, other site, initial encounter for fracture: Secondary | ICD-10-CM

## 2021-07-20 DIAGNOSIS — M8000XD Age-related osteoporosis with current pathological fracture, unspecified site, subsequent encounter for fracture with routine healing: Secondary | ICD-10-CM | POA: Diagnosis not present

## 2021-07-20 MED ORDER — HYDROCODONE-ACETAMINOPHEN 5-325 MG PO TABS: 1.0000 | ORAL_TABLET | Freq: Three times a day (TID) | ORAL | 0 refills | Status: DC | PRN

## 2021-07-20 NOTE — Patient Instructions (Signed)
Good to see you Please try physical therapy  Please consider the different bone medications that we can start   Please send me a message in MyChart with any questions or updates.  Please check in 4 weeks and we can do a virtual visit.   --Dr. Raeford Razor

## 2021-07-20 NOTE — Assessment & Plan Note (Signed)
Slowly getting improvement of her pain. -Counseled on home exercise therapy and supportive care. -Referral to physical therapy. -Counseled on calcitonin. -Refilled Norco.

## 2021-07-20 NOTE — Progress Notes (Signed)
LINZIE CRISS - 80 y.o. female MRN [id number]  Date of birth: 09/26/1941  SUBJECTIVE:  Including CC & ROS.  No chief complaint on file.   KIERSTYN BARANOWSKI is a 80 y.o. female that is following up for her low back pain.  Having improvement with the orthosis.  She is still using the pain medication from time to time.  Does not have as much pain with ambulation.    Review of Systems See HPI   HISTORY: Past Medical, Surgical, Social, and Family History Reviewed & Updated per EMR.   Pertinent Historical Findings include:  Past Medical History:  Diagnosis Date   Allergic state 06/26/2017   Anemia 05/21/2017   Arthritis    Atrial tachycardia ([place])    a. asymptomatic. Noted on tele during 11/2014 admission    CAD (coronary artery disease)    a. 11/2014 inf STEMI s/p DES to RCA; 50-70% ruptured plaque in pLAD- rx medically   GERD (gastroesophageal reflux disease)    HTN (hypertension)    Hyperglycemia    Hyperlipidemia    Hypothyroidism    Insomnia 05/27/2017   Ischemic cardiomyopathy    a. 11/2014  LV gram with inferior hypokinesis. EF 45-50%.   Nocturia 01/08/2017   Obesity 06/26/2017   Osteoporosis    PAF (paroxysmal atrial fibrillation) ([place])    a. isolated episode of atrial fibrillation several years ago associated w/ her thyroid dysfunction (prior to ablation).     Prediabetes    Preventative health care 06/26/2017   Vasomotor rhinitis 06/26/2017    Past Surgical History:  Procedure Laterality Date   BREAST SURGERY     breast biospy   CATARACT EXTRACTION Bilateral    COLONOSCOPY     EYE SURGERY Bilateral 2005, 2006   Cataract with lens ImplaNT    INGUINAL HERNIA REPAIR Right    LEFT HEART CATHETERIZATION WITH CORONARY ANGIOGRAM N/A 12/02/2014   Procedure: LEFT HEART CATHETERIZATION WITH CORONARY ANGIOGRAM;  Surgeon: Sinclair Grooms, MD;  Location: [facility] CATH LAB;  Service: Cardiovascular;  Laterality: N/A;   ORIF HUMERUS FRACTURE Right 01/27/2014   Procedure: RIGHT OPEN REDUCTION  INTERNAL FIXATION (ORIF) PROXIMAL HUMERUS FRACTURE;  Surgeon: Rozanna Box, MD;  Location: [place];  Service: Orthopedics;  Laterality: Right;   TOTAL KNEE ARTHROPLASTY Right 2006   TOTAL KNEE ARTHROPLASTY Left 6283   UMBILICAL HERNIA REPAIR     as a baby    Family History  Problem Relation Age of Onset   CAD Mother    Heart disease Mother    Heart failure Mother    Polycythemia Father    Multiple myeloma Father    Cancer Sister        breast cancer   Stroke Sister    Hyperlipidemia Sister    Hypertension Sister    Breast cancer Sister 12   Neuropathy Sister    Heart disease Brother        bradycardia   Hypertension Brother    Atrial fibrillation Daughter    Arthritis Maternal Aunt    Breast cancer Maternal Aunt    Lung cancer Paternal Grandmother    Prostate cancer Paternal Grandfather    Diabetes Paternal Grandfather    Breast cancer Other     Social History   Socioeconomic History   Marital status: Married    Spouse name: Not on file   Number of children: Not on file   Years of education: Not on file   Highest education level: Not  on file  Occupational History   Occupation: retired   Tobacco Use   Smoking status: Never   Smokeless tobacco: Never  Vaping Use   Vaping Use: Never used  Substance and Sexual Activity   Alcohol use: No    Alcohol/week: 0.0 standard drinks   Drug use: No   Sexual activity: Yes    Partners: Male  Other Topics Concern   Not on file  Social History Narrative   Not on file   Social Determinants of Health   Financial Resource Strain: Not on file  Food Insecurity: Not on file  Transportation Needs: Not on file  Physical Activity: Not on file  Stress: Not on file  Social Connections: Not on file  Intimate Partner Violence: Not on file     PHYSICAL EXAM:  VS: Ht 5' 2"  (1.575 m)   Wt 211 lb (95.7 kg)   BMI 38.59 kg/m  Physical Exam Gen: NAD, alert, cooperative with exam, well-appearing      ASSESSMENT & PLAN:    Bilateral sacral insufficiency fracture Slowly getting improvement of her pain. -Counseled on home exercise therapy and supportive care. -Referral to physical therapy. -Counseled on calcitonin. -Refilled Norco.   Osteoporosis Discussed different treatment today.  She needs to be started on something to help with healing process of the fracture.  -We will continue to discuss her options going forward.

## 2021-07-20 NOTE — Assessment & Plan Note (Signed)
Discussed different treatment today.  She needs to be started on something to help with healing process of the fracture.  -We will continue to discuss her options going forward.

## 2021-07-28 DIAGNOSIS — M5459 Other low back pain: Secondary | ICD-10-CM | POA: Diagnosis not present

## 2021-07-28 DIAGNOSIS — M8448XD Pathological fracture, other site, subsequent encounter for fracture with routine healing: Secondary | ICD-10-CM | POA: Diagnosis not present

## 2021-08-01 DIAGNOSIS — M5459 Other low back pain: Secondary | ICD-10-CM | POA: Diagnosis not present

## 2021-08-01 DIAGNOSIS — M8448XD Pathological fracture, other site, subsequent encounter for fracture with routine healing: Secondary | ICD-10-CM | POA: Diagnosis not present

## 2021-08-04 DIAGNOSIS — M8448XD Pathological fracture, other site, subsequent encounter for fracture with routine healing: Secondary | ICD-10-CM | POA: Diagnosis not present

## 2021-08-04 DIAGNOSIS — M5459 Other low back pain: Secondary | ICD-10-CM | POA: Diagnosis not present

## 2021-08-08 DIAGNOSIS — M5459 Other low back pain: Secondary | ICD-10-CM | POA: Diagnosis not present

## 2021-08-08 DIAGNOSIS — M8448XD Pathological fracture, other site, subsequent encounter for fracture with routine healing: Secondary | ICD-10-CM | POA: Diagnosis not present

## 2021-08-10 ENCOUNTER — Telehealth: Payer: Self-pay | Admitting: Interventional Cardiology

## 2021-08-10 NOTE — Telephone Encounter (Signed)
° ° °  Patient calling the office for samples of medication: ° ° °1.  What medication and dosage are you requesting samples for? °ELIQUIS 5 MG TABS tablet ° °2.  Are you currently out of this medication? yes ° ° °

## 2021-08-10 NOTE — Telephone Encounter (Signed)
Spoke with pt and she states that she is unable to afford her Eliquis through the end of the year.  Last 90 day supply she picked up was over $350.  Inquired if pt had tried for pt assistance.  She states she did last year and they only gave her one 90 day supply.  Advised this would get her through the end of the year if she wanted to try again.  Pt almost out of medication.  PCP gave her the assistance forms but she hasn't filled them out yet.  Advised I will put a 2 week supply of samples at the front desk and to drop off assistance forms and any needed income/pharmacy information that is needed when she comes to pick up the samples.  Pt agreeable to plan.

## 2021-08-10 NOTE — Telephone Encounter (Signed)
Eliquis 5mg  PO Bid appropriate dose.  Recommend patient fill out BMS patient assistance form as well

## 2021-08-11 DIAGNOSIS — M5459 Other low back pain: Secondary | ICD-10-CM | POA: Diagnosis not present

## 2021-08-11 DIAGNOSIS — M8448XD Pathological fracture, other site, subsequent encounter for fracture with routine healing: Secondary | ICD-10-CM | POA: Diagnosis not present

## 2021-08-14 ENCOUNTER — Encounter: Payer: Self-pay | Admitting: Family Medicine

## 2021-08-17 ENCOUNTER — Telehealth (INDEPENDENT_AMBULATORY_CARE_PROVIDER_SITE_OTHER): Payer: PPO | Admitting: Family Medicine

## 2021-08-17 ENCOUNTER — Telehealth: Payer: Self-pay

## 2021-08-17 DIAGNOSIS — M8448XD Pathological fracture, other site, subsequent encounter for fracture with routine healing: Secondary | ICD-10-CM

## 2021-08-17 DIAGNOSIS — M8000XD Age-related osteoporosis with current pathological fracture, unspecified site, subsequent encounter for fracture with routine healing: Secondary | ICD-10-CM

## 2021-08-17 DIAGNOSIS — M5459 Other low back pain: Secondary | ICD-10-CM | POA: Diagnosis not present

## 2021-08-17 MED ORDER — ALENDRONATE SODIUM 70 MG PO TABS: 70.0000 mg | ORAL_TABLET | ORAL | 11 refills | Status: DC

## 2021-08-17 NOTE — Telephone Encounter (Signed)
**Note De-Identified Dione Mccombie Obfuscation** The pt left her BMSPAF application for Eliquis at the office with documents. I have completed the provider page of her application with our DOD, Dr Aquilla Hacker info in Dr Darliss Ridgel absence. I have emailed all to our TTL nurse so she can obtain Dr Olin Pia signature, date it, and to fax all to BMSPAF at the fax number written on the cover letter included or to place in Medical Records to be faxed

## 2021-08-17 NOTE — Telephone Encounter (Signed)
Pt assistance forms printed off with signature and date provided by the DOD Dr. Caryl Comes.  Forms placed in nurse fax box in medical records to be sent to the contact information provided on the cover sheet.  Will make Prior Auth Nurse aware of completion.

## 2021-08-17 NOTE — Progress Notes (Signed)
Virtual Visit via Video Note  I connected with Wendy Munoz on 08/17/21 at  1:30 PM EDT by a video enabled telemedicine application and verified that I am speaking with the correct person using two identifiers.  Location: Patient: home Provider: office   I discussed the limitations of evaluation and management by telemedicine and the availability of in person appointments. The patient expressed understanding and agreed to proceed.  History of Present Illness:  Ms. Wendy Munoz is a 80 yo female who is following up for her bilateral insufficiency fracture of the sacrum and her osteoporosis.  Her pain has significantly improved and she continues with physical therapy.  She is able to stand without pain but only has some soreness in her hips.   Observations/Objective:   Assessment and Plan:  Osteoporosis: Osteopenia with recent fracture qualifies as osteoporosis.  Counseled that she can stop the calcitonin.  Vitamin D has been normal. -Can start alendronate  Bilateral sacral insufficiency fracture: Pain has improved significantly.  -Counseled on home exercise therapy and supportive care. -Can continue out physical therapy. -Counseled on weaning out of the brace.  Follow Up Instructions:    I discussed the assessment and treatment plan with the patient. The patient was provided an opportunity to ask questions and all were answered. The patient agreed with the plan and demonstrated an understanding of the instructions.   The patient was advised to call back or seek an in-person evaluation if the symptoms worsen or if the condition fails to improve as anticipated.    Clearance Coots, MD

## 2021-08-17 NOTE — Assessment & Plan Note (Signed)
Osteopenia with recent fracture qualifies as osteoporosis.  Counseled that she can stop the calcitonin.  Vitamin D has been normal. -Can start alendronate

## 2021-08-17 NOTE — Assessment & Plan Note (Signed)
Pain has improved significantly.  -Counseled on home exercise therapy and supportive care. -Can continue out physical therapy. -Counseled on weaning out of the brace.

## 2021-08-22 ENCOUNTER — Telehealth: Payer: Self-pay | Admitting: Family Medicine

## 2021-08-22 DIAGNOSIS — M8000XD Age-related osteoporosis with current pathological fracture, unspecified site, subsequent encounter for fracture with routine healing: Secondary | ICD-10-CM

## 2021-08-22 NOTE — Telephone Encounter (Signed)
Pt cld to report she is able to get up & walk now, she is very pleased & excited and wanted to thank Dr. Raeford Razor for all his help.  --pt says also that she has scheduled a Bone  Density test w/ [facility] but they still need the order for medical necessity.  Faxed order for Bone Density to - [phone]  --glh

## 2021-08-23 NOTE — Telephone Encounter (Addendum)
Pt informed of below.  She states the DEXA  is scheduled for 12/07/21. She needs the order sent to [facility] for then. Updated order entered and faxed to [facility].

## 2021-08-24 DIAGNOSIS — M5459 Other low back pain: Secondary | ICD-10-CM | POA: Diagnosis not present

## 2021-08-24 DIAGNOSIS — M8448XD Pathological fracture, other site, subsequent encounter for fracture with routine healing: Secondary | ICD-10-CM | POA: Diagnosis not present

## 2021-08-25 NOTE — Telephone Encounter (Signed)
**Note De-Identified Mahmoud Blazejewski Obfuscation** Letter received from [facility] stating that they have approved the pt for asst with Eliquis until 01/58/6825 Application Case #: [id number]  The letter states that they have notified the pt of this approval as well.

## 2021-08-30 ENCOUNTER — Ambulatory Visit (INDEPENDENT_AMBULATORY_CARE_PROVIDER_SITE_OTHER): Payer: PPO

## 2021-08-30 VITALS — Ht 62.0 in | Wt 211.0 lb

## 2021-08-30 DIAGNOSIS — Z0000 Encounter for general adult medical examination without abnormal findings: Secondary | ICD-10-CM

## 2021-08-30 NOTE — Patient Instructions (Signed)
Wendy Munoz , Thank you for taking time to complete your Medicare Wellness Visit. I appreciate your ongoing commitment to your health goals. Please review the following plan we discussed and let me know if I can assist you in the future.   Screening recommendations/referrals: Colonoscopy: No longer required Mammogram: Completed 12/01/2020-Due 12/01/2021 Bone Density: Scheduled for 11/2021 Recommended yearly ophthalmology/optometry visit for glaucoma screening and checkup Recommended yearly dental visit for hygiene and checkup  Vaccinations: Influenza vaccine: Up to date Pneumococcal vaccine: Up to date Tdap vaccine: Discuss with pharmacy Shingles vaccine: Discuss with pharmacy   Covid-19:Declined  Advanced directives: Declined information today  Conditions/risks identified: See problem list  Next appointment: Follow up in one year for your annual wellness visit 09/01/2022 @ 1:40.   Preventive Care 80 Years and Older, Female Preventive care refers to lifestyle choices and visits with your health care provider that can promote health and wellness. What does preventive care include? A yearly physical exam. This is also called an annual well check. Dental exams once or twice a year. Routine eye exams. Ask your health care provider how often you should have your eyes checked. Personal lifestyle choices, including: Daily care of your teeth and gums. Regular physical activity. Eating a healthy diet. Avoiding tobacco and drug use. Limiting alcohol use. Practicing safe sex. Taking low-dose aspirin every day. Taking vitamin and mineral supplements as recommended by your health care provider. What happens during an annual well check? The services and screenings done by your health care provider during your annual well check will depend on your age, overall health, lifestyle risk factors, and family history of disease. Counseling  Your health care provider may ask you questions about  your: Alcohol use. Tobacco use. Drug use. Emotional well-being. Home and relationship well-being. Sexual activity. Eating habits. History of falls. Memory and ability to understand (cognition). Work and work Statistician. Reproductive health. Screening  You may have the following tests or measurements: Height, weight, and BMI. Blood pressure. Lipid and cholesterol levels. These may be checked every 5 years, or more frequently if you are over 80 years old. Skin check. Lung cancer screening. You may have this screening every year starting at age 80 if you have a 30-pack-year history of smoking and currently smoke or have quit within the past 15 years. Fecal occult blood test (FOBT) of the stool. You may have this test every year starting at age 80. Flexible sigmoidoscopy or colonoscopy. You may have a sigmoidoscopy every 5 years or a colonoscopy every 10 years starting at age 80. Hepatitis C blood test. Hepatitis B blood test. Sexually transmitted disease (STD) testing. Diabetes screening. This is done by checking your blood sugar (glucose) after you have not eaten for a while (fasting). You may have this done every 1-3 years. Bone density scan. This is done to screen for osteoporosis. You may have this done starting at age 80. Mammogram. This may be done every 1-2 years. Talk to your health care provider about how often you should have regular mammograms. Talk with your health care provider about your test results, treatment options, and if necessary, the need for more tests. Vaccines  Your health care provider may recommend certain vaccines, such as: Influenza vaccine. This is recommended every year. Tetanus, diphtheria, and acellular pertussis (Tdap, Td) vaccine. You may need a Td booster every 10 years. Zoster vaccine. You may need this after age 80. Pneumococcal 13-valent conjugate (PCV13) vaccine. One dose is recommended after age 80. Pneumococcal polysaccharide (PPSV23) vaccine.  One dose is recommended after age 80. Talk to your health care provider about which screenings and vaccines you need and how often you need them. This information is not intended to replace advice given to you by your health care provider. Make sure you discuss any questions you have with your health care provider. Document Released: 11/05/2015 Document Revised: 06/28/2016 Document Reviewed: 08/10/2015 Elsevier Interactive Patient Education  2017 [employer] Prevention in the Home Falls can cause injuries. They can happen to people of all ages. There are many things you can do to make your home safe and to help prevent falls. What can I do on the outside of my home? Regularly fix the edges of walkways and driveways and fix any cracks. Remove anything that might make you trip as you walk through a door, such as a raised step or threshold. Trim any bushes or trees on the path to your home. Use bright outdoor lighting. Clear any walking paths of anything that might make someone trip, such as rocks or tools. Regularly check to see if handrails are loose or broken. Make sure that both sides of any steps have handrails. Any raised decks and porches should have guardrails on the edges. Have any leaves, snow, or ice cleared regularly. Use sand or salt on walking paths during winter. Clean up any spills in your garage right away. This includes oil or grease spills. What can I do in the bathroom? Use night lights. Install grab bars by the toilet and in the tub and shower. Do not use towel bars as grab bars. Use non-skid mats or decals in the tub or shower. If you need to sit down in the shower, use a plastic, non-slip stool. Keep the floor dry. Clean up any water that spills on the floor as soon as it happens. Remove soap buildup in the tub or shower regularly. Attach bath mats securely with double-sided non-slip rug tape. Do not have throw rugs and other things on the floor that can make  you trip. What can I do in the bedroom? Use night lights. Make sure that you have a light by your bed that is easy to reach. Do not use any sheets or blankets that are too big for your bed. They should not hang down onto the floor. Have a firm chair that has side arms. You can use this for support while you get dressed. Do not have throw rugs and other things on the floor that can make you trip. What can I do in the kitchen? Clean up any spills right away. Avoid walking on wet floors. Keep items that you use a lot in easy-to-reach places. If you need to reach something above you, use a strong step stool that has a grab bar. Keep electrical cords out of the way. Do not use floor polish or wax that makes floors slippery. If you must use wax, use non-skid floor wax. Do not have throw rugs and other things on the floor that can make you trip. What can I do with my stairs? Do not leave any items on the stairs. Make sure that there are handrails on both sides of the stairs and use them. Fix handrails that are broken or loose. Make sure that handrails are as long as the stairways. Check any carpeting to make sure that it is firmly attached to the stairs. Fix any carpet that is loose or worn. Avoid having throw rugs at the top or bottom of the  stairs. If you do have throw rugs, attach them to the floor with carpet tape. Make sure that you have a light switch at the top of the stairs and the bottom of the stairs. If you do not have them, ask someone to add them for you. What else can I do to help prevent falls? Wear shoes that: Do not have high heels. Have rubber bottoms. Are comfortable and fit you well. Are closed at the toe. Do not wear sandals. If you use a stepladder: Make sure that it is fully opened. Do not climb a closed stepladder. Make sure that both sides of the stepladder are locked into place. Ask someone to hold it for you, if possible. Clearly mark and make sure that you can  see: Any grab bars or handrails. First and last steps. Where the edge of each step is. Use tools that help you move around (mobility aids) if they are needed. These include: Canes. Walkers. Scooters. Crutches. Turn on the lights when you go into a dark area. Replace any light bulbs as soon as they burn out. Set up your furniture so you have a clear path. Avoid moving your furniture around. If any of your floors are uneven, fix them. If there are any pets around you, be aware of where they are. Review your medicines with your doctor. Some medicines can make you feel dizzy. This can increase your chance of falling. Ask your doctor what other things that you can do to help prevent falls. This information is not intended to replace advice given to you by your health care provider. Make sure you discuss any questions you have with your health care provider. Document Released: 08/05/2009 Document Revised: 03/16/2016 Document Reviewed: 11/13/2014 Elsevier Interactive Patient Education  2017 [employer].

## 2021-08-30 NOTE — Progress Notes (Signed)
Subjective:   Wendy Munoz is a 80 y.o. female who presents for an Initial Medicare Annual Wellness Visit.  I connected with Wendy Munoz today by telephone and verified that I am speaking with the correct person using two identifiers. Location patient: home Location provider: work Persons participating in the virtual visit: patient, Marine scientist.    I discussed the limitations, risks, security and privacy concerns of performing an evaluation and management service by telephone and the availability of in person appointments. I also discussed with the patient that there may be a patient responsible charge related to this service. The patient expressed understanding and verbally consented to this telephonic visit.    Interactive audio and video telecommunications were attempted between this provider and patient, however failed, due to patient having technical difficulties OR patient did not have access to video capability.  We continued and completed visit with audio only.  Some vital signs may be absent or patient reported.   Time Spent with patient on telephone encounter: 30 minutes   Review of Systems     Cardiac Risk Factors include: advanced age (>6mn, >>86women);dyslipidemia;hypertension;obesity (BMI >30kg/m2)     Objective:    Today's Vitals   08/30/21 1340  Weight: 211 lb (95.7 kg)  Height: _0  (1.575 m)   Body mass index is 38.59 kg/m.  Advanced Directives 08/30/2021 07/04/2015 04/15/2015 02/03/2015 12/02/2014 01/26/2014  Does Patient Have a Medical Advance Directive? _1  Patient does not have advance directive  Would patient like information on creating a medical advance directive? No - Patient declined No - patient declined information No - patient declined information No - patient declined information No - patient declined information -    Current Medications (verified) Outpatient Encounter Medications as of 08/30/2021  Medication Sig   acyclovir ointment (ZOVIRAX) 5  % Apply small amount to affected 5 x daily x 4 days and as needed cold sore   alendronate (FOSAMAX) 70 MG tablet Take 1 tablet (70 mg total) by mouth every 7 (seven) days.   Calcium-Magnesium-Vitamin D (CALCIUM MAGNESIUM PO) Take 1 capsule by mouth daily.   cetirizine (ZYRTEC) 10 MG tablet Take 10 mg by mouth at bedtime.   Cholecalciferol (VITAMIN D) 2000 UNITS tablet Take 6,000 Units by mouth daily.   Coenzyme Q10 200 MG TABS Take 200 mg by mouth daily.   ELIQUIS 5 MG TABS tablet TAKE 1 TABLET BY MOUTH TWICE A DAY   levothyroxine (SYNTHROID) 88 MCG tablet Take 1 tablet (88 mcg total) by mouth daily before breakfast. Take 1 tablet by mouth daily except for Saturday (skip dose).   metoprolol tartrate (LOPRESSOR) 25 MG tablet TAKE 1 TABLET (25 MG TOTAL) BY MOUTH 2 (TWO) TIMES DAILY.   metroNIDAZOLE (METROGEL) 1 % gel Apply topically daily.   nitroGLYCERIN (NITROSTAT) 0.4 MG SL tablet Place 1 tablet (0.4 mg total) under the tongue every 5 (five) minutes as needed for chest pain.   nystatin cream (MYCOSTATIN) Apply 1 application topically 2 (two) times daily as needed for dry skin (rash on abdomen).   rosuvastatin (CRESTOR) 40 MG tablet TAKE 1/2 TABLET BY MOUTH DAILY   tiZANidine (ZANAFLEX) 2 MG tablet TAKE 1/2-1 TABLET (1-2 MG TOTAL) BY MOUTH AT BEDTIME AS NEEDED FOR MUSCLE SPASMS.   HYDROcodone-acetaminophen (NORCO/VICODIN) 5-325 MG tablet Take 1 tablet by mouth every 8 (eight) hours as needed. (Patient not taking: Reported on 08/30/2021)   No facility-administered encounter medications on file as of 08/30/2021.    Allergies (  verified) Keflex [cephalexin]   History: Past Medical History:  Diagnosis Date   Allergic state 06/26/2017   Anemia 05/21/2017   Arthritis    Atrial tachycardia ([place])    a. asymptomatic. Noted on tele during 11/2014 admission    CAD (coronary artery disease)    a. 11/2014 inf STEMI s/p DES to RCA; 50-70% ruptured plaque in pLAD- rx medically   GERD (gastroesophageal  reflux disease)    HTN (hypertension)    Hyperglycemia    Hyperlipidemia    Hypothyroidism    Insomnia 05/27/2017   Ischemic cardiomyopathy    a. 11/2014  LV gram with inferior hypokinesis. EF 45-50%.   Nocturia 01/08/2017   Obesity 06/26/2017   Osteoporosis    PAF (paroxysmal atrial fibrillation) ([place])    a. isolated episode of atrial fibrillation several years ago associated w/ her thyroid dysfunction (prior to ablation).     Prediabetes    Preventative health care 06/26/2017   Vasomotor rhinitis 06/26/2017   Past Surgical History:  Procedure Laterality Date   BREAST SURGERY     breast biospy   CATARACT EXTRACTION Bilateral    COLONOSCOPY     EYE SURGERY Bilateral 2005, 2006   Cataract with lens ImplaNT    INGUINAL HERNIA REPAIR Right    LEFT HEART CATHETERIZATION WITH CORONARY ANGIOGRAM N/A 12/02/2014   Procedure: LEFT HEART CATHETERIZATION WITH CORONARY ANGIOGRAM;  Surgeon: Sinclair Grooms, MD;  Location: [facility] CATH LAB;  Service: Cardiovascular;  Laterality: N/A;   ORIF HUMERUS FRACTURE Right 01/27/2014   Procedure: RIGHT OPEN REDUCTION INTERNAL FIXATION (ORIF) PROXIMAL HUMERUS FRACTURE;  Surgeon: Rozanna Box, MD;  Location: [facility];  Service: Orthopedics;  Laterality: Right;   TOTAL KNEE ARTHROPLASTY Right 2006   TOTAL KNEE ARTHROPLASTY Left 1610   UMBILICAL HERNIA REPAIR     as a baby   Family History  Problem Relation Age of Onset   CAD Mother    Heart disease Mother    Heart failure Mother    Polycythemia Father    Multiple myeloma Father    Cancer Sister        breast cancer   Stroke Sister    Hyperlipidemia Sister    Hypertension Sister    Breast cancer Sister 46   Neuropathy Sister    Heart disease Brother        bradycardia   Hypertension Brother    Atrial fibrillation Daughter    Arthritis Maternal Aunt    Breast cancer Maternal Aunt    Lung cancer Paternal Grandmother    Prostate cancer Paternal Grandfather    Diabetes Paternal Grandfather    Breast cancer  Other    Social History   Socioeconomic History   Marital status: Married    Spouse name: Not on file   Number of children: Not on file   Years of education: Not on file   Highest education level: Not on file  Occupational History   Occupation: retired   Tobacco Use   Smoking status: Never   Smokeless tobacco: Never  Vaping Use   Vaping Use: Never used  Substance and Sexual Activity   Alcohol use: No    Alcohol/week: 0.0 standard drinks   Drug use: No   Sexual activity: Yes    Partners: Male  Other Topics Concern   Not on file  Social History Narrative   Not on file   Social Determinants of Health   Financial Resource Strain: Low Risk    Difficulty  of Paying Living Expenses: Not hard at all  Food Insecurity: No Food Insecurity   Worried About [place] in the Last Year: Never true   [place] in the Last Year: Never true  Transportation Needs: No Transportation Needs   Lack of Transportation (Medical): No   Lack of Transportation (Non-Medical): No  Physical Activity: Sufficiently Active   Days of Exercise per Week: 4 days   Minutes of Exercise per Session: 60 min  Stress: No Stress Concern Present   Feeling of Stress : Not at all  Social Connections: Socially Integrated   Frequency of Communication with Friends and Family: More than three times a week   Frequency of Social Gatherings with Friends and Family: More than three times a week   Attends Religious Services: More than 4 times per year   Active Member of [employer] or Organizations: Yes   Attends Music therapist: More than 4 times per year   Marital Status: Married    Tobacco Counseling Counseling given: Not Answered   Clinical Intake:  Pre-visit preparation completed: Yes  Pain : No/denies pain     BMI - recorded: 38.59 Nutritional Status: BMI > 30  Obese Nutritional Risks: None Diabetes: No  How often do you need to have someone help you when you read instructions,  pamphlets, or other written materials from your doctor or pharmacy?: 1 - Never  Diabetic?No  Interpreter Needed?: No  Information entered by :: Caroleen Hamman LPN   Activities of Daily Living In your present state of health, do you have any difficulty performing the following activities: 08/30/2021 06/14/2021  Hearing? N N  Vision? N N  Difficulty concentrating or making decisions? N N  Walking or climbing stairs? N N  Dressing or bathing? N N  Doing errands, shopping? N N  Preparing Food and eating ? N -  Using the Toilet? N -  In the past six months, have you accidently leaked urine? Y -  Comment occasionally -  Do you have problems with loss of bowel control? N -  Managing your Medications? N -  Managing your Finances? N -  Housekeeping or managing your Housekeeping? N -  Some recent data might be hidden    Patient Care Team: Mosie Lukes, MD as PCP - General (Family Medicine) Belva Crome, MD as PCP - Cardiology (Cardiology) Vickey Huger, MD as Consulting Physician (Orthopedic Surgery) Altamese [place], MD as Consulting Physician (Orthopedic Surgery)  Indicate any recent Medical Services you may have received from other than [facility] providers in the past year (date may be approximate).     Assessment:   This is a routine wellness examination for Paloma.  Hearing/Vision screen Hearing Screening - Comments:: C/o mild hearing loss Vision Screening - Comments:: Last eye exam-12/2020-Dr. Tanner  Dietary issues and exercise activities discussed: Current Exercise Habits: Structured exercise class, Type of exercise: strength training/weights;stretching, Time (Minutes): 60, Frequency (Times/Week): 4, Weekly Exercise (Minutes/Week): 240, Intensity: Mild, Exercise limited by: orthopedic condition(s)   Goals Addressed             This Visit's Progress    Patient Stated       Continue doing exercises learned at physical therapy       Depression Screen PHQ 2/9 Scores  08/30/2021 06/14/2021 06/03/2020 01/08/2017  PHQ - 2 Score 0 0 0 0    Fall Risk Fall Risk  08/30/2021 06/14/2021 11/29/2020 09/16/2019 01/08/2017  Falls in the past year?  0 0 0 0 No  Comment - - - Emmi Telephone Survey: data to providers prior to load -  Number falls in past yr: 0 0 0 - -  Injury with Fall? 0 0 0 - -  Risk for fall due to : - No Fall Risks - - -  Follow up Falls prevention discussed Falls evaluation completed - - -    FALL RISK PREVENTION PERTAINING TO THE HOME:  Any stairs in or around the home? No  Home free of loose throw rugs in walkways, pet beds, electrical cords, etc? Yes  Adequate lighting in your home to reduce risk of falls? Yes   ASSISTIVE DEVICES UTILIZED TO PREVENT FALLS:  Life alert? No  Use of a cane, walker or w/c? No  Grab bars in the bathroom? No  Shower chair or bench in shower? Yes  Elevated toilet seat or a handicapped toilet? Yes   TIMED UP AND GO:  Was the test performed? No . Phone visit   Cognitive Function:Normal cognitive status assessed by this Nurse Health Advisor. No abnormalities found.          Immunizations Immunization History  Administered Date(s) Administered   Fluad Quad(high Dose 65+) 07/19/2019, 08/03/2020   Influenza, High Dose Seasonal PF 06/26/2017, 07/18/2018, 08/14/2021   Influenza-Unspecified 08/14/2021   Pneumococcal Conjugate-13 05/23/2014   Pneumococcal Polysaccharide-23 05/20/2019   Td 10/23/2009    TDAP status: Due, Education has been provided regarding the importance of this vaccine. Advised may receive this vaccine at local pharmacy or Health Dept. Aware to provide a copy of the vaccination record if obtained from local pharmacy or Health Dept. Verbalized acceptance and understanding.  Flu Vaccine status: Up to date  Pneumococcal vaccine status: Up to date  Covid-19 vaccine status: Declined, Education has been provided regarding the importance of this vaccine but patient still declined. Advised may  receive this vaccine at local pharmacy or Health Dept.or vaccine clinic. Aware to provide a copy of the vaccination record if obtained from local pharmacy or Health Dept. Verbalized acceptance and understanding.  Qualifies for Shingles Vaccine? Yes   Zostavax completed No   Shingrix Completed?: No.    Education has been provided regarding the importance of this vaccine. Patient has been advised to call insurance company to determine out of pocket expense if they have not yet received this vaccine. Advised may also receive vaccine at local pharmacy or Health Dept. Verbalized acceptance and understanding.  Screening Tests Health Maintenance  Topic Date Due   COVID-19 Vaccine (1) Never done   Hepatitis C Screening  Never done   Zoster Vaccines- Shingrix (1 of 2) Never done   TETANUS/TDAP  10/24/2019   Pneumonia Vaccine 49+ Years old  Completed   INFLUENZA VACCINE  Completed   DEXA SCAN  Completed   HPV VACCINES  Aged Out    Health Maintenance  Health Maintenance Due  Topic Date Due   COVID-19 Vaccine (1) Never done   Hepatitis C Screening  Never done   Zoster Vaccines- Shingrix (1 of 2) Never done   TETANUS/TDAP  10/24/2019    Colorectal cancer screening: No longer required.   Mammogram status: Completed bilateral 12/01/2020. Repeat every year  Bone Density status: Completed 209/2022. Results reflect: Bone density results: OSTEOPENIA. Repeat every 2 years.  Lung Cancer Screening: (Low Dose CT Chest recommended if Age 79-80 years, 30 pack-year currently smoking OR have quit w/in 15years.) does not qualify.     Additional Screening:  Hepatitis C  Screening: does not qualify  Vision Screening: Recommended annual ophthalmology exams for early detection of glaucoma and other disorders of the eye. Is the patient up to date with their annual eye exam?  Yes  Who is the provider or what is the name of the office in which the patient attends annual eye exams? Dr. Satira Sark   Dental  Screening: Recommended annual dental exams for proper oral hygiene  Community Resource Referral / Chronic Care Management: CRR required this visit?  No   CCM required this visit?  No      Plan:     I have personally reviewed and noted the following in the patient's chart:   Medical and social history Use of alcohol, tobacco or illicit drugs  Current medications and supplements including opioid prescriptions. Patient is not currently taking opioid prescriptions. Functional ability and status Nutritional status Physical activity Advanced directives List of other physicians Hospitalizations, surgeries, and ER visits in previous 12 months Vitals Screenings to include cognitive, depression, and falls Referrals and appointments  In addition, I have reviewed and discussed with patient certain preventive protocols, quality metrics, and best practice recommendations. A written personalized care plan for preventive services as well as general preventive health recommendations were provided to patient.   Due to this being a telephonic visit, the after visit summary with patients personalized plan was offered to patient via mail or my-chart.  Patient would like to access on my-chart.   Marta Antu, LPN   [id number]  Nurse Health Advisor  Nurse Notes: None

## 2021-08-31 DIAGNOSIS — M5459 Other low back pain: Secondary | ICD-10-CM | POA: Diagnosis not present

## 2021-08-31 DIAGNOSIS — M8448XD Pathological fracture, other site, subsequent encounter for fracture with routine healing: Secondary | ICD-10-CM | POA: Diagnosis not present

## 2021-09-07 ENCOUNTER — Other Ambulatory Visit: Payer: Self-pay | Admitting: Family Medicine

## 2021-09-07 DIAGNOSIS — M5459 Other low back pain: Secondary | ICD-10-CM | POA: Diagnosis not present

## 2021-09-07 DIAGNOSIS — M8448XD Pathological fracture, other site, subsequent encounter for fracture with routine healing: Secondary | ICD-10-CM | POA: Diagnosis not present

## 2021-09-13 ENCOUNTER — Other Ambulatory Visit: Payer: Self-pay | Admitting: Family Medicine

## 2021-10-09 ENCOUNTER — Other Ambulatory Visit: Payer: Self-pay | Admitting: Interventional Cardiology

## 2021-10-09 DIAGNOSIS — I480 Paroxysmal atrial fibrillation: Secondary | ICD-10-CM

## 2021-10-09 DIAGNOSIS — I2510 Atherosclerotic heart disease of native coronary artery without angina pectoris: Secondary | ICD-10-CM

## 2021-10-09 DIAGNOSIS — I10 Essential (primary) hypertension: Secondary | ICD-10-CM

## 2021-10-10 NOTE — Telephone Encounter (Signed)
Eliquis 5 mg refill request received. Patient is 80 years old, weight-95.7 kg, Crea- 1.17 on 11/29/20, Diagnosis-PAF, and last seen by Dr. Tamala Julian on 01/06/21. Dose is appropriate based on dosing criteria. Will send in refill to requested pharmacy.

## 2021-11-01 ENCOUNTER — Ambulatory Visit (INDEPENDENT_AMBULATORY_CARE_PROVIDER_SITE_OTHER): Payer: PPO | Admitting: Family Medicine

## 2021-11-01 ENCOUNTER — Encounter: Payer: Self-pay | Admitting: Family Medicine

## 2021-11-01 VITALS — BP 120/66 | HR 70 | Temp 97.7°F | Resp 16 | Ht 62.0 in | Wt 216.8 lb

## 2021-11-01 DIAGNOSIS — R350 Frequency of micturition: Secondary | ICD-10-CM | POA: Diagnosis not present

## 2021-11-01 DIAGNOSIS — I10 Essential (primary) hypertension: Secondary | ICD-10-CM

## 2021-11-01 DIAGNOSIS — R9389 Abnormal findings on diagnostic imaging of other specified body structures: Secondary | ICD-10-CM | POA: Insufficient documentation

## 2021-11-01 DIAGNOSIS — E785 Hyperlipidemia, unspecified: Secondary | ICD-10-CM

## 2021-11-01 DIAGNOSIS — M810 Age-related osteoporosis without current pathological fracture: Secondary | ICD-10-CM | POA: Diagnosis not present

## 2021-11-01 DIAGNOSIS — N888 Other specified noninflammatory disorders of cervix uteri: Secondary | ICD-10-CM

## 2021-11-01 DIAGNOSIS — R739 Hyperglycemia, unspecified: Secondary | ICD-10-CM

## 2021-11-01 DIAGNOSIS — E039 Hypothyroidism, unspecified: Secondary | ICD-10-CM | POA: Diagnosis not present

## 2021-11-01 DIAGNOSIS — E669 Obesity, unspecified: Secondary | ICD-10-CM

## 2021-11-01 DIAGNOSIS — D539 Nutritional anemia, unspecified: Secondary | ICD-10-CM | POA: Diagnosis not present

## 2021-11-01 HISTORY — DX: Abnormal findings on diagnostic imaging of other specified body structures: R93.89

## 2021-11-01 NOTE — Assessment & Plan Note (Signed)
On Levothyroxine, continue to monitor 

## 2021-11-01 NOTE — Assessment & Plan Note (Signed)
Worse at night, check UA and culture

## 2021-11-01 NOTE — Patient Instructions (Addendum)
Wegovy or Wendy Munoz are injectables for weight loss Tdap and Shingrix is the new shingles shot, 2 shots over 2-6 months, confirm coverage with insurance and document, then can return here for shots with nurse appt or at pharmacy   Osteoporosis Osteoporosis happens when the bones become thin and less dense than normal. Osteoporosis makes bones more brittle and fragile and more likely to break (fracture). Over time, osteoporosis can cause your bones to become so weak that they fracture after a minor fall. Bones in the hip, wrist, and spine are most likely to fracture due to osteoporosis. What are the causes? The exact cause of this condition is not known. What increases the risk? You are more likely to develop this condition if you: Have family members with this condition. Have poor nutrition. Use the following: Steroid medicines, such as prednisone. Anti-seizure medicines. Nicotine or tobacco, such as cigarettes, e-cigarettes, and chewing tobacco. Are female. Are age 83 or older. Are not physically active (are sedentary). Are of European or Asian descent. Have a small body frame. What are the signs or symptoms? A fracture might be the first sign of osteoporosis, especially if the fracture results from a fall or injury that usually would not cause a bone to break. Other signs and symptoms include: Pain in the neck or low back. Stooped posture. Loss of height. How is this diagnosed? This condition may be diagnosed based on: Your medical history. A physical exam. A bone mineral density test, also called a DXA or DEXA test (dual-energy X-ray absorptiometry test). This test uses X-rays to measure the amount of minerals in your bones. How is this treated? This condition may be treated by: Making lifestyle changes, such as: Including foods with more calcium and vitamin D in your diet. Doing weight-bearing and muscle-strengthening exercises. Stopping tobacco use. Limiting alcohol  intake. Taking medicine to slow the process of bone loss or to increase bone density. Taking daily supplements of calcium and vitamin D. Taking hormone replacement medicines, such as estrogen for women and testosterone for men. Monitoring your levels of calcium and vitamin D. The goal of treatment is to strengthen your bones and lower your risk for a fracture. Follow these instructions at home: Eating and drinking Include calcium and vitamin D in your diet. Calcium is important for bone health, and vitamin D helps your body absorb calcium. Good sources of calcium and vitamin D include: Certain fatty fish, such as salmon and tuna. Products that have calcium and vitamin D added to them (are fortified), such as fortified cereals. Egg yolks. Cheese. Liver.  Activity Do exercises as told by your health care provider. Ask your health care provider what exercises and activities are safe for you. You should do: Exercises that make you work against gravity (weight-bearing exercises), such as tai chi, yoga, or walking. Exercises to strengthen muscles, such as lifting weights. Lifestyle Do not drink alcohol if: Your health care provider tells you not to drink. You are pregnant, may be pregnant, or are planning to become pregnant. If you drink alcohol: Limit how much you use to: 0-1 drink a day for women. 0-2 drinks a day for men. Know how much alcohol is in your drink. In the [place] one drink equals one 12 oz bottle of beer (355 mL), one 5 oz glass of wine (148 mL), or one 1 oz glass of hard liquor (44 mL). Do not use any products that contain nicotine or tobacco, such as cigarettes, e-cigarettes, and chewing tobacco. If you need  help quitting, ask your health care provider. Preventing falls Use devices to help you move around (mobility aids) as needed, such as canes, walkers, scooters, or crutches. Keep rooms well-lit and clutter-free. Remove tripping hazards from walkways, including cords and  throw rugs. Install grab bars in bathrooms and safety rails on stairs. Use rubber mats in the bathroom and other areas that are often wet or slippery. Wear closed-toe shoes that fit well and support your feet. Wear shoes that have rubber soles or low heels. Review your medicines with your health care provider. Some medicines can cause dizziness or changes in blood pressure, which can increase your risk of falling. General instructions Take over-the-counter and prescription medicines only as told by your health care provider. Keep all follow-up visits. This is important. Contact a health care provider if: You have never been screened for osteoporosis and you are: A woman who is age 71 or older. A man who is age 53 or older. Get help right away if: You fall or injure yourself. Summary Osteoporosis is thinning and loss of density in your bones. This makes bones more brittle and fragile and more likely to break (fracture),even with minor falls. The goal of treatment is to strengthen your bones and lower your risk for a fracture. Include calcium and vitamin D in your diet. Calcium is important for bone health, and vitamin D helps your body absorb calcium. Talk with your health care provider about screening for osteoporosis if you are a woman who is age 5 or older, or a man who is age 40 or older. This information is not intended to replace advice given to you by your health care provider. Make sure you discuss any questions you have with your health care provider. Document Revised: 03/25/2020 Document Reviewed: 03/25/2020 Elsevier Patient Education  [place].

## 2021-11-01 NOTE — Assessment & Plan Note (Signed)
And a nabothian cyst on MR of Pelvis in September 2022. Repeat US to revaluate

## 2021-11-01 NOTE — Progress Notes (Signed)
Subjective:    Patient ID: Wendy Munoz, female    DOB: [birth year], 81 y.o.   MRN: [id number]  No chief complaint on file.   HPI Patient is in today for follow up on chronic medical concerns. No recent febrile illness or hospitalizations. She is mostly concerned about her persistent right hip discomfort. She notes it is not really painful unless she tries to sleep on that side. No radicular symptoms. She had sacral insufficiency fractures and is now on Fosamax and has a repeat Dexa scan ordered soon. She is trying to stay active and take vitamin D and calcium supplements. Denies CP/palp/SOB/HA/congestion/fevers/GI or GU c/o. Taking meds as prescribed   Past Medical History:  Diagnosis Date   Allergic state 06/26/2017   Anemia 05/21/2017   Arthritis    Atrial tachycardia ([place])    a. asymptomatic. Noted on tele during 11/2014 admission    CAD (coronary artery disease)    a. 11/2014 inf STEMI s/p DES to RCA; 50-70% ruptured plaque in pLAD- rx medically   GERD (gastroesophageal reflux disease)    HTN (hypertension)    Hyperglycemia    Hyperlipidemia    Hypothyroidism    Insomnia 05/27/2017   Ischemic cardiomyopathy    a. 11/2014  LV gram with inferior hypokinesis. EF 45-50%.   Nocturia 01/08/2017   Obesity 06/26/2017   Osteoporosis    PAF (paroxysmal atrial fibrillation) ([place])    a. isolated episode of atrial fibrillation several years ago associated w/ her thyroid dysfunction (prior to ablation).     Prediabetes    Preventative health care 06/26/2017   Vasomotor rhinitis 06/26/2017    Past Surgical History:  Procedure Laterality Date   BREAST SURGERY     breast biospy   CATARACT EXTRACTION Bilateral    COLONOSCOPY     EYE SURGERY Bilateral 2005, 2006   Cataract with lens ImplaNT    INGUINAL HERNIA REPAIR Right    LEFT HEART CATHETERIZATION WITH CORONARY ANGIOGRAM N/A 12/02/2014   Procedure: LEFT HEART CATHETERIZATION WITH CORONARY ANGIOGRAM;  Surgeon: Sinclair Grooms, MD;   Location: [facility] CATH LAB;  Service: Cardiovascular;  Laterality: N/A;   ORIF HUMERUS FRACTURE Right 01/27/2014   Procedure: RIGHT OPEN REDUCTION INTERNAL FIXATION (ORIF) PROXIMAL HUMERUS FRACTURE;  Surgeon: Rozanna Box, MD;  Location: [facility];  Service: Orthopedics;  Laterality: Right;   TOTAL KNEE ARTHROPLASTY Right 2006   TOTAL KNEE ARTHROPLASTY Left [id number]   UMBILICAL HERNIA REPAIR     as a baby    Family History  Problem Relation Age of Onset   CAD Mother    Heart disease Mother    Heart failure Mother    Polycythemia Father    Multiple myeloma Father    Cancer Sister        breast cancer   Stroke Sister    Hyperlipidemia Sister    Hypertension Sister    Breast cancer Sister 29   Neuropathy Sister    Heart disease Brother        bradycardia   Hypertension Brother    Atrial fibrillation Daughter    Arthritis Maternal Aunt    Breast cancer Maternal Aunt    Lung cancer Paternal Grandmother    Prostate cancer Paternal Grandfather    Diabetes Paternal Grandfather    Breast cancer Other     Social History   Socioeconomic History   Marital status: Married    Spouse name: Not on file   Number of children: Not on  file   Years of education: Not on file   Highest education level: Not on file  Occupational History   Occupation: retired   Tobacco Use   Smoking status: Never   Smokeless tobacco: Never  Vaping Use   Vaping Use: Never used  Substance and Sexual Activity   Alcohol use: No    Alcohol/week: 0.0 standard drinks   Drug use: No   Sexual activity: Yes    Partners: Male  Other Topics Concern   Not on file  Social History Narrative   Not on file   Social Determinants of Health   Financial Resource Strain: Low Risk    Difficulty of Paying Living Expenses: Not hard at all  Food Insecurity: No Food Insecurity   Worried About Charity fundraiser in the Last Year: Never true   [place] in the Last Year: Never true  Transportation Needs: No Transportation  Needs   Lack of Transportation (Medical): No   Lack of Transportation (Non-Medical): No  Physical Activity: Sufficiently Active   Days of Exercise per Week: 4 days   Minutes of Exercise per Session: 60 min  Stress: No Stress Concern Present   Feeling of Stress : Not at all  Social Connections: Socially Integrated   Frequency of Communication with Friends and Family: More than three times a week   Frequency of Social Gatherings with Friends and Family: More than three times a week   Attends Religious Services: More than 4 times per year   Active Member of [employer] or Organizations: Yes   Attends Music therapist: More than 4 times per year   Marital Status: Married  Human resources officer Violence: Not At Risk   Fear of Current or Ex-Partner: No   Emotionally Abused: No   Physically Abused: No   Sexually Abused: No    Outpatient Medications Prior to Visit  Medication Sig Dispense Refill   acyclovir ointment (ZOVIRAX) 5 % Apply small amount to affected 5 x daily x 4 days and as needed cold sore 30 g 1   alendronate (FOSAMAX) 70 MG tablet Take 1 tablet (70 mg total) by mouth every 7 (seven) days. 4 tablet 11   Calcium-Magnesium-Vitamin D (CALCIUM MAGNESIUM PO) Take 1 capsule by mouth daily.     cetirizine (ZYRTEC) 10 MG tablet Take 10 mg by mouth at bedtime.     Cholecalciferol (VITAMIN D) 2000 UNITS tablet Take 6,000 Units by mouth daily.     Coenzyme Q10 200 MG TABS Take 200 mg by mouth daily.     ELIQUIS 5 MG TABS tablet TAKE 1 TABLET BY MOUTH TWICE A DAY 180 tablet 1   levothyroxine (SYNTHROID) 88 MCG tablet TAKE 1 TABLET (88 MCG TOTAL) BY MOUTH DAILY BEFORE BREAKFAST. TAKE 1 TABLET BY MOUTH DAILY EXCEPT FOR SATURDAY (SKIP DOSE). 90 tablet 1   metoprolol tartrate (LOPRESSOR) 25 MG tablet TAKE 1 TABLET (25 MG TOTAL) BY MOUTH 2 (TWO) TIMES DAILY. 180 tablet 3   metroNIDAZOLE (METROGEL) 1 % gel Apply topically daily. 45 g 2   nitroGLYCERIN (NITROSTAT) 0.4 MG SL tablet Place 1  tablet (0.4 mg total) under the tongue every 5 (five) minutes as needed for chest pain. 25 tablet 6   nystatin cream (MYCOSTATIN) Apply 1 application topically 2 (two) times daily as needed for dry skin (rash on abdomen). 30 g 2   rosuvastatin (CRESTOR) 40 MG tablet TAKE 1/2 TABLET BY MOUTH EVERY DAY 45 tablet 1   HYDROcodone-acetaminophen (  NORCO/VICODIN) 5-325 MG tablet Take 1 tablet by mouth every 8 (eight) hours as needed. (Patient not taking: Reported on 08/30/2021) 15 tablet 0   tiZANidine (ZANAFLEX) 2 MG tablet TAKE 1/2-1 TABLET (1-2 MG TOTAL) BY MOUTH AT BEDTIME AS NEEDED FOR MUSCLE SPASMS. 30 tablet 1   No facility-administered medications prior to visit.    Allergies  Allergen Reactions   Keflex [Cephalexin] Rash    Review of Systems  Constitutional:  Negative for fever and malaise/fatigue.  HENT:  Negative for congestion.   Eyes:  Negative for blurred vision.  Respiratory:  Negative for shortness of breath.   Cardiovascular:  Negative for chest pain, palpitations and leg swelling.  Gastrointestinal:  Negative for abdominal pain, blood in stool and nausea.  Genitourinary:  Negative for dysuria and frequency.  Musculoskeletal:  Positive for joint pain. Negative for falls.  Skin:  Negative for rash.  Neurological:  Negative for dizziness, loss of consciousness and headaches.  Endo/Heme/Allergies:  Negative for environmental allergies.  Psychiatric/Behavioral:  Negative for depression. The patient is not nervous/anxious.       Objective:    Physical Exam Constitutional:      General: She is not in acute distress.    Appearance: She is well-developed.  HENT:     Head: Normocephalic and atraumatic.  Eyes:     Conjunctiva/sclera: Conjunctivae normal.  Neck:     Thyroid: No thyromegaly.  Cardiovascular:     Rate and Rhythm: Normal rate and regular rhythm.     Heart sounds: Normal heart sounds. No murmur heard. Pulmonary:     Effort: Pulmonary effort is normal. No  respiratory distress.     Breath sounds: Normal breath sounds.  Abdominal:     General: Bowel sounds are normal. There is no distension.     Palpations: Abdomen is soft. There is no mass.     Tenderness: There is no abdominal tenderness.  Musculoskeletal:     Cervical back: Neck supple.  Lymphadenopathy:     Cervical: No cervical adenopathy.  Skin:    General: Skin is warm and dry.  Neurological:     Mental Status: She is alert and oriented to person, place, and time.  Psychiatric:        Behavior: Behavior normal.    BP 120/66    Pulse 70    Temp 97.7 F (36.5 C)    Resp 16    Ht 5' 2"  (1.575 m)    Wt 216 lb 12.8 oz (98.3 kg)    SpO2 95%    BMI 39.65 kg/m  Wt Readings from Last 3 Encounters:  11/01/21 216 lb 12.8 oz (98.3 kg)  08/30/21 211 lb (95.7 kg)  07/20/21 211 lb (95.7 kg)    Diabetic Foot Exam - Simple   No data filed    Lab Results  Component Value Date   WBC 5.9 06/14/2021   HGB 12.1 06/14/2021   HCT 37.0 06/14/2021   PLT 181.0 06/14/2021   GLUCOSE 102 (H) 11/29/2020   CHOL 174 06/14/2021   TRIG 110.0 06/14/2021   HDL 79.40 06/14/2021   LDLCALC 73 06/14/2021   ALT 15 11/29/2020   AST 23 11/29/2020   NA 138 11/29/2020   K 4.1 11/29/2020   CL 104 11/29/2020   CREATININE 1.17 11/29/2020   BUN 18 11/29/2020   CO2 26 11/29/2020   TSH 1.29 06/14/2021   INR 1.05 01/27/2014   HGBA1C 6.4 06/14/2021    Lab Results  Component Value Date  TSH 1.29 06/14/2021   Lab Results  Component Value Date   WBC 5.9 06/14/2021   HGB 12.1 06/14/2021   HCT 37.0 06/14/2021   MCV 96.4 06/14/2021   PLT 181.0 06/14/2021   Lab Results  Component Value Date   NA 138 11/29/2020   K 4.1 11/29/2020   CO2 26 11/29/2020   GLUCOSE 102 (H) 11/29/2020   BUN 18 11/29/2020   CREATININE 1.17 11/29/2020   BILITOT 0.8 11/29/2020   ALKPHOS 69 11/29/2020   AST 23 11/29/2020   ALT 15 11/29/2020   PROT 7.2 11/29/2020   ALBUMIN 4.2 11/29/2020   CALCIUM 9.6 11/29/2020    ANIONGAP 7 04/15/2015   GFR 44.50 (L) 11/29/2020   Lab Results  Component Value Date   CHOL 174 06/14/2021   Lab Results  Component Value Date   HDL 79.40 06/14/2021   Lab Results  Component Value Date   LDLCALC 73 06/14/2021   Lab Results  Component Value Date   TRIG 110.0 06/14/2021   Lab Results  Component Value Date   CHOLHDL 2 06/14/2021   Lab Results  Component Value Date   HGBA1C 6.4 06/14/2021       Assessment & Plan:   Problem List Items Addressed This Visit     Hypothyroidism - Primary    On Levothyroxine, continue to monitor      Relevant Orders   Vitamin D 1,25 dihydroxy   TSH   Hyperglycemia    hgba1c acceptable, minimize simple carbs. Increase exercise as tolerated.       Relevant Orders   Hemoglobin A1c   Hemoglobin A1c   HTN (hypertension)    Well controlled, no changes to meds. Encouraged heart healthy diet such as the DASH diet and exercise as tolerated.       Relevant Orders   CBC with Differential/Platelet   Comprehensive metabolic panel   TSH   Lipid panel   CBC   Comprehensive metabolic panel   VITAMIN D 25 Hydroxy (Vit-D Deficiency, Fractures)   Osteoporosis    Tolerating Fosamax Encouraged to get adequate exercise, calcium and vitamin d intake      Relevant Orders   Vitamin D 1,25 dihydroxy   Urinary frequency    Worse at night, check UA and culture      Relevant Orders   Urinalysis   Urine Culture   Hyperlipidemia    Tolerating statin, encouraged heart healthy diet, avoid trans fats, minimize simple carbs and saturated fats. Increase exercise as tolerated      Relevant Orders   CBC with Differential/Platelet   Comprehensive metabolic panel   TSH   Lipid panel   Lipid panel   Obesity    Encouraged DASH or MIND diet, decrease po intake and increase exercise as tolerated. Needs 7-8 hours of sleep nightly. Avoid trans fats, eat small, frequent meals every 4-5 hours with lean proteins, complex carbs and healthy  fats. Minimize simple carbs, high fat foods and processed foods. Consider [id number] or Saxenda      Thickened endometrium    And a nabothian cyst on MR of Pelvis in September 2022. Repeat US to revaluate      Relevant Orders   US Pelvic Complete With Transvaginal   US Pelvic Complete With Transvaginal   Other Visit Diagnoses     Nabothian cyst       Relevant Orders   US Pelvic Complete With Transvaginal   US Pelvic Complete With Transvaginal  I have discontinued Sherlon Nied. Flessner's tiZANidine and HYDROcodone-acetaminophen. I am also having her maintain her cetirizine, Vitamin D, Calcium-Magnesium-Vitamin D (CALCIUM MAGNESIUM PO), Coenzyme Q10, acyclovir ointment, nitroGLYCERIN, nystatin cream, metoprolol tartrate, metroNIDAZOLE, alendronate, levothyroxine, rosuvastatin, and Eliquis.  No orders of the defined types were placed in this encounter.    Penni Homans, MD

## 2021-11-01 NOTE — Assessment & Plan Note (Signed)
Tolerating statin, encouraged heart healthy diet, avoid trans fats, minimize simple carbs and saturated fats. Increase exercise as tolerated 

## 2021-11-01 NOTE — Assessment & Plan Note (Addendum)
Encouraged DASH or MIND diet, decrease po intake and increase exercise as tolerated. Needs 7-8 hours of sleep nightly. Avoid trans fats, eat small, frequent meals every 4-5 hours with lean proteins, complex carbs and healthy fats. Minimize simple carbs, high fat foods and processed foods. Consider Wegovy or Saxenda °

## 2021-11-01 NOTE — Assessment & Plan Note (Signed)
Tolerating Fosamax Encouraged to get adequate exercise, calcium and vitamin d intake

## 2021-11-01 NOTE — Assessment & Plan Note (Signed)
Well controlled, no changes to meds. Encouraged heart healthy diet such as the DASH diet and exercise as tolerated.  °

## 2021-11-01 NOTE — Assessment & Plan Note (Signed)
hgba1c acceptable, minimize simple carbs. Increase exercise as tolerated.  

## 2021-11-02 ENCOUNTER — Other Ambulatory Visit: Payer: Self-pay

## 2021-11-02 DIAGNOSIS — D539 Nutritional anemia, unspecified: Secondary | ICD-10-CM

## 2021-11-02 LAB — COMPREHENSIVE METABOLIC PANEL
ALT: 10 U/L (ref 0–35)
AST: 20 U/L (ref 0–37)
Albumin: 4.1 g/dL (ref 3.5–5.2)
Alkaline Phosphatase: 73 U/L (ref 39–117)
BUN: 18 mg/dL (ref 6–23)
CO2: 28 mEq/L (ref 19–32)
Calcium: 9 mg/dL (ref 8.4–10.5)
Chloride: 103 mEq/L (ref 96–112)
Creatinine, Ser: 1.02 mg/dL (ref 0.40–1.20)
GFR: 52.12 mL/min — ABNORMAL LOW (ref >60.00)
Glucose, Bld: 108 mg/dL — ABNORMAL HIGH (ref 70–99)
Potassium: 4.1 mEq/L (ref 3.5–5.1)
Sodium: 139 mEq/L (ref 135–145)
Total Bilirubin: 0.6 mg/dL (ref 0.2–1.2)
Total Protein: 6.7 g/dL (ref 6.0–8.3)

## 2021-11-02 LAB — URINALYSIS, ROUTINE W REFLEX MICROSCOPIC
Bilirubin Urine: NEGATIVE
Ketones, ur: NEGATIVE
Nitrite: NEGATIVE
Specific Gravity, Urine: 1.015 (ref 1.000–1.030)
Total Protein, Urine: NEGATIVE
Urine Glucose: NEGATIVE
Urobilinogen, UA: 0.2 (ref 0.0–1.0)
pH: 6 (ref 5.0–8.0)

## 2021-11-02 LAB — CBC WITH DIFFERENTIAL/PLATELET
Basophils Absolute: 0.1 10*3/uL (ref 0.0–0.1)
Basophils Relative: 1.3 % (ref 0.0–3.0)
Eosinophils Absolute: 0.2 10*3/uL (ref 0.0–0.7)
Eosinophils Relative: 3.8 % (ref 0.0–5.0)
HCT: 35.2 % — ABNORMAL LOW (ref 36.0–46.0)
Hemoglobin: 11.7 g/dL — ABNORMAL LOW (ref 12.0–15.0)
Lymphocytes Relative: 24 % (ref 12.0–46.0)
Lymphs Abs: 1.1 10*3/uL (ref 0.7–4.0)
MCHC: 33.3 g/dL (ref 30.0–36.0)
MCV: 97.1 fl (ref 78.0–100.0)
Monocytes Absolute: 0.7 10*3/uL (ref 0.1–1.0)
Monocytes Relative: 14.6 % — ABNORMAL HIGH (ref 3.0–12.0)
Neutro Abs: 2.7 10*3/uL (ref 1.4–7.7)
Neutrophils Relative %: 56.3 % (ref 43.0–77.0)
Platelets: 183 10*3/uL (ref 150.0–400.0)
RBC: 3.63 Mil/uL — ABNORMAL LOW (ref 3.87–5.11)
RDW: 13.5 % (ref 11.5–15.5)
WBC: 4.8 10*3/uL (ref 4.0–10.5)

## 2021-11-02 LAB — LIPID PANEL
Cholesterol: 194 mg/dL (ref 0–200)
HDL: 83.3 mg/dL (ref >39.00)
LDL Cholesterol: 90 mg/dL (ref 0–99)
NonHDL: 110.29
Total CHOL/HDL Ratio: 2
Triglycerides: 100 mg/dL (ref 0.0–149.0)
VLDL: 20 mg/dL (ref 0.0–40.0)

## 2021-11-02 LAB — HEMOGLOBIN A1C: Hgb A1c MFr Bld: 6.2 % (ref 4.6–6.5)

## 2021-11-02 LAB — TSH: TSH: 3.3 u[IU]/mL (ref 0.35–5.50)

## 2021-11-03 ENCOUNTER — Other Ambulatory Visit: Payer: PPO

## 2021-11-03 DIAGNOSIS — D539 Nutritional anemia, unspecified: Secondary | ICD-10-CM | POA: Diagnosis not present

## 2021-11-03 NOTE — Addendum Note (Signed)
Addended by: Kelle Darting A on: 11/03/2021 08:38 AM   Modules accepted: Orders

## 2021-11-04 LAB — URINE CULTURE
MICRO NUMBER:: 12850939
SPECIMEN QUALITY:: ADEQUATE

## 2021-11-04 LAB — VITAMIN D 1,25 DIHYDROXY
Vitamin D 1, 25 (OH)2 Total: 39 pg/mL (ref 18–72)
Vitamin D2 1, 25 (OH)2: <8 pg/mL
Vitamin D3 1, 25 (OH)2: 39 pg/mL

## 2021-11-08 LAB — TEST AUTHORIZATION

## 2021-11-08 LAB — INTRINSIC FACTOR ANTIBODIES: Intrinsic Factor: NEGATIVE

## 2021-11-08 LAB — VITAMIN B12: Vitamin B-12: 271 pg/mL (ref 200–1100)

## 2021-11-11 ENCOUNTER — Telehealth: Payer: Self-pay

## 2021-11-11 ENCOUNTER — Ambulatory Visit (HOSPITAL_BASED_OUTPATIENT_CLINIC_OR_DEPARTMENT_OTHER)
Admission: RE | Admit: 2021-11-11 | Discharge: 2021-11-11 | Disposition: A | Discharge status: Home / Self Care | Payer: PPO | Source: Ambulatory Visit | Attending: Family Medicine | Admitting: Family Medicine

## 2021-11-11 ENCOUNTER — Other Ambulatory Visit: Payer: Self-pay | Admitting: Family Medicine

## 2021-11-11 ENCOUNTER — Other Ambulatory Visit: Payer: Self-pay

## 2021-11-11 ENCOUNTER — Other Ambulatory Visit (HOSPITAL_BASED_OUTPATIENT_CLINIC_OR_DEPARTMENT_OTHER): Payer: Self-pay

## 2021-11-11 DIAGNOSIS — R9389 Abnormal findings on diagnostic imaging of other specified body structures: Secondary | ICD-10-CM | POA: Insufficient documentation

## 2021-11-11 DIAGNOSIS — N888 Other specified noninflammatory disorders of cervix uteri: Secondary | ICD-10-CM

## 2021-11-11 DIAGNOSIS — N939 Abnormal uterine and vaginal bleeding, unspecified: Secondary | ICD-10-CM | POA: Diagnosis not present

## 2021-11-11 DIAGNOSIS — N854 Malposition of uterus: Secondary | ICD-10-CM | POA: Diagnosis not present

## 2021-11-11 DIAGNOSIS — Z780 Asymptomatic menopausal state: Secondary | ICD-10-CM | POA: Diagnosis not present

## 2021-11-11 MED ORDER — BOOSTRIX 5-2.5-18.5 LF-MCG/0.5 IM SUSY
PREFILLED_SYRINGE | INTRAMUSCULAR | 0 refills | Status: DC
  Filled 2021-11-11: qty 0.5, 1d supply, fill #0

## 2021-11-11 NOTE — Telephone Encounter (Signed)
Sonographer called from downstairs stated that pt didn't feel comfortable doing a transvaginal u/s but they could see everything they needed to with just with abdominal complete u/s.

## 2021-12-07 DIAGNOSIS — M85851 Other specified disorders of bone density and structure, right thigh: Secondary | ICD-10-CM | POA: Diagnosis not present

## 2021-12-07 DIAGNOSIS — Z1231 Encounter for screening mammogram for malignant neoplasm of breast: Secondary | ICD-10-CM | POA: Diagnosis not present

## 2021-12-07 DIAGNOSIS — M85852 Other specified disorders of bone density and structure, left thigh: Secondary | ICD-10-CM | POA: Diagnosis not present

## 2021-12-07 LAB — HM DEXA SCAN

## 2021-12-07 LAB — HM MAMMOGRAPHY

## 2021-12-26 DIAGNOSIS — H5213 Myopia, bilateral: Secondary | ICD-10-CM | POA: Diagnosis not present

## 2021-12-26 DIAGNOSIS — H18513 Endothelial corneal dystrophy, bilateral: Secondary | ICD-10-CM | POA: Diagnosis not present

## 2021-12-26 DIAGNOSIS — H0100A Unspecified blepharitis right eye, upper and lower eyelids: Secondary | ICD-10-CM | POA: Diagnosis not present

## 2021-12-26 DIAGNOSIS — H26491 Other secondary cataract, right eye: Secondary | ICD-10-CM | POA: Diagnosis not present

## 2022-01-18 ENCOUNTER — Ambulatory Visit: Payer: PPO | Admitting: Family Medicine

## 2022-01-18 ENCOUNTER — Encounter: Payer: Self-pay | Admitting: Family Medicine

## 2022-01-18 ENCOUNTER — Telehealth: Payer: Self-pay | Admitting: Interventional Cardiology

## 2022-01-18 DIAGNOSIS — M8000XD Age-related osteoporosis with current pathological fracture, unspecified site, subsequent encounter for fracture with routine healing: Secondary | ICD-10-CM | POA: Diagnosis not present

## 2022-01-18 DIAGNOSIS — I480 Paroxysmal atrial fibrillation: Secondary | ICD-10-CM

## 2022-01-18 DIAGNOSIS — I2510 Atherosclerotic heart disease of native coronary artery without angina pectoris: Secondary | ICD-10-CM

## 2022-01-18 DIAGNOSIS — I10 Essential (primary) hypertension: Secondary | ICD-10-CM

## 2022-01-18 MED ORDER — APIXABAN 5 MG PO TABS: 5.0000 mg | ORAL_TABLET | Freq: Two times a day (BID) | ORAL | 1 refills | Status: DC

## 2022-01-18 NOTE — Telephone Encounter (Signed)
Called and spoke to pt and made her aware that legally, if I send in for a 30 day supply of Eliquis, I will have to send in for 60 tabs. If I send in for 90 days I can send in 180 tabs. Offered pt to see if she could qualify for pt assistance. Pt stated she does not need patient assistance she was just wanting to prevent for going into the doughnut hole.  ? ?Pt stated that if I cant not legally send in 90 tabs for 30 day then to send in a 90 day supply.  ? ?Will send in for 90 days.  ? ?Scr: 1.02, 11/01/2021 ?Weight: 98 kg  ?Age: 81 yo  ? ? ?Pt scheduled to see Dr. Tamala Julian 02/20/2022 ? ?Refill sent.  ?

## 2022-01-18 NOTE — Telephone Encounter (Signed)
Originally sent in for 90 days with 1 refill. Pt needs to see cardiologist 02/20/2022. Called pharmacy and told them just to refill for a 90 day supply, no refill.  ?

## 2022-01-18 NOTE — Telephone Encounter (Signed)
? ? ? ?*  STAT* If patient is at the pharmacy, call can be transferred to refill team. ? ? ?1. Which medications need to be refilled? (please list name of each medication and dose if known) ELIQUIS 5 MG TABS tablet ? ?2. Which pharmacy/location (including street and city if local pharmacy) is medication to be sent to? ?[id number]- [place], [place] - [address] ? ?3. Do they need a 30 day or 90 day supply? 30 day supply ? ?Patient wants Dr. STamala Julianto write the rx for 90 pills to be dispensed at a time. She is hoping that picking up a 30 day supply with 90 pills will keep her from going into the donut hole at the end of each year. ?

## 2022-01-18 NOTE — Telephone Encounter (Addendum)
A 30 day supply of Eliquis would be 60 tablets and 90 tablets would be a 45 day supply. We do not extend the truth as this is not legal to do so. If she is having issues affording the medication we can ask her Cardiologist about switching to an alternative.  ? ?Will call the pt and discuss this matter. There was no answer so left a message for the pt to call back regarding this matter.  ?

## 2022-01-18 NOTE — Telephone Encounter (Incomplete Revision)
A 30 day supply of Eliquis would be 60 tablets and 90 tablets would be a 45 day supply. We do not extend the truth as this is not legal to do so. If she is having issues affording the medication we can ask her Cardiologist about switching to an alternative.  ? ?Will call the pt and discuss this matter. There was no answer so left a message for the pt to call back regarding this matter.  ?

## 2022-01-18 NOTE — Progress Notes (Signed)
?  Wendy Munoz - 81 y.o. female MRN [id number]  Date of birth: December 22, 1940 ? ?SUBJECTIVE:  Including CC & ROS.  ?No chief complaint on file. ? ? ?Wendy Munoz is a 81 y.o. female that is following up for her osteoporosis.  She is doing better since her insufficiency fracture of the sacrum.  She continues to take the alendronate.  She denies any intolerance.  Most recent bone density revealed a T score of -1.9.  This is a minimal change from the bone density scan from 2022 that shows a T score of -1.8. ? ? ?Review of Systems ?See HPI  ? ?HISTORY: Past Medical, Surgical, Social, and Family History Reviewed & Updated per EMR.   ?Pertinent Historical Findings include: ? ?Past Medical History:  ?Diagnosis Date  ? Allergic state 06/26/2017  ? Anemia 05/21/2017  ? Arthritis   ? Atrial tachycardia ([place])   ? a. asymptomatic. Noted on tele during 11/2014 admission   ? CAD (coronary artery disease)   ? a. 11/2014 inf STEMI s/p DES to RCA; 50-70% ruptured plaque in pLAD- rx medically  ? GERD (gastroesophageal reflux disease)   ? HTN (hypertension)   ? Hyperglycemia   ? Hyperlipidemia   ? Hypothyroidism   ? Insomnia 05/27/2017  ? Ischemic cardiomyopathy   ? a. 11/2014  LV gram with inferior hypokinesis. EF 45-50%.  ? Nocturia 01/08/2017  ? Obesity 06/26/2017  ? Osteoporosis   ? PAF (paroxysmal atrial fibrillation) ([place])   ? a. isolated episode of atrial fibrillation several years ago associated w/ her thyroid dysfunction (prior to ablation).    ? Prediabetes   ? Preventative health care 06/26/2017  ? Vasomotor rhinitis 06/26/2017  ? ? ?Past Surgical History:  ?Procedure Laterality Date  ? BREAST SURGERY    ? breast biospy  ? CATARACT EXTRACTION Bilateral   ? COLONOSCOPY    ? EYE SURGERY Bilateral 2005, 2006  ? Cataract with lens ImplaNT   ? INGUINAL HERNIA REPAIR Right   ? LEFT HEART CATHETERIZATION WITH CORONARY ANGIOGRAM N/A 12/02/2014  ? Procedure: LEFT HEART CATHETERIZATION WITH CORONARY ANGIOGRAM;  Surgeon: Sinclair Grooms, MD;   Location: [facility] CATH LAB;  Service: Cardiovascular;  Laterality: N/A;  ? ORIF HUMERUS FRACTURE Right 01/27/2014  ? Procedure: RIGHT OPEN REDUCTION INTERNAL FIXATION (ORIF) PROXIMAL HUMERUS FRACTURE;  Surgeon: Rozanna Box, MD;  Location: [place];  Service: Orthopedics;  Laterality: Right;  ? TOTAL KNEE ARTHROPLASTY Right 2006  ? TOTAL KNEE ARTHROPLASTY Left 2011  ? UMBILICAL HERNIA REPAIR    ? as a baby  ? ? ? ?PHYSICAL EXAM:  ?VS: BP 130/70 (BP Location: Left Arm, Patient Position: Sitting)   Ht '5\' 2"'$  (1.575 m)   Wt 216 lb (98 kg)   BMI 39.51 kg/m?  ?Physical Exam ?Gen: NAD, alert, cooperative with exam, well-appearing ?MSK:  ?Neurovascularly intact   ? ? ? ? ?ASSESSMENT & PLAN:  ? ?Osteoporosis ?DEXA scan showing a minimal decrease in T score.  She is tolerating the alendronate.  Counseled on strength training and remaining active.  Most recent vitamin D is within normal limits. ?-Continue alendronate. ? ? ? ? ?

## 2022-01-18 NOTE — Assessment & Plan Note (Signed)
DEXA scan showing a minimal decrease in T score.  She is tolerating the alendronate.  Counseled on strength training and remaining active.  Most recent vitamin D is within normal limits. ?-Continue alendronate. ?

## 2022-01-29 ENCOUNTER — Other Ambulatory Visit: Payer: Self-pay | Admitting: Interventional Cardiology

## 2022-01-29 DIAGNOSIS — R0683 Snoring: Secondary | ICD-10-CM

## 2022-01-29 DIAGNOSIS — I10 Essential (primary) hypertension: Secondary | ICD-10-CM

## 2022-01-29 DIAGNOSIS — I480 Paroxysmal atrial fibrillation: Secondary | ICD-10-CM

## 2022-01-29 DIAGNOSIS — I2510 Atherosclerotic heart disease of native coronary artery without angina pectoris: Secondary | ICD-10-CM

## 2022-02-18 NOTE — Progress Notes (Signed)
Cardiology Office Note:    Date:  02/20/2022   ID:  Wendy Munoz, DOB [birth year], MRN [id number]  PCP:  Bradd Canary, MD  Cardiologist:  Lesleigh Noe, MD   Referring MD: Bradd Canary, MD   Chief Complaint  Patient presents with   Coronary Artery Disease    History of Present Illness:    Wendy Munoz is a 81 y.o. female with a hx of paroxysmal atrial fibrillation, acute inferior infarct April 2016 treated with proximal RCA DES, residual 60-75% segmental proximal LAD stenosis, hypertension, prediabetes, and chronic diastolic heart failure.  Multiple noncardiac complaints that have to do more with osteoarthritis and osteoporosis.  She has hand knee lower back and pelvic pain.  She denies angina, PND, orthopnea, and palpitations.  She does have left greater than right lower extremity edema.  It progresses as the day goes along, the left leg is worse than the right.  She has varicose veins in the left leg.  Past Medical History:  Diagnosis Date   Allergic state 06/26/2017   Anemia 05/21/2017   Arthritis    Atrial tachycardia ([facility])    a. asymptomatic. Noted on tele during 11/2014 admission    CAD (coronary artery disease)    a. 11/2014 inf STEMI s/p DES to RCA; 50-70% ruptured plaque in pLAD- rx medically   GERD (gastroesophageal reflux disease)    HTN (hypertension)    Hyperglycemia    Hyperlipidemia    Hypothyroidism    Insomnia 05/27/2017   Ischemic cardiomyopathy    a. 11/2014  LV gram with inferior hypokinesis. EF 45-50%.   Nocturia 01/08/2017   Obesity 06/26/2017   Osteoporosis    PAF (paroxysmal atrial fibrillation) (HCC)    a. isolated episode of atrial fibrillation several years ago associated w/ her thyroid dysfunction (prior to ablation).     Prediabetes    Preventative health care 06/26/2017   Vasomotor rhinitis 06/26/2017    Past Surgical History:  Procedure Laterality Date   BREAST SURGERY     breast biospy   CATARACT EXTRACTION Bilateral    COLONOSCOPY      EYE SURGERY Bilateral 2005, 2006   Cataract with lens ImplaNT    INGUINAL HERNIA REPAIR Right    LEFT HEART CATHETERIZATION WITH CORONARY ANGIOGRAM N/A 12/02/2014   Procedure: LEFT HEART CATHETERIZATION WITH CORONARY ANGIOGRAM;  Surgeon: Lesleigh Noe, MD;  Location: [facility] CATH LAB;  Service: Cardiovascular;  Laterality: N/A;   ORIF HUMERUS FRACTURE Right 01/27/2014   Procedure: RIGHT OPEN REDUCTION INTERNAL FIXATION (ORIF) PROXIMAL HUMERUS FRACTURE;  Surgeon: Budd Palmer, MD;  Location: [facility] OR;  Service: Orthopedics;  Laterality: Right;   TOTAL KNEE ARTHROPLASTY Right 2006   TOTAL KNEE ARTHROPLASTY Left 2011   UMBILICAL HERNIA REPAIR     as a baby    Current Medications: Current Meds  Medication Sig   acyclovir ointment (ZOVIRAX) 5 % Apply small amount to affected 5 x daily x 4 days and as needed cold sore   alendronate (FOSAMAX) 70 MG tablet Take 1 tablet (70 mg total) by mouth every 7 (seven) days.   apixaban (ELIQUIS) 5 MG TABS tablet Take 1 tablet (5 mg total) by mouth 2 (two) times daily.   Calcium-Magnesium-Vitamin D (CALCIUM MAGNESIUM PO) Take 1 capsule by mouth daily.   cetirizine (ZYRTEC) 10 MG tablet Take 10 mg by mouth at bedtime.   Cholecalciferol (VITAMIN D) 2000 UNITS tablet Take 6,000 Units by mouth daily.   Coenzyme  Q10 200 MG TABS Take 200 mg by mouth daily.   levothyroxine (SYNTHROID) 88 MCG tablet TAKE 1 TABLET (88 MCG TOTAL) BY MOUTH DAILY BEFORE BREAKFAST. TAKE 1 TABLET BY MOUTH DAILY EXCEPT FOR SATURDAY (SKIP DOSE).   metoprolol tartrate (LOPRESSOR) 25 MG tablet TAKE 1 TABLET BY MOUTH TWICE A DAY   metroNIDAZOLE (METROGEL) 1 % gel Apply topically daily.   nystatin cream (MYCOSTATIN) Apply 1 application topically 2 (two) times daily as needed for dry skin (rash on abdomen).   rosuvastatin (CRESTOR) 40 MG tablet TAKE 1/2 TABLET BY MOUTH EVERY DAY   Tdap (BOOSTRIX) 5-2.5-18.5 LF-MCG/0.5 injection Inject into the muscle.   [DISCONTINUED] nitroGLYCERIN (NITROSTAT)  0.4 MG SL tablet Place 1 tablet (0.4 mg total) under the tongue every 5 (five) minutes as needed for chest pain.     Allergies:   Keflex [cephalexin]   Social History   Socioeconomic History   Marital status: Married    Spouse name: Not on file   Number of children: Not on file   Years of education: Not on file   Highest education level: Not on file  Occupational History   Occupation: retired   Tobacco Use   Smoking status: Never   Smokeless tobacco: Never  Vaping Use   Vaping Use: Never used  Substance and Sexual Activity   Alcohol use: No    Alcohol/week: 0.0 standard drinks   Drug use: No   Sexual activity: Yes    Partners: Male  Other Topics Concern   Not on file  Social History Narrative   Not on file   Social Determinants of Health   Financial Resource Strain: Low Risk    Difficulty of Paying Living Expenses: Not hard at all  Food Insecurity: No Food Insecurity   Worried About Programme researcher, broadcasting/film/video in the Last Year: Never true   Ran Out of Food in the Last Year: Never true  Transportation Needs: No Transportation Needs   Lack of Transportation (Medical): No   Lack of Transportation (Non-Medical): No  Physical Activity: Sufficiently Active   Days of Exercise per Week: 4 days   Minutes of Exercise per Session: 60 min  Stress: No Stress Concern Present   Feeling of Stress : Not at all  Social Connections: Socially Integrated   Frequency of Communication with Friends and Family: More than three times a week   Frequency of Social Gatherings with Friends and Family: More than three times a week   Attends Religious Services: More than 4 times per year   Active Member of [employer] or Organizations: Yes   Attends Engineer, structural: More than 4 times per year   Marital Status: Married     Family History: The patient's family history includes Arthritis in her maternal aunt; Atrial fibrillation in her daughter; Breast cancer in her maternal aunt and another  family member; Breast cancer (age of onset: 51) in her sister; CAD in her mother; Cancer in her sister; Diabetes in her paternal grandfather; Heart disease in her brother and mother; Heart failure in her mother; Hyperlipidemia in her sister; Hypertension in her brother and sister; Lung cancer in her paternal grandmother; Multiple myeloma in her father; Neuropathy in her sister; Polycythemia in her father; Prostate cancer in her paternal grandfather; Stroke in her sister.  ROS:   Please see the history of present illness.    Aches and pains.  Sleeping okay.  Not requiring sublingual nitroglycerin.  All other systems reviewed and are negative.  EKGs/Labs/Other Studies Reviewed:    The following studies were reviewed today: No new data  EKG:  EKG normal sinus rhythm, small inferior Q waves, early QRS transition with none significant anterior Q waves.  Compared to January 06, 2021, there is no significant change.  Low voltage persists.  Recent Labs: 11/01/2021: ALT 10; BUN 18; Creatinine, Ser 1.02; Hemoglobin 11.7; Platelets 183.0; Potassium 4.1; Sodium 139; TSH 3.30  Recent Lipid Panel    Component Value Date/Time   CHOL 194 11/01/2021 1419   TRIG 100.0 11/01/2021 1419   HDL 83.30 11/01/2021 1419   CHOLHDL 2 11/01/2021 1419   VLDL 20.0 11/01/2021 1419   LDLCALC 90 11/01/2021 1419    Physical Exam:    VS:  BP 126/82   Pulse 61   Ht 5\' 1"  (1.549 m)   Wt 214 lb 9.6 oz (97.3 kg)   SpO2 96%   BMI 40.55 kg/m     Wt Readings from Last 3 Encounters:  02/20/22 214 lb 9.6 oz (97.3 kg)  01/18/22 216 lb (98 kg)  11/01/21 216 lb 12.8 oz (98.3 kg)     GEN: Morbidly obese. No acute distress HEENT: Normal NECK: No JVD. LYMPHATICS: No lymphadenopathy CARDIAC: No murmur. RRR no gallop, but with left ankle edema.  No edema is noted on the right. VASCULAR:  Normal Pulses. No bruits. RESPIRATORY:  Clear to auscultation without rales, wheezing or rhonchi  ABDOMEN: Soft, non-tender,  non-distended, No pulsatile mass, MUSCULOSKELETAL: No deformity  SKIN: Warm and dry NEUROLOGIC:  Alert and oriented x 3 PSYCHIATRIC:  Normal affect   ASSESSMENT:    1. PAF (paroxysmal atrial fibrillation) (HCC)   2. Coronary artery disease involving native coronary artery of native heart without angina pectoris   3. Essential hypertension   4. Hyperlipidemia, unspecified hyperlipidemia type   5. On apixaban therapy    PLAN:    In order of problems listed above:  No significant instances of atrial fibrillation clinically.  Has an elevated chads VASc score and therefore anticoagulation should be continued. Secondary prevention reviewed. Blood pressure control is adequate. Continue high intensity statin therapy with rosuvastatin 20 mg daily Continue apixaban.  Most recent hemoglobin and creatinine were stable in January.  Overall education and awareness concerning primary/secondary risk prevention was discussed in detail: LDL less than 70, hemoglobin A1c less than 7, blood pressure target less than 130/80 mmHg, >150 minutes of moderate aerobic activity per week, avoidance of smoking, weight control (via diet and exercise), and continued surveillance/management of/for obstructive sleep apnea.    Medication Adjustments/Labs and Tests Ordered: Current medicines are reviewed at length with the patient today.  Concerns regarding medicines are outlined above.  Orders Placed This Encounter  Procedures   EKG 12-Lead   Meds ordered this encounter  Medications   nitroGLYCERIN (NITROSTAT) 0.4 MG SL tablet    Sig: Place 1 tablet (0.4 mg total) under the tongue every 5 (five) minutes as needed for chest pain.    Dispense:  25 tablet    Refill:  3    Patient Instructions  Medication Instructions:  Your physician recommends that you continue on your current medications as directed. Please refer to the Current Medication list given to you today.  *If you need a refill on your cardiac  medications before your next appointment, please call your pharmacy*  Lab Work: NONE  Testing/Procedures: NONE  Follow-Up: At [facility], you and your health needs are our priority.  As part of our continuing mission to  provide you with exceptional heart care, we have created designated Provider Care Teams.  These Care Teams include your primary Cardiologist (physician) and Advanced Practice Providers (APPs -  Physician Assistants and Nurse Practitioners) who all work together to provide you with the care you need, when you need it.  Your next appointment:   1 year(s)  The format for your next appointment:   In Person  Provider:   Lesleigh Noe, MD {   Important Information About Sugar         Signed, Lesleigh Noe, MD  02/20/2022 1:48 PM    [facility]

## 2022-02-20 ENCOUNTER — Ambulatory Visit: Payer: PPO | Admitting: Interventional Cardiology

## 2022-02-20 ENCOUNTER — Encounter: Payer: Self-pay | Admitting: Interventional Cardiology

## 2022-02-20 VITALS — BP 126/82 | HR 61 | Ht 61.0 in | Wt 214.6 lb

## 2022-02-20 DIAGNOSIS — E785 Hyperlipidemia, unspecified: Secondary | ICD-10-CM

## 2022-02-20 DIAGNOSIS — I480 Paroxysmal atrial fibrillation: Secondary | ICD-10-CM

## 2022-02-20 DIAGNOSIS — Z7901 Long term (current) use of anticoagulants: Secondary | ICD-10-CM

## 2022-02-20 DIAGNOSIS — I10 Essential (primary) hypertension: Secondary | ICD-10-CM

## 2022-02-20 DIAGNOSIS — I2510 Atherosclerotic heart disease of native coronary artery without angina pectoris: Secondary | ICD-10-CM

## 2022-02-20 MED ORDER — NITROGLYCERIN 0.4 MG SL SUBL: 0.4000 mg | SUBLINGUAL_TABLET | SUBLINGUAL | 3 refills | Status: DC | PRN

## 2022-02-20 NOTE — Patient Instructions (Signed)

## 2022-03-04 ENCOUNTER — Other Ambulatory Visit: Payer: Self-pay | Admitting: Family Medicine

## 2022-03-15 NOTE — Progress Notes (Unsigned)
Subjective:    Patient ID: Wendy Munoz, female    DOB: 1941-09-27, 81 y.o.   MRN: [id number]  No chief complaint on file.   HPI Patient is in today for a follow up.  Past Medical History:  Diagnosis Date   Allergic state 06/26/2017   Anemia 05/21/2017   Arthritis    Atrial tachycardia ([place])    a. asymptomatic. Noted on tele during 11/2014 admission    CAD (coronary artery disease)    a. 11/2014 inf STEMI s/p DES to RCA; 50-70% ruptured plaque in pLAD- rx medically   GERD (gastroesophageal reflux disease)    HTN (hypertension)    Hyperglycemia    Hyperlipidemia    Hypothyroidism    Insomnia 05/27/2017   Ischemic cardiomyopathy    a. 11/2014  LV gram with inferior hypokinesis. EF 45-50%.   Nocturia 01/08/2017   Obesity 06/26/2017   Osteoporosis    PAF (paroxysmal atrial fibrillation) ([place])    a. isolated episode of atrial fibrillation several years ago associated w/ her thyroid dysfunction (prior to ablation).     Prediabetes    Preventative health care 06/26/2017   Vasomotor rhinitis 06/26/2017    Past Surgical History:  Procedure Laterality Date   BREAST SURGERY     breast biospy   CATARACT EXTRACTION Bilateral    COLONOSCOPY     EYE SURGERY Bilateral 2005, 2006   Cataract with lens ImplaNT    INGUINAL HERNIA REPAIR Right    LEFT HEART CATHETERIZATION WITH CORONARY ANGIOGRAM N/A 12/02/2014   Procedure: LEFT HEART CATHETERIZATION WITH CORONARY ANGIOGRAM;  Surgeon: Sinclair Grooms, MD;  Location: [facility] CATH LAB;  Service: Cardiovascular;  Laterality: N/A;   ORIF HUMERUS FRACTURE Right 01/27/2014   Procedure: RIGHT OPEN REDUCTION INTERNAL FIXATION (ORIF) PROXIMAL HUMERUS FRACTURE;  Surgeon: Rozanna Box, MD;  Location: [place];  Service: Orthopedics;  Laterality: Right;   TOTAL KNEE ARTHROPLASTY Right 2006   TOTAL KNEE ARTHROPLASTY Left 2119   UMBILICAL HERNIA REPAIR     as a baby    Family History  Problem Relation Age of Onset   CAD Mother    Heart disease Mother     Heart failure Mother    Polycythemia Father    Multiple myeloma Father    Cancer Sister        breast cancer   Stroke Sister    Hyperlipidemia Sister    Hypertension Sister    Breast cancer Sister 30   Neuropathy Sister    Heart disease Brother        bradycardia   Hypertension Brother    Atrial fibrillation Daughter    Arthritis Maternal Aunt    Breast cancer Maternal Aunt    Lung cancer Paternal Grandmother    Prostate cancer Paternal Grandfather    Diabetes Paternal Grandfather    Breast cancer Other     Social History   Socioeconomic History   Marital status: Married    Spouse name: Not on file   Number of children: Not on file   Years of education: Not on file   Highest education level: Not on file  Occupational History   Occupation: retired   Tobacco Use   Smoking status: Never   Smokeless tobacco: Never  Vaping Use   Vaping Use: Never used  Substance and Sexual Activity   Alcohol use: No    Alcohol/week: 0.0 standard drinks   Drug use: No   Sexual activity: Yes    Partners:  Male  Other Topics Concern   Not on file  Social History Narrative   Not on file   Social Determinants of Health   Financial Resource Strain: Low Risk    Difficulty of Paying Living Expenses: Not hard at all  Food Insecurity: No Food Insecurity   Worried About [place] in the Last Year: Never true   [place] in the Last Year: Never true  Transportation Needs: No Transportation Needs   Lack of Transportation (Medical): No   Lack of Transportation (Non-Medical): No  Physical Activity: Sufficiently Active   Days of Exercise per Week: 4 days   Minutes of Exercise per Session: 60 min  Stress: No Stress Concern Present   Feeling of Stress : Not at all  Social Connections: Socially Integrated   Frequency of Communication with Friends and Family: More than three times a week   Frequency of Social Gatherings with Friends and Family: More than three times a week    Attends Religious Services: More than 4 times per year   Active Member of [employer] or Organizations: Yes   Attends Music therapist: More than 4 times per year   Marital Status: Married  Human resources officer Violence: Not At Risk   Fear of Current or Ex-Partner: No   Emotionally Abused: No   Physically Abused: No   Sexually Abused: No    Outpatient Medications Prior to Visit  Medication Sig Dispense Refill   acyclovir ointment (ZOVIRAX) 5 % Apply small amount to affected 5 x daily x 4 days and as needed cold sore 30 g 1   alendronate (FOSAMAX) 70 MG tablet Take 1 tablet (70 mg total) by mouth every 7 (seven) days. 4 tablet 11   apixaban (ELIQUIS) 5 MG TABS tablet Take 1 tablet (5 mg total) by mouth 2 (two) times daily. 180 tablet 1   Calcium-Magnesium-Vitamin D (CALCIUM MAGNESIUM PO) Take 1 capsule by mouth daily.     cetirizine (ZYRTEC) 10 MG tablet Take 10 mg by mouth at bedtime.     Cholecalciferol (VITAMIN D) 2000 UNITS tablet Take 6,000 Units by mouth daily.     Coenzyme Q10 200 MG TABS Take 200 mg by mouth daily.     levothyroxine (SYNTHROID) 88 MCG tablet TAKE 1 TABLET BY MOUTH EVERY DAY BEFORE BREAKFAST SKIP THE SATURDAY DOSE 90 tablet 1   metoprolol tartrate (LOPRESSOR) 25 MG tablet TAKE 1 TABLET BY MOUTH TWICE A DAY 180 tablet 3   metroNIDAZOLE (METROGEL) 1 % gel Apply topically daily. 45 g 2   nitroGLYCERIN (NITROSTAT) 0.4 MG SL tablet Place 1 tablet (0.4 mg total) under the tongue every 5 (five) minutes as needed for chest pain. 25 tablet 3   nystatin cream (MYCOSTATIN) Apply 1 application topically 2 (two) times daily as needed for dry skin (rash on abdomen). 30 g 2   rosuvastatin (CRESTOR) 40 MG tablet TAKE 1/2 TABLET BY MOUTH EVERY DAY 45 tablet 1   Tdap (BOOSTRIX) 5-2.5-18.5 LF-MCG/0.5 injection Inject into the muscle. 0.5 mL 0   No facility-administered medications prior to visit.    Allergies  Allergen Reactions   Keflex [Cephalexin] Rash    ROS      Objective:    Physical Exam  There were no vitals taken for this visit. Wt Readings from Last 3 Encounters:  02/20/22 214 lb 9.6 oz (97.3 kg)  01/18/22 216 lb (98 kg)  11/01/21 216 lb 12.8 oz (98.3 kg)    Diabetic  Foot Exam - Simple   No data filed    Lab Results  Component Value Date   WBC 4.8 11/01/2021   HGB 11.7 (L) 11/01/2021   HCT 35.2 (L) 11/01/2021   PLT 183.0 11/01/2021   GLUCOSE 108 (H) 11/01/2021   CHOL 194 11/01/2021   TRIG 100.0 11/01/2021   HDL 83.30 11/01/2021   LDLCALC 90 11/01/2021   ALT 10 11/01/2021   AST 20 11/01/2021   NA 139 11/01/2021   K 4.1 11/01/2021   CL 103 11/01/2021   CREATININE 1.02 11/01/2021   BUN 18 11/01/2021   CO2 28 11/01/2021   TSH 3.30 11/01/2021   INR 1.05 01/27/2014   HGBA1C 6.2 11/01/2021    Lab Results  Component Value Date   TSH 3.30 11/01/2021   Lab Results  Component Value Date   WBC 4.8 11/01/2021   HGB 11.7 (L) 11/01/2021   HCT 35.2 (L) 11/01/2021   MCV 97.1 11/01/2021   PLT 183.0 11/01/2021   Lab Results  Component Value Date   NA 139 11/01/2021   K 4.1 11/01/2021   CO2 28 11/01/2021   GLUCOSE 108 (H) 11/01/2021   BUN 18 11/01/2021   CREATININE 1.02 11/01/2021   BILITOT 0.6 11/01/2021   ALKPHOS 73 11/01/2021   AST 20 11/01/2021   ALT 10 11/01/2021   PROT 6.7 11/01/2021   ALBUMIN 4.1 11/01/2021   CALCIUM 9.0 11/01/2021   ANIONGAP 7 04/15/2015   GFR 52.12 (L) 11/01/2021   Lab Results  Component Value Date   CHOL 194 11/01/2021   Lab Results  Component Value Date   HDL 83.30 11/01/2021   Lab Results  Component Value Date   LDLCALC 90 11/01/2021   Lab Results  Component Value Date   TRIG 100.0 11/01/2021   Lab Results  Component Value Date   CHOLHDL 2 11/01/2021   Lab Results  Component Value Date   HGBA1C 6.2 11/01/2021       Assessment & Plan:   Problem List Items Addressed This Visit   None   I am having Wendy Munoz. Wendy Munoz maintain her cetirizine, Vitamin D,  Calcium-Magnesium-Vitamin D (CALCIUM MAGNESIUM PO), Coenzyme Q10, acyclovir ointment, nystatin cream, metroNIDAZOLE, alendronate, rosuvastatin, Boostrix, apixaban, metoprolol tartrate, nitroGLYCERIN, and levothyroxine.  No orders of the defined types were placed in this encounter.

## 2022-03-16 ENCOUNTER — Encounter: Payer: Self-pay | Admitting: Family Medicine

## 2022-03-16 ENCOUNTER — Ambulatory Visit (INDEPENDENT_AMBULATORY_CARE_PROVIDER_SITE_OTHER): Payer: PPO | Admitting: Family Medicine

## 2022-03-16 VITALS — BP 134/74 | HR 61 | Resp 20 | Ht 61.0 in | Wt 216.4 lb

## 2022-03-16 DIAGNOSIS — E785 Hyperlipidemia, unspecified: Secondary | ICD-10-CM | POA: Diagnosis not present

## 2022-03-16 DIAGNOSIS — D539 Nutritional anemia, unspecified: Secondary | ICD-10-CM

## 2022-03-16 DIAGNOSIS — M8000XD Age-related osteoporosis with current pathological fracture, unspecified site, subsequent encounter for fracture with routine healing: Secondary | ICD-10-CM | POA: Diagnosis not present

## 2022-03-16 DIAGNOSIS — E039 Hypothyroidism, unspecified: Secondary | ICD-10-CM

## 2022-03-16 DIAGNOSIS — I10 Essential (primary) hypertension: Secondary | ICD-10-CM | POA: Diagnosis not present

## 2022-03-16 DIAGNOSIS — D649 Anemia, unspecified: Secondary | ICD-10-CM | POA: Diagnosis not present

## 2022-03-16 DIAGNOSIS — R739 Hyperglycemia, unspecified: Secondary | ICD-10-CM

## 2022-03-16 NOTE — Patient Instructions (Signed)

## 2022-03-16 NOTE — Progress Notes (Signed)
Subjective:   By signing my name below, I, Wendy Munoz, attest that this documentation has been prepared under the direction and in the presence of Wendy Lukes, MD. 03/16/2022       Patient ID: Wendy Munoz, female    DOB: 1941-09-30, 81 y.o.   MRN: [id number]  Chief Complaint  Patient presents with   Follow-up    HPI Patient is in today for an office visit and 4 months f/u. No recent illnesses or visits.   She reports her feet are still swollen, with the left being worse than the right. Notes the swelling is still relatively the same. She bought compression stockings but they were too uncomfortable. She puts her feet up at night while sleeping and adds they are better in the morning. She also notes some varicose veins in her left shin that are non-tender.   She takes multivitamins that contain iron and B12 pills.   She reports she often gets heartburn when she takes 70 mg fosamax. She is not sure if arthralgias are due to the fosamax or arthritis.   Past Medical History:  Diagnosis Date   Allergic state 06/26/2017   Anemia 05/21/2017   Arthritis    Atrial tachycardia ([place])    a. asymptomatic. Noted on tele during 11/2014 admission    CAD (coronary artery disease)    a. 11/2014 inf STEMI s/p DES to RCA; 50-70% ruptured plaque in pLAD- rx medically   GERD (gastroesophageal reflux disease)    HTN (hypertension)    Hyperglycemia    Hyperlipidemia    Hypothyroidism    Insomnia 05/27/2017   Ischemic cardiomyopathy    a. 11/2014  LV gram with inferior hypokinesis. EF 45-50%.   Nocturia 01/08/2017   Obesity 06/26/2017   Osteoporosis    PAF (paroxysmal atrial fibrillation) ([place])    a. isolated episode of atrial fibrillation several years ago associated w/ her thyroid dysfunction (prior to ablation).     Prediabetes    Preventative health care 06/26/2017   Vasomotor rhinitis 06/26/2017    Past Surgical History:  Procedure Laterality Date   BREAST SURGERY     breast biospy    CATARACT EXTRACTION Bilateral    COLONOSCOPY     EYE SURGERY Bilateral 2005, 2006   Cataract with lens ImplaNT    INGUINAL HERNIA REPAIR Right    LEFT HEART CATHETERIZATION WITH CORONARY ANGIOGRAM N/A 12/02/2014   Procedure: LEFT HEART CATHETERIZATION WITH CORONARY ANGIOGRAM;  Surgeon: Sinclair Grooms, MD;  Location[facility] LAB;  Service: Cardiovascular;  Laterality: N/A;   ORIF HUMERUS FRACTURE Right 01/27/2014   Procedure: RIGHT OPEN REDUCTION INTERNAL FIXATION (ORIF) PROXIMAL HUMERUS FRACTURE;  Surgeon: Rozanna Box, MD;  Location: [facility];  Service: Orthopedics;  Laterality: Right;   TOTAL KNEE ARTHROPLASTY Right 2006   TOTAL KNEE ARTHROPLASTY Left 3664   UMBILICAL HERNIA REPAIR     as a baby    Family History  Problem Relation Age of Onset   CAD Mother    Heart disease Mother    Heart failure Mother    Polycythemia Father    Multiple myeloma Father    Cancer Sister        breast cancer   Stroke Sister    Hyperlipidemia Sister    Hypertension Sister    Breast cancer Sister 41   Neuropathy Sister    Heart disease Brother        bradycardia   Hypertension Brother  Atrial fibrillation Daughter    Arthritis Maternal Aunt    Breast cancer Maternal Aunt    Lung cancer Paternal Grandmother    Prostate cancer Paternal Grandfather    Diabetes Paternal Grandfather    Breast cancer Other     Social History   Socioeconomic History   Marital status: Married    Spouse name: Not on file   Number of children: Not on file   Years of education: Not on file   Highest education level: Not on file  Occupational History   Occupation: retired   Tobacco Use   Smoking status: Never   Smokeless tobacco: Never  Vaping Use   Vaping Use: Never used  Substance and Sexual Activity   Alcohol use: No    Alcohol/week: 0.0 standard drinks   Drug use: No   Sexual activity: Yes    Partners: Male  Other Topics Concern   Not on file  Social History Narrative   Not on file   Social  Determinants of Health   Financial Resource Strain: Low Risk    Difficulty of Paying Living Expenses: Not hard at all  Food Insecurity: No Food Insecurity   Worried About Charity fundraiser in the Last Year: Never true   [place] in the Last Year: Never true  Transportation Needs: No Transportation Needs   Lack of Transportation (Medical): No   Lack of Transportation (Non-Medical): No  Physical Activity: Sufficiently Active   Days of Exercise per Week: 4 days   Minutes of Exercise per Session: 60 min  Stress: No Stress Concern Present   Feeling of Stress : Not at all  Social Connections: Socially Integrated   Frequency of Communication with Friends and Family: More than three times a week   Frequency of Social Gatherings with Friends and Family: More than three times a week   Attends Religious Services: More than 4 times per year   Active Member of [employer] or Organizations: Yes   Attends Music therapist: More than 4 times per year   Marital Status: Married  Human resources officer Violence: Not At Risk   Fear of Current or Ex-Partner: No   Emotionally Abused: No   Physically Abused: No   Sexually Abused: No    Outpatient Medications Prior to Visit  Medication Sig Dispense Refill   acyclovir ointment (ZOVIRAX) 5 % Apply small amount to affected 5 x daily x 4 days and as needed cold sore 30 g 1   alendronate (FOSAMAX) 70 MG tablet Take 1 tablet (70 mg total) by mouth every 7 (seven) days. 4 tablet 11   apixaban (ELIQUIS) 5 MG TABS tablet Take 1 tablet (5 mg total) by mouth 2 (two) times daily. 180 tablet 1   Calcium-Magnesium-Vitamin D (CALCIUM MAGNESIUM PO) Take 1 capsule by mouth daily.     cetirizine (ZYRTEC) 10 MG tablet Take 10 mg by mouth at bedtime.     Cholecalciferol (VITAMIN D) 2000 UNITS tablet Take 6,000 Units by mouth daily.     Coenzyme Q10 200 MG TABS Take 200 mg by mouth daily.     levothyroxine (SYNTHROID) 88 MCG tablet TAKE 1 TABLET BY MOUTH EVERY  DAY BEFORE BREAKFAST SKIP THE SATURDAY DOSE 90 tablet 1   metoprolol tartrate (LOPRESSOR) 25 MG tablet TAKE 1 TABLET BY MOUTH TWICE A DAY 180 tablet 3   metroNIDAZOLE (METROGEL) 1 % gel Apply topically daily. 45 g 2   nitroGLYCERIN (NITROSTAT) 0.4 MG SL tablet Place  1 tablet (0.4 mg total) under the tongue every 5 (five) minutes as needed for chest pain. 25 tablet 3   nystatin cream (MYCOSTATIN) Apply 1 application topically 2 (two) times daily as needed for dry skin (rash on abdomen). 30 g 2   rosuvastatin (CRESTOR) 40 MG tablet TAKE 1/2 TABLET BY MOUTH EVERY DAY 45 tablet 1   Tdap (BOOSTRIX) 5-2.5-18.5 LF-MCG/0.5 injection Inject into the muscle. 0.5 mL 0   No facility-administered medications prior to visit.    Allergies  Allergen Reactions   Keflex [Cephalexin] Rash    Review of Systems  Constitutional:  Negative for fever and malaise/fatigue.  HENT:  Negative for congestion.   Eyes:  Negative for redness.  Respiratory:  Negative for shortness of breath.   Cardiovascular:  Positive for leg swelling. Negative for chest pain and palpitations.  Gastrointestinal:  Negative for abdominal pain, blood in stool and nausea.  Genitourinary:  Negative for dysuria and frequency.  Musculoskeletal:  Negative for falls.  Skin:  Negative for rash.  Neurological:  Negative for dizziness, loss of consciousness and headaches.  Endo/Heme/Allergies:  Negative for polydipsia.  Psychiatric/Behavioral:  Negative for depression. The patient is not nervous/anxious.       Objective:    Physical Exam Constitutional:      General: She is not in acute distress.    Appearance: She is well-developed.  HENT:     Head: Normocephalic and atraumatic.  Eyes:     Conjunctiva/sclera: Conjunctivae normal.  Neck:     Thyroid: No thyromegaly.  Cardiovascular:     Rate and Rhythm: Normal rate and regular rhythm.     Heart sounds: Normal heart sounds. No murmur heard. Pulmonary:     Effort: Pulmonary effort  is normal. No respiratory distress.     Breath sounds: Normal breath sounds.  Abdominal:     General: Bowel sounds are normal. There is no distension.     Palpations: Abdomen is soft. There is no mass.     Tenderness: There is no abdominal tenderness.  Musculoskeletal:     Cervical back: Neck supple.     Right lower leg: 2+ Edema present.     Left lower leg: 3+ Edema present.  Lymphadenopathy:     Cervical: No cervical adenopathy.  Skin:    General: Skin is warm and dry.  Neurological:     Mental Status: She is alert and oriented to person, place, and time.  Psychiatric:        Behavior: Behavior normal.    BP 134/74 (BP Location: Left Arm, Patient Position: Sitting, Cuff Size: Normal)   Pulse 61   Resp 20   Ht $R'5\' 1"'Ud$  (1.549 m)   Wt 216 lb 6.4 oz (98.2 kg)   SpO2 98%   BMI 40.89 kg/m  Wt Readings from Last 3 Encounters:  03/16/22 216 lb 6.4 oz (98.2 kg)  02/20/22 214 lb 9.6 oz (97.3 kg)  01/18/22 216 lb (98 kg)    Diabetic Foot Exam - Simple   No data filed    Lab Results  Component Value Date   WBC 5.5 03/16/2022   HGB 12.1 03/16/2022   HCT 36.4 03/16/2022   PLT 202.0 03/16/2022   GLUCOSE 104 (H) 03/16/2022   CHOL 196 03/16/2022   TRIG 99.0 03/16/2022   HDL 80.90 03/16/2022   LDLCALC 95 03/16/2022   ALT 17 03/16/2022   AST 27 03/16/2022   NA 138 03/16/2022   K 4.1 03/16/2022   CL 100  03/16/2022   CREATININE 1.15 03/16/2022   BUN 17 03/16/2022   CO2 28 03/16/2022   TSH 4.84 03/16/2022   INR 1.05 01/27/2014   HGBA1C 6.2 11/01/2021    Lab Results  Component Value Date   TSH 4.84 03/16/2022   Lab Results  Component Value Date   WBC 5.5 03/16/2022   HGB 12.1 03/16/2022   HCT 36.4 03/16/2022   MCV 96.0 03/16/2022   PLT 202.0 03/16/2022   Lab Results  Component Value Date   NA 138 03/16/2022   K 4.1 03/16/2022   CO2 28 03/16/2022   GLUCOSE 104 (H) 03/16/2022   BUN 17 03/16/2022   CREATININE 1.15 03/16/2022   BILITOT 0.8 03/16/2022   ALKPHOS  60 03/16/2022   AST 27 03/16/2022   ALT 17 03/16/2022   PROT 7.5 03/16/2022   ALBUMIN 4.4 03/16/2022   CALCIUM 9.7 03/16/2022   ANIONGAP 7 04/15/2015   GFR 45.02 (L) 03/16/2022   Lab Results  Component Value Date   CHOL 196 03/16/2022   Lab Results  Component Value Date   HDL 80.90 03/16/2022   Lab Results  Component Value Date   LDLCALC 95 03/16/2022   Lab Results  Component Value Date   TRIG 99.0 03/16/2022   Lab Results  Component Value Date   CHOLHDL 2 03/16/2022   Lab Results  Component Value Date   HGBA1C 6.2 11/01/2021       Assessment & Plan:   Problem List Items Addressed This Visit     Hypothyroidism    On Levothyroxine, continue to monitor       Relevant Orders   TSH (Completed)   Hyperglycemia    hgba1c acceptable, minimize simple carbs. Increase exercise as tolerated.        HTN (hypertension) - Primary    Well controlled, no changes to meds. Encouraged heart healthy diet such as the DASH diet and exercise as tolerated.        Relevant Orders   Comprehensive metabolic panel (Completed)   CBC (Completed)   Vitamin B12 (Completed)   Osteoporosis    Encouraged to get adequate exercise, calcium and vitamin d intake       Macrocytic anemia    Continue to monitor, normal vitamin B 12 level       Relevant Orders   Vitamin B12 (Completed)   Hyperlipidemia    Encourage heart healthy diet such as MIND or DASH diet, increase exercise, avoid trans fats, simple carbohydrates and processed foods, consider a krill or fish or flaxseed oil cap daily.        Relevant Orders   Lipid panel (Completed)      No orders of the defined types were placed in this encounter.   I,Wendy Munoz,acting as a Education administrator for Penni Homans, MD.,have documented all relevant documentation on the behalf of Penni Homans, MD,as directed by  Penni Homans, MD while in the presence of Penni Homans, MD.   I, Wendy Lukes, MD., personally preformed the services  described in this documentation.  All medical record entries made by the scribe were at my direction and in my presence.  I have reviewed the chart and discharge instructions (if applicable) and agree that the record reflects my personal performance and is accurate and complete. 03/16/2022

## 2022-03-17 LAB — CBC
HCT: 36.4 % (ref 36.0–46.0)
Hemoglobin: 12.1 g/dL (ref 12.0–15.0)
MCHC: 33.4 g/dL (ref 30.0–36.0)
MCV: 96 fl (ref 78.0–100.0)
Platelets: 202 10*3/uL (ref 150.0–400.0)
RBC: 3.79 Mil/uL — ABNORMAL LOW (ref 3.87–5.11)
RDW: 13.8 % (ref 11.5–15.5)
WBC: 5.5 10*3/uL (ref 4.0–10.5)

## 2022-03-17 LAB — COMPREHENSIVE METABOLIC PANEL
ALT: 17 U/L (ref 0–35)
AST: 27 U/L (ref 0–37)
Albumin: 4.4 g/dL (ref 3.5–5.2)
Alkaline Phosphatase: 60 U/L (ref 39–117)
BUN: 17 mg/dL (ref 6–23)
CO2: 28 mEq/L (ref 19–32)
Calcium: 9.7 mg/dL (ref 8.4–10.5)
Chloride: 100 mEq/L (ref 96–112)
Creatinine, Ser: 1.15 mg/dL (ref 0.40–1.20)
GFR: 45.02 mL/min — ABNORMAL LOW (ref >60.00)
Glucose, Bld: 104 mg/dL — ABNORMAL HIGH (ref 70–99)
Potassium: 4.1 mEq/L (ref 3.5–5.1)
Sodium: 138 mEq/L (ref 135–145)
Total Bilirubin: 0.8 mg/dL (ref 0.2–1.2)
Total Protein: 7.5 g/dL (ref 6.0–8.3)

## 2022-03-17 LAB — LIPID PANEL
Cholesterol: 196 mg/dL (ref 0–200)
HDL: 80.9 mg/dL (ref >39.00)
LDL Cholesterol: 95 mg/dL (ref 0–99)
NonHDL: 114.72
Total CHOL/HDL Ratio: 2
Triglycerides: 99 mg/dL (ref 0.0–149.0)
VLDL: 19.8 mg/dL (ref 0.0–40.0)

## 2022-03-17 LAB — IRON,TIBC AND FERRITIN PANEL
%SAT: 22 % (calc) (ref 16–45)
Ferritin: 112 ng/mL (ref 16–288)
Iron: 84 ug/dL (ref 45–160)
TIBC: 379 mcg/dL (calc) (ref 250–450)

## 2022-03-17 LAB — TSH: TSH: 4.84 u[IU]/mL (ref 0.35–5.50)

## 2022-03-17 LAB — VITAMIN B12: Vitamin B-12: 880 pg/mL (ref 211–911)

## 2022-03-19 ENCOUNTER — Other Ambulatory Visit: Payer: Self-pay | Admitting: Family Medicine

## 2022-03-20 NOTE — Assessment & Plan Note (Signed)
Well controlled, no changes to meds. Encouraged heart healthy diet such as the DASH diet and exercise as tolerated.  °

## 2022-03-20 NOTE — Assessment & Plan Note (Signed)
Encouraged to get adequate exercise, calcium and vitamin d intake 

## 2022-03-20 NOTE — Assessment & Plan Note (Signed)
hgba1c acceptable, minimize simple carbs. Increase exercise as tolerated.  

## 2022-03-20 NOTE — Assessment & Plan Note (Signed)
On Levothyroxine, continue to monitor 

## 2022-03-20 NOTE — Assessment & Plan Note (Signed)
Continue to monitor, normal vitamin B 12 level

## 2022-03-20 NOTE — Assessment & Plan Note (Signed)
Encourage heart healthy diet such as MIND or DASH diet, increase exercise, avoid trans fats, simple carbohydrates and processed foods, consider a krill or fish or flaxseed oil cap daily.  °

## 2022-07-04 ENCOUNTER — Encounter: Payer: PPO | Admitting: Family Medicine

## 2022-07-09 ENCOUNTER — Other Ambulatory Visit: Payer: Self-pay | Admitting: Family Medicine

## 2022-07-21 DIAGNOSIS — J209 Acute bronchitis, unspecified: Secondary | ICD-10-CM | POA: Diagnosis not present

## 2022-07-21 DIAGNOSIS — J019 Acute sinusitis, unspecified: Secondary | ICD-10-CM | POA: Diagnosis not present

## 2022-07-21 DIAGNOSIS — R059 Cough, unspecified: Secondary | ICD-10-CM | POA: Diagnosis not present

## 2022-08-04 ENCOUNTER — Encounter: Payer: Self-pay | Admitting: Family

## 2022-08-04 ENCOUNTER — Ambulatory Visit (INDEPENDENT_AMBULATORY_CARE_PROVIDER_SITE_OTHER): Payer: PPO | Admitting: Family

## 2022-08-04 VITALS — BP 137/64 | HR 66 | Temp 98.4°F | Resp 16 | Ht 61.0 in | Wt 218.0 lb

## 2022-08-04 DIAGNOSIS — I480 Paroxysmal atrial fibrillation: Secondary | ICD-10-CM

## 2022-08-04 DIAGNOSIS — Z0000 Encounter for general adult medical examination without abnormal findings: Secondary | ICD-10-CM

## 2022-08-04 DIAGNOSIS — M8000XD Age-related osteoporosis with current pathological fracture, unspecified site, subsequent encounter for fracture with routine healing: Secondary | ICD-10-CM

## 2022-08-04 DIAGNOSIS — E039 Hypothyroidism, unspecified: Secondary | ICD-10-CM | POA: Diagnosis not present

## 2022-08-04 DIAGNOSIS — I10 Essential (primary) hypertension: Secondary | ICD-10-CM

## 2022-08-04 DIAGNOSIS — R739 Hyperglycemia, unspecified: Secondary | ICD-10-CM | POA: Diagnosis not present

## 2022-08-04 DIAGNOSIS — E785 Hyperlipidemia, unspecified: Secondary | ICD-10-CM | POA: Diagnosis not present

## 2022-08-04 NOTE — Assessment & Plan Note (Signed)
Maintained on Eliquis. Rate stable. Continues to follow with cardiology.

## 2022-08-04 NOTE — Assessment & Plan Note (Signed)
BP Readings from Last 3 Encounters:  08/04/22 137/64  03/16/22 134/74  02/20/22 126/82   Maintained on metoprolol.  bp at goal.

## 2022-08-04 NOTE — Assessment & Plan Note (Signed)
Continues fosamax, bone density up to date.

## 2022-08-04 NOTE — Assessment & Plan Note (Signed)
Lab Results  Component Value Date   HGBA1C 6.2 11/01/2021   HGBA1C 6.4 06/14/2021   HGBA1C 6.4 11/29/2020   Lab Results  Component Value Date   LDLCALC 95 03/16/2022   CREATININE 1.15 03/16/2022   A1C has been acceptable.  Unfortunately, the lab was unable to add on an A1C after the patient left.  Will see how glucose looks. If significantly elevated will ask her to return to the lab for A1C.

## 2022-08-04 NOTE — Assessment & Plan Note (Signed)
Clinically stable on synthroid. Continue same and update tsh.

## 2022-08-04 NOTE — Assessment & Plan Note (Signed)
Continue to work on healthy diet/weight loss. Declines flu shot, covid shot or shingles shot.  Mammo and dexa up to date.

## 2022-08-04 NOTE — Progress Notes (Signed)
Subjective:     Patient ID: Wendy Munoz, female    DOB: 1941/04/01, 81 y.o.   MRN: [id number]  Chief Complaint  Patient presents with   Annual Exam         HPI  Patient presents today for complete physical.  Immunizations: declined flu shot, tetanus up to date, completed pneumonia shots,  Diet: diet is fair, could be improved. Likes to eat sugar.  Wt Readings from Last 3 Encounters:  08/04/22 218 lb (98.9 kg)  03/16/22 216 lb 6.4 oz (98.2 kg)  02/20/22 214 lb 9.6 oz (97.3 kg)  Exercise: not regularly. Reports that her bp medications causes her fatigue.   Colonoscopy: aged out Dexa: osteopenia 2/23 Pap Smear: aged out Mammogram: negative 12/08/21 Vision: up to date Dental:  up to date  Reports that she is getting over a cold- had a negative covid test. Was given IM rocephin and promethazine DM prn. Overall feels better, but has a dry cough.   Hypothyroid- on synthroid.  Lab Results  Component Value Date   TSH 4.84 03/16/2022   Hyperlipidemia-  Lab Results  Component Value Date   CHOL 196 03/16/2022   HDL 80.90 03/16/2022   LDLCALC 95 03/16/2022   TRIG 99.0 03/16/2022   CHOLHDL 2 03/16/2022   Osteopenia- on fosamax.   There are no preventive care reminders to display for this patient.   Past Medical History:  Diagnosis Date   Allergic state 06/26/2017   Anemia 05/21/2017   Arthritis    Atrial tachycardia    a. asymptomatic. Noted on tele during 11/2014 admission    CAD (coronary artery disease)    a. 11/2014 inf STEMI s/p DES to RCA; 50-70% ruptured plaque in pLAD- rx medically   GERD (gastroesophageal reflux disease)    HTN (hypertension)    Hyperglycemia    Hyperlipidemia    Hypothyroidism    Insomnia 05/27/2017   Ischemic cardiomyopathy    a. 11/2014  LV gram with inferior hypokinesis. EF 45-50%.   Nocturia 01/08/2017   Obesity 06/26/2017   Osteoporosis    PAF (paroxysmal atrial fibrillation) ([facility])    a. isolated episode of atrial fibrillation  several years ago associated w/ her thyroid dysfunction (prior to ablation).     Prediabetes    Preventative health care 06/26/2017   Vasomotor rhinitis 06/26/2017    Past Surgical History:  Procedure Laterality Date   BREAST SURGERY     breast biospy   CATARACT EXTRACTION Bilateral    COLONOSCOPY     EYE SURGERY Bilateral 2005, 2006   Cataract with lens ImplaNT    INGUINAL HERNIA REPAIR Right    LEFT HEART CATHETERIZATION WITH CORONARY ANGIOGRAM N/A 12/02/2014   Procedure: LEFT HEART CATHETERIZATION WITH CORONARY ANGIOGRAM;  Surgeon: Sinclair Grooms, MD;  Location: [facility] CATH LAB;  Service: Cardiovascular;  Laterality: N/A;   ORIF HUMERUS FRACTURE Right 01/27/2014   Procedure: RIGHT OPEN REDUCTION INTERNAL FIXATION (ORIF) PROXIMAL HUMERUS FRACTURE;  Surgeon: Rozanna Box, MD;  Location: [facility];  Service: Orthopedics;  Laterality: Right;   TOTAL KNEE ARTHROPLASTY Right 2006   TOTAL KNEE ARTHROPLASTY Left 9323   UMBILICAL HERNIA REPAIR     as a baby    Family History  Problem Relation Age of Onset   CAD Mother    Heart disease Mother    Heart failure Mother    Polycythemia Father    Multiple myeloma Father    Cancer Sister  breast cancer   Stroke Sister    Hyperlipidemia Sister    Hypertension Sister    Breast cancer Sister 33   Neuropathy Sister    Heart disease Brother        bradycardia   Hypertension Brother    Atrial fibrillation Daughter    Arthritis Maternal Aunt    Breast cancer Maternal Aunt    Lung cancer Paternal Grandmother    Prostate cancer Paternal Grandfather    Diabetes Paternal Grandfather    Breast cancer Other     Social History   Socioeconomic History   Marital status: Married    Spouse name: Not on file   Number of children: Not on file   Years of education: Not on file   Highest education level: Not on file  Occupational History   Occupation: retired   Tobacco Use   Smoking status: Never   Smokeless tobacco: Never  Vaping Use    Vaping Use: Never used  Substance and Sexual Activity   Alcohol use: No    Alcohol/week: 0.0 standard drinks of alcohol   Drug use: No   Sexual activity: Yes    Partners: Male  Other Topics Concern   Not on file  Social History Narrative   Lives with husband   No pets   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    One daughter   Social Determinants of Health   Financial Resource Strain: Between  (08/30/2021)   Overall Financial Resource Strain (CARDIA)    Difficulty of Paying Living Expenses: Not hard at all  Food Insecurity: No Food Insecurity (08/30/2021)   Hunger Vital Sign    Worried About Running Out of Food in the Last Year: Never true    [place] in the Last Year: Never true  Transportation Needs: No Transportation Needs (08/30/2021)   PRAPARE - Hydrologist (Medical): No    Lack of Transportation (Non-Medical): No  Physical Activity: Sufficiently Active (08/30/2021)   Exercise Vital Sign    Days of Exercise per Week: 4 days    Minutes of Exercise per Session: 60 min  Stress: No Stress Concern Present (08/30/2021)   [place]    Feeling of Stress : Not at all  Social Connections: [place] (08/30/2021)   Social Connection and Isolation Panel [NHANES]    Frequency of Communication with Friends and Family: More than three times a week    Frequency of Social Gatherings with Friends and Family: More than three times a week    Attends Religious Services: More than 4 times per year    Active Member of [employer] or Organizations: Yes    Attends Music therapist: More than 4 times per year    Marital Status: Married  Human resources officer Violence: Not At Risk (08/30/2021)   Humiliation, Afraid, Rape, and Kick questionnaire    Fear of Current or Ex-Partner: No    Emotionally Abused: No    Physically Abused: No    Sexually Abused: No    Outpatient  Medications Prior to Visit  Medication Sig Dispense Refill   acyclovir ointment (ZOVIRAX) 5 % Apply small amount to affected 5 x daily x 4 days and as needed cold sore 30 g 1   alendronate (FOSAMAX) 70 MG tablet TAKE 1 TABLET BY MOUTH EVERY 7 DAYS. 12 tablet 3   apixaban (ELIQUIS) 5 MG TABS tablet Take 1  tablet (5 mg total) by mouth 2 (two) times daily. 180 tablet 1   Calcium-Magnesium-Vitamin D (CALCIUM MAGNESIUM PO) Take 1 capsule by mouth daily.     cetirizine (ZYRTEC) 10 MG tablet Take 10 mg by mouth at bedtime.     Cholecalciferol (VITAMIN D) 2000 UNITS tablet Take 6,000 Units by mouth daily.     Coenzyme Q10 200 MG TABS Take 200 mg by mouth daily.     levothyroxine (SYNTHROID) 88 MCG tablet TAKE 1 TABLET BY MOUTH EVERY DAY BEFORE BREAKFAST SKIP THE SATURDAY DOSE 90 tablet 1   metoprolol tartrate (LOPRESSOR) 25 MG tablet TAKE 1 TABLET BY MOUTH TWICE A DAY 180 tablet 3   metroNIDAZOLE (METROGEL) 1 % gel Apply topically daily. 45 g 2   nitroGLYCERIN (NITROSTAT) 0.4 MG SL tablet Place 1 tablet (0.4 mg total) under the tongue every 5 (five) minutes as needed for chest pain. 25 tablet 3   nystatin cream (MYCOSTATIN) Apply 1 application topically 2 (two) times daily as needed for dry skin (rash on abdomen). 30 g 2   rosuvastatin (CRESTOR) 40 MG tablet TAKE 1/2 TABLET BY MOUTH EVERY DAY 45 tablet 1   Tdap (BOOSTRIX) 5-2.5-18.5 LF-MCG/0.5 injection Inject into the muscle. 0.5 mL 0   No facility-administered medications prior to visit.    Allergies  Allergen Reactions   Keflex [Cephalexin] Rash    Review of Systems  HENT:  Positive for hearing loss (mild loss does not need hearing aids). Negative for congestion.   Eyes:  Negative for blurred vision.  Respiratory:  Positive for cough.   Cardiovascular:  Negative for chest pain.  Gastrointestinal:  Negative for constipation and diarrhea.       Occasional hemorrhoid flare  Genitourinary:  Negative for dysuria and frequency.   Musculoskeletal:  Positive for joint pain (bilateral knee replacement.s  has some arthritis in her wrists as well).  Skin:  Negative for rash.  Neurological:  Negative for headaches.  Psychiatric/Behavioral:         Denies depression/anxiety       Objective:    Physical Exam  BP 137/64 (BP Location: Left Arm, Patient Position: Sitting, Cuff Size: Large)   Pulse 66   Temp 98.4 F (36.9 C) (Oral)   Resp 16   Ht _0  (1.549 m)   Wt 218 lb (98.9 kg)   SpO2 98%   BMI 41.19 kg/m  Wt Readings from Last 3 Encounters:  08/04/22 218 lb (98.9 kg)  03/16/22 216 lb 6.4 oz (98.2 kg)  02/20/22 214 lb 9.6 oz (97.3 kg)   Physical Exam  Constitutional: She is oriented to person, place, and time. She appears well-developed and well-nourished. No distress.  HENT:  Head: Normocephalic and atraumatic.  Right Ear: Tympanic membrane and ear canal normal.  Left Ear: Tympanic membrane and ear canal normal.  Mouth/Throat: Not examined- pt wearing mask.  Eyes: Pupils are equal, round, and reactive to light. No scleral icterus.  Neck: Normal range of motion. No thyromegaly present.  Cardiovascular: Normal rate and regular rhythm.   No murmur heard. Pulmonary/Chest: Effort normal and breath sounds normal. No respiratory distress. He has no wheezes. She has no rales. She exhibits no tenderness.  Abdominal: Soft. Bowel sounds are normal. She exhibits no distension and no mass. There is no tenderness. There is no rebound and no guarding.  Musculoskeletal: She exhibits no edema.  Lymphadenopathy:    She has no cervical adenopathy.  Neurological: She is alert and oriented to person,  place, and time. She has normal patellar reflexes. She exhibits normal muscle tone. Coordination normal.  Skin: Skin is warm and dry.  Psychiatric: She has a normal mood and affect. Her behavior is normal. Judgment and thought content normal.  Breast/pelvic: deferred           Assessment & Plan:       Assessment  & Plan:   Problem List Items Addressed This Visit       Unprioritized   Preventative health care - Primary    Continue to work on healthy diet/weight loss. Declines flu shot, covid shot or shingles shot.  Mammo and dexa up to date.       PAF (paroxysmal atrial fibrillation) ([facility])    Maintained on Eliquis. Rate stable. Continues to follow with cardiology.       Osteoporosis    Continues fosamax, bone density up to date.       Hypothyroidism    Clinically stable on synthroid. Continue same and update tsh.       Relevant Orders   TSH   Hyperlipidemia   Relevant Orders   Lipid panel   Comp Met (CMET)   Hyperglycemia    Lab Results  Component Value Date   HGBA1C 6.2 11/01/2021   HGBA1C 6.4 06/14/2021   HGBA1C 6.4 11/29/2020   Lab Results  Component Value Date   LDLCALC 95 03/16/2022   CREATININE 1.15 03/16/2022  A1C has been acceptable.  Unfortunately, the lab was unable to add on an A1C after the patient left.  Will see how glucose looks. If significantly elevated will ask her to return to the lab for A1C.       HTN (hypertension)    BP Readings from Last 3 Encounters:  08/04/22 137/64  03/16/22 134/74  02/20/22 126/82  Maintained on metoprolol.  bp at goal.        I have discontinued Astou Lada. [place]'s Boostrix. I am also having her maintain her cetirizine, Vitamin D, Calcium-Magnesium-Vitamin D (CALCIUM MAGNESIUM PO), Coenzyme Q10, acyclovir ointment, nystatin cream, metroNIDAZOLE, apixaban, metoprolol tartrate, nitroGLYCERIN, levothyroxine, rosuvastatin, and alendronate.  No orders of the defined types were placed in this encounter.

## 2022-08-05 LAB — COMPREHENSIVE METABOLIC PANEL
AG Ratio: 1.4 (calc) (ref 1.0–2.5)
ALT: 16 U/L (ref 6–29)
AST: 25 U/L (ref 10–35)
Albumin: 4 g/dL (ref 3.6–5.1)
Alkaline phosphatase (APISO): 59 U/L (ref 37–153)
BUN/Creatinine Ratio: 17 (calc) (ref 6–22)
BUN: 18 mg/dL (ref 7–25)
CO2: 22 mmol/L (ref 20–32)
Calcium: 8.8 mg/dL (ref 8.6–10.4)
Chloride: 105 mmol/L (ref 98–110)
Creat: 1.09 mg/dL — ABNORMAL HIGH (ref 0.60–0.95)
Globulin: 2.8 g/dL (calc) (ref 1.9–3.7)
Glucose, Bld: 109 mg/dL — ABNORMAL HIGH (ref 65–99)
Potassium: 4.4 mmol/L (ref 3.5–5.3)
Sodium: 139 mmol/L (ref 135–146)
Total Bilirubin: 0.7 mg/dL (ref 0.2–1.2)
Total Protein: 6.8 g/dL (ref 6.1–8.1)

## 2022-08-05 LAB — LIPID PANEL
Cholesterol: 180 mg/dL (ref <200)
HDL: 73 mg/dL (ref >=50)
LDL Cholesterol (Calc): 85 mg/dL (calc)
Non-HDL Cholesterol (Calc): 107 mg/dL (calc) (ref <130)
Total CHOL/HDL Ratio: 2.5 (calc) (ref <5.0)
Triglycerides: 127 mg/dL (ref <150)

## 2022-08-05 LAB — TSH: TSH: 3.63 mIU/L (ref 0.40–4.50)

## 2022-09-01 ENCOUNTER — Ambulatory Visit (INDEPENDENT_AMBULATORY_CARE_PROVIDER_SITE_OTHER): Payer: PPO | Admitting: *Deleted

## 2022-09-01 DIAGNOSIS — Z0000 Encounter for general adult medical examination without abnormal findings: Secondary | ICD-10-CM

## 2022-09-01 NOTE — Progress Notes (Addendum)
Subjective:   Wendy Munoz is a 81 y.o. female who presents for Medicare Annual (Subsequent) preventive examination.  I connected with  Manisha Cancel Perea on 09/01/22 by a audio enabled telemedicine application and verified that I am speaking with the correct person using two identifiers.  Patient Location: Home  Provider Location: Office/Clinic  I discussed the limitations of evaluation and management by telemedicine. The patient expressed understanding and agreed to proceed.   Review of Systems    Defer to PCP Cardiac Risk Factors include: advanced age (>37mn, >>34women);dyslipidemia;hypertension;obesity (BMI >30kg/m2)     Objective:    There were no vitals filed for this visit. There is no height or weight on file to calculate BMI.     09/01/2022    1:43 PM 08/30/2021    1:48 PM 07/04/2015    8:57 PM 04/15/2015   10:53 AM 02/03/2015   12:25 PM 12/02/2014    5:41 PM 01/26/2014    4:34 PM  Advanced Directives  Does Patient Have a Medical Advance Directive? _0  No Patient does not have advance directive  Would patient like information on creating a medical advance directive? No - Patient declined No - Patient declined No - patient declined information No - patient declined information No - patient declined information No - patient declined information     Current Medications (verified) Outpatient Encounter Medications as of 09/01/2022  Medication Sig   acyclovir ointment (ZOVIRAX) 5 % Apply small amount to affected 5 x daily x 4 days and as needed cold sore   alendronate (FOSAMAX) 70 MG tablet TAKE 1 TABLET BY MOUTH EVERY 7 DAYS.   apixaban (ELIQUIS) 5 MG TABS tablet Take 1 tablet (5 mg total) by mouth 2 (two) times daily.   Calcium-Magnesium-Vitamin D (CALCIUM MAGNESIUM PO) Take 1 capsule by mouth daily.   cetirizine (ZYRTEC) 10 MG tablet Take 10 mg by mouth at bedtime.   Cholecalciferol (VITAMIN D) 2000 UNITS tablet Take 6,000 Units by mouth daily.   Coenzyme Q10  200 MG TABS Take 200 mg by mouth daily.   levothyroxine (SYNTHROID) 88 MCG tablet TAKE 1 TABLET BY MOUTH EVERY DAY BEFORE BREAKFAST SKIP THE SATURDAY DOSE   metoprolol tartrate (LOPRESSOR) 25 MG tablet TAKE 1 TABLET BY MOUTH TWICE A DAY   metroNIDAZOLE (METROGEL) 1 % gel Apply topically daily.   nitroGLYCERIN (NITROSTAT) 0.4 MG SL tablet Place 1 tablet (0.4 mg total) under the tongue every 5 (five) minutes as needed for chest pain.   nystatin cream (MYCOSTATIN) Apply 1 application topically 2 (two) times daily as needed for dry skin (rash on abdomen).   rosuvastatin (CRESTOR) 40 MG tablet TAKE 1/2 TABLET BY MOUTH EVERY DAY   No facility-administered encounter medications on file as of 09/01/2022.    Allergies (verified) Keflex [cephalexin]   History: Past Medical History:  Diagnosis Date   Allergic state 06/26/2017   Anemia 05/21/2017   Arthritis    Atrial tachycardia    a. asymptomatic. Noted on tele during 11/2014 admission    CAD (coronary artery disease)    a. 11/2014 inf STEMI s/p DES to RCA; 50-70% ruptured plaque in pLAD- rx medically   GERD (gastroesophageal reflux disease)    HTN (hypertension)    Hyperglycemia    Hyperlipidemia    Hypothyroidism    Insomnia 05/27/2017   Ischemic cardiomyopathy    a. 11/2014  LV gram with inferior hypokinesis. EF 45-50%.   Nocturia 01/08/2017   Obesity 06/26/2017  Osteoporosis    PAF (paroxysmal atrial fibrillation) (HCC)    a. isolated episode of atrial fibrillation several years ago associated w/ her thyroid dysfunction (prior to ablation).     Prediabetes    Preventative health care 06/26/2017   Vasomotor rhinitis 06/26/2017   Past Surgical History:  Procedure Laterality Date   BREAST SURGERY     breast biospy   CATARACT EXTRACTION Bilateral    COLONOSCOPY     EYE SURGERY Bilateral 2005, 2006   Cataract with lens ImplaNT    INGUINAL HERNIA REPAIR Right    LEFT HEART CATHETERIZATION WITH CORONARY ANGIOGRAM N/A 12/02/2014   Procedure:  LEFT HEART CATHETERIZATION WITH CORONARY ANGIOGRAM;  Surgeon: Sinclair Grooms, MD;  Location[facility] CATH LAB;  Service: Cardiovascular;  Laterality: N/A;   ORIF HUMERUS FRACTURE Right 01/27/2014   Procedure: RIGHT OPEN REDUCTION INTERNAL FIXATION (ORIF) PROXIMAL HUMERUS FRACTURE;  Surgeon: Rozanna Box, MD;  Location: [place];  Service: Orthopedics;  Laterality: Right;   TOTAL KNEE ARTHROPLASTY Right 2006   TOTAL KNEE ARTHROPLASTY Left 7619   UMBILICAL HERNIA REPAIR     as a baby   Family History  Problem Relation Age of Onset   CAD Mother    Heart disease Mother    Heart failure Mother    Polycythemia Father    Multiple myeloma Father    Cancer Sister        breast cancer   Stroke Sister    Hyperlipidemia Sister    Hypertension Sister    Breast cancer Sister 46   Neuropathy Sister    Heart disease Brother        bradycardia   Hypertension Brother    Atrial fibrillation Daughter    Arthritis Maternal Aunt    Breast cancer Maternal Aunt    Lung cancer Paternal Grandmother    Prostate cancer Paternal Grandfather    Diabetes Paternal Grandfather    Breast cancer Other    Social History   Socioeconomic History   Marital status: Married    Spouse name: Not on file   Number of children: Not on file   Years of education: Not on file   Highest education level: Not on file  Occupational History   Occupation: retired   Tobacco Use   Smoking status: Never   Smokeless tobacco: Never  Vaping Use   Vaping Use: Never used  Substance and Sexual Activity   Alcohol use: No    Alcohol/week: 0.0 standard drinks of alcohol   Drug use: No   Sexual activity: Yes    Partners: Male  Other Topics Concern   Not on file  Social History Narrative   Lives with husband   No pets   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    One daughter   Social Determinants of Health   Financial Resource Strain: [place]  (08/30/2021)   Overall Financial Resource Strain (CARDIA)     Difficulty of Paying Living Expenses: Not hard at all  Food Insecurity: No Food Insecurity (09/01/2022)   Hunger Vital Sign    Worried About Running Out of Food in the Last Year: Never true    Ran Out of Food in the Last Year: Never true  Transportation Needs: No Transportation Needs (09/01/2022)   PRAPARE - Hydrologist (Medical): No    Lack of Transportation (Non-Medical): No  Physical Activity: Sufficiently Active (08/30/2021)   Exercise Vital Sign    Days  of Exercise per Week: 4 days    Minutes of Exercise per Session: 60 min  Stress: No Stress Concern Present (08/30/2021)   [place]    Feeling of Stress : Not at all  Social Connections: [place] (08/30/2021)   Social Connection and Isolation Panel [NHANES]    Frequency of Communication with Friends and Family: More than three times a week    Frequency of Social Gatherings with Friends and Family: More than three times a week    Attends Religious Services: More than 4 times per year    Active Member of [employer] or Organizations: Yes    Attends Music therapist: More than 4 times per year    Marital Status: Married    Tobacco Counseling Counseling given: Not Answered   Clinical Intake:  Pre-visit preparation completed: Yes  Pain : No/denies pain  Diabetes: No  How often do you need to have someone help you when you read instructions, pamphlets, or other written materials from your doctor or pharmacy?: 1 - Never   Activities of Daily Living    09/01/2022    1:44 PM  In your present state of health, do you have any difficulty performing the following activities:  Hearing? 0  Vision? 0  Difficulty concentrating or making decisions? 1  Comment slight short term memory loss  Walking or climbing stairs? 1  Comment bilateral knee replacements in the past  Dressing or bathing? 0  Doing errands, shopping? 0   Preparing Food and eating ? N  Using the Toilet? N  In the past six months, have you accidently leaked urine? Y  Do you have problems with loss of bowel control? N  Managing your Medications? N  Managing your Finances? N  Housekeeping or managing your Housekeeping? N    Patient Care Team: Mosie Lukes, MD as PCP - General (Family Medicine) Belva Crome, MD as PCP - Cardiology (Cardiology) Vickey Huger, MD as Consulting Physician (Orthopedic Surgery) Altamese Gunbarrel, MD as Consulting Physician (Orthopedic Surgery)  Indicate any recent Medical Services you may have received from other than [facility] providers in the past year (date may be approximate).     Assessment:   This is a routine wellness examination for [place].  Hearing/Vision screen No results found.  Dietary issues and exercise activities discussed: Current Exercise Habits: The patient does not participate in regular exercise at present, Exercise limited by: orthopedic condition(s)   Goals Addressed   None    Depression Screen    09/01/2022    1:44 PM 03/16/2022    3:18 PM 08/30/2021    1:52 PM 06/14/2021    1:28 PM 06/03/2020    1:22 PM 01/08/2017    2:07 PM  PHQ 2/9 Scores  PHQ - 2 Score 0 0 0 0 0 0    Fall Risk    09/01/2022    1:43 PM 03/16/2022    3:18 PM 08/30/2021    1:50 PM 06/14/2021    1:28 PM 11/29/2020    1:52 PM  [place] in the past year? 0 0 0 0 0  Number falls in past yr: 0 0 0 0 0  Injury with Fall? 0 0 0 0 0  Risk for fall due to : No Fall Risks No Fall Risks  No Fall Risks   Follow up Falls evaluation completed Falls evaluation completed Falls prevention discussed Falls evaluation completed  FALL RISK PREVENTION PERTAINING TO THE HOME:  Any stairs in or around the home? No  If so, are there any without handrails?  No stairs Home free of loose throw rugs in walkways, pet beds, electrical cords, etc? Yes  Adequate lighting in your home to reduce risk of falls? Yes    ASSISTIVE DEVICES UTILIZED TO PREVENT FALLS:  Life alert? No  Use of a cane, walker or w/c? No  Grab bars in the bathroom? No  Shower chair or bench in shower? Yes  Elevated toilet seat or a handicapped toilet? Yes   TIMED UP AND GO:  Was the test performed?  No, audio visit .    Cognitive Function:        09/01/2022    1:51 PM  6CIT Screen  What Year? 0 points  What month? 0 points  What time? 0 points  Count back from 20 0 points  Months in reverse 0 points  Repeat phrase 0 points  Total Score 0 points    Immunizations Immunization History  Administered Date(s) Administered   Fluad Quad(high Dose 65+) 07/19/2019, 08/03/2020   Influenza, High Dose Seasonal PF 06/26/2017, 07/18/2018, 08/14/2021   Influenza-Unspecified 08/14/2021   Pneumococcal Conjugate-13 05/23/2014   Pneumococcal Polysaccharide-23 05/20/2019   Td 10/23/2009   Tdap 11/11/2021    TDAP status: Up to date  Flu Vaccine status: Due, Education has been provided regarding the importance of this vaccine. Advised may receive this vaccine at local pharmacy or Health Dept. Aware to provide a copy of the vaccination record if obtained from local pharmacy or Health Dept. Verbalized acceptance and understanding.  Pneumococcal vaccine status: Up to date  Covid-19 vaccine status: Information provided on how to obtain vaccines.   Qualifies for Shingles Vaccine? Yes   Zostavax completed No   Shingrix Completed?: No.    Education has been provided regarding the importance of this vaccine. Patient has been advised to call insurance company to determine out of pocket expense if they have not yet received this vaccine. Advised may also receive vaccine at local pharmacy or Health Dept. Verbalized acceptance and understanding.  Screening Tests Health Maintenance  Topic Date Due   Medicare Annual Wellness (AWV)  08/30/2022   Zoster Vaccines- Shingrix (1 of 2) 11/04/2022 (Originally 10/10/1991)   INFLUENZA  VACCINE  01/21/2023 (Originally 05/23/2022)   TETANUS/TDAP  11/12/2031   Pneumonia Vaccine 73+ Years old  Completed   DEXA SCAN  Completed   HPV VACCINES  Aged Out   COVID-19 Vaccine  Discontinued    Health Maintenance  Health Maintenance Due  Topic Date Due   Medicare Annual Wellness (AWV)  08/30/2022    Colorectal cancer screening: No longer required.   Mammogram status: Completed 12/07/21. Repeat every year  Bone Density status: Completed 12/07/20. Results reflect: Bone density results: OSTEOPOROSIS. Repeat every 2 years.  Lung Cancer Screening: (Low Dose CT Chest recommended if Age 3-80 years, 30 pack-year currently smoking OR have quit w/in 15years.) does not qualify.    Additional Screening:  Hepatitis C Screening: does not qualify  Vision Screening: Recommended annual ophthalmology exams for early detection of glaucoma and other disorders of the eye. Is the patient up to date with their annual eye exam?  Yes  Who is the provider or what is the name of the office in which the patient attends annual eye exams? Dr. [facility] If pt is not established with a provider, would they like to be referred to a provider to establish  care? No .   Dental Screening: Recommended annual dental exams for proper oral hygiene  Community Resource Referral / Chronic Care Management: CRR required this visit?  No   CCM required this visit?  No      Plan:     I have personally reviewed and noted the following in the patient's chart:   Medical and social history Use of alcohol, tobacco or illicit drugs  Current medications and supplements including opioid prescriptions. Patient is not currently taking opioid prescriptions. Functional ability and status Nutritional status Physical activity Advanced directives List of other physicians Hospitalizations, surgeries, and ER visits in previous 12 months Vitals Screenings to include cognitive, depression, and  falls Referrals and appointments  In addition, I have reviewed and discussed with patient certain preventive protocols, quality metrics, and best practice recommendations. A written personalized care plan for preventive services as well as general preventive health recommendations were provided to patient.   Due to this being a telephonic visit, the after visit summary with patients personalized plan was offered to patient via mail or my-chart.  Patient would like to access on my-chart.  Beatris Ship, [place]   09/01/2022   Nurse Notes: None  I have reviewed and agree with [facility] documentation.  Kathlene November, MD

## 2022-09-01 NOTE — Patient Instructions (Signed)
Ms. Wendy Munoz , Thank you for taking time to come for your Medicare Wellness Visit. I appreciate your ongoing commitment to your health goals. Please review the following plan we discussed and let me know if I can assist you in the future.   These are the goals we discussed:  Goals      Patient Stated     Continue doing exercises learned at physical therapy        This is a list of the screening recommended for you and due dates:  Health Maintenance  Topic Date Due   Zoster (Shingles) Vaccine (1 of 2) 11/04/2022*   Flu Shot  01/21/2023*   Medicare Annual Wellness Visit  09/02/2023   Tetanus Vaccine  11/12/2031   Pneumonia Vaccine  Completed   DEXA scan (bone density measurement)  Completed   HPV Vaccine  Aged Out   COVID-19 Vaccine  Discontinued  *Topic was postponed. The date shown is not the original due date.     Next appointment: Follow up in one year for your annual wellness visit.   Preventive Care 61 Years and Older, Female Preventive care refers to lifestyle choices and visits with your health care provider that can promote health and wellness. What does preventive care include? A yearly physical exam. This is also called an annual well check. Dental exams once or twice a year. Routine eye exams. Ask your health care provider how often you should have your eyes checked. Personal lifestyle choices, including: Daily care of your teeth and gums. Regular physical activity. Eating a healthy diet. Avoiding tobacco and drug use. Limiting alcohol use. Practicing safe sex. Taking low-dose aspirin every day. Taking vitamin and mineral supplements as recommended by your health care provider. What happens during an annual well check? The services and screenings done by your health care provider during your annual well check will depend on your age, overall health, lifestyle risk factors, and family history of disease. Counseling  Your health care provider may ask you  questions about your: Alcohol use. Tobacco use. Drug use. Emotional well-being. Home and relationship well-being. Sexual activity. Eating habits. History of falls. Memory and ability to understand (cognition). Work and work Statistician. Reproductive health. Screening  You may have the following tests or measurements: Height, weight, and BMI. Blood pressure. Lipid and cholesterol levels. These may be checked every 5 years, or more frequently if you are over 77 years old. Skin check. Lung cancer screening. You may have this screening every year starting at age 54 if you have a 30-pack-year history of smoking and currently smoke or have quit within the past 15 years. Fecal occult blood test (FOBT) of the stool. You may have this test every year starting at age 27. Flexible sigmoidoscopy or colonoscopy. You may have a sigmoidoscopy every 5 years or a colonoscopy every 10 years starting at age 1. Hepatitis C blood test. Hepatitis B blood test. Sexually transmitted disease (STD) testing. Diabetes screening. This is done by checking your blood sugar (glucose) after you have not eaten for a while (fasting). You may have this done every 1-3 years. Bone density scan. This is done to screen for osteoporosis. You may have this done starting at age 25. Mammogram. This may be done every 1-2 years. Talk to your health care provider about how often you should have regular mammograms. Talk with your health care provider about your test results, treatment options, and if necessary, the need for more tests. Vaccines  Your health care provider  may recommend certain vaccines, such as: Influenza vaccine. This is recommended every year. Tetanus, diphtheria, and acellular pertussis (Tdap, Td) vaccine. You may need a Td booster every 10 years. Zoster vaccine. You may need this after age 36. Pneumococcal 13-valent conjugate (PCV13) vaccine. One dose is recommended after age 64. Pneumococcal polysaccharide  (PPSV23) vaccine. One dose is recommended after age 72. Talk to your health care provider about which screenings and vaccines you need and how often you need them. This information is not intended to replace advice given to you by your health care provider. Make sure you discuss any questions you have with your health care provider. Document Released: 11/05/2015 Document Revised: 06/28/2016 Document Reviewed: 08/10/2015 Elsevier Interactive Patient Education  2017 [place] Prevention in the Home Falls can cause injuries. They can happen to people of all ages. There are many things you can do to make your home safe and to help prevent falls. What can I do on the outside of my home? Regularly fix the edges of walkways and driveways and fix any cracks. Remove anything that might make you trip as you walk through a door, such as a raised step or threshold. Trim any bushes or trees on the path to your home. Use bright outdoor lighting. Clear any walking paths of anything that might make someone trip, such as rocks or tools. Regularly check to see if handrails are loose or broken. Make sure that both sides of any steps have handrails. Any raised decks and porches should have guardrails on the edges. Have any leaves, snow, or ice cleared regularly. Use sand or salt on walking paths during winter. Clean up any spills in your garage right away. This includes oil or grease spills. What can I do in the bathroom? Use night lights. Install grab bars by the toilet and in the tub and shower. Do not use towel bars as grab bars. Use non-skid mats or decals in the tub or shower. If you need to sit down in the shower, use a plastic, non-slip stool. Keep the floor dry. Clean up any water that spills on the floor as soon as it happens. Remove soap buildup in the tub or shower regularly. Attach bath mats securely with double-sided non-slip rug tape. Do not have throw rugs and other things on the  floor that can make you trip. What can I do in the bedroom? Use night lights. Make sure that you have a light by your bed that is easy to reach. Do not use any sheets or blankets that are too big for your bed. They should not hang down onto the floor. Have a firm chair that has side arms. You can use this for support while you get dressed. Do not have throw rugs and other things on the floor that can make you trip. What can I do in the kitchen? Clean up any spills right away. Avoid walking on wet floors. Keep items that you use a lot in easy-to-reach places. If you need to reach something above you, use a strong step stool that has a grab bar. Keep electrical cords out of the way. Do not use floor polish or wax that makes floors slippery. If you must use wax, use non-skid floor wax. Do not have throw rugs and other things on the floor that can make you trip. What can I do with my stairs? Do not leave any items on the stairs. Make sure that there are handrails on both sides  of the stairs and use them. Fix handrails that are broken or loose. Make sure that handrails are as long as the stairways. Check any carpeting to make sure that it is firmly attached to the stairs. Fix any carpet that is loose or worn. Avoid having throw rugs at the top or bottom of the stairs. If you do have throw rugs, attach them to the floor with carpet tape. Make sure that you have a light switch at the top of the stairs and the bottom of the stairs. If you do not have them, ask someone to add them for you. What else can I do to help prevent falls? Wear shoes that: Do not have high heels. Have rubber bottoms. Are comfortable and fit you well. Are closed at the toe. Do not wear sandals. If you use a stepladder: Make sure that it is fully opened. Do not climb a closed stepladder. Make sure that both sides of the stepladder are locked into place. Ask someone to hold it for you, if possible. Clearly mark and make  sure that you can see: Any grab bars or handrails. First and last steps. Where the edge of each step is. Use tools that help you move around (mobility aids) if they are needed. These include: Canes. Walkers. Scooters. Crutches. Turn on the lights when you go into a dark area. Replace any light bulbs as soon as they burn out. Set up your furniture so you have a clear path. Avoid moving your furniture around. If any of your floors are uneven, fix them. If there are any pets around you, be aware of where they are. Review your medicines with your doctor. Some medicines can make you feel dizzy. This can increase your chance of falling. Ask your doctor what other things that you can do to help prevent falls. This information is not intended to replace advice given to you by your health care provider. Make sure you discuss any questions you have with your health care provider. Document Released: 08/05/2009 Document Revised: 03/16/2016 Document Reviewed: 11/13/2014 Elsevier Interactive Patient Education  2017 [employer].

## 2022-09-03 ENCOUNTER — Other Ambulatory Visit: Payer: Self-pay | Admitting: Family Medicine

## 2022-09-16 ENCOUNTER — Other Ambulatory Visit: Payer: Self-pay | Admitting: Family Medicine

## 2022-10-09 ENCOUNTER — Other Ambulatory Visit: Payer: Self-pay | Admitting: Interventional Cardiology

## 2022-10-09 DIAGNOSIS — I10 Essential (primary) hypertension: Secondary | ICD-10-CM

## 2022-10-09 DIAGNOSIS — I2510 Atherosclerotic heart disease of native coronary artery without angina pectoris: Secondary | ICD-10-CM

## 2022-10-09 DIAGNOSIS — I480 Paroxysmal atrial fibrillation: Secondary | ICD-10-CM

## 2022-10-09 NOTE — Telephone Encounter (Signed)
Prescription refill request for Eliquis received. Indication: Afib  Last office visit: 02/20/22 Tamala Julian)  Scr: 1.09 (08/04/22)  Age: 81 Weight: 98.9kg  Appropriate dose and refill sent to requested pharmacy.

## 2022-10-26 DIAGNOSIS — J111 Influenza due to unidentified influenza virus with other respiratory manifestations: Secondary | ICD-10-CM | POA: Diagnosis not present

## 2022-10-26 DIAGNOSIS — R0981 Nasal congestion: Secondary | ICD-10-CM | POA: Diagnosis not present

## 2022-10-26 DIAGNOSIS — R051 Acute cough: Secondary | ICD-10-CM | POA: Diagnosis not present

## 2022-10-26 DIAGNOSIS — R509 Fever, unspecified: Secondary | ICD-10-CM | POA: Diagnosis not present

## 2022-10-26 DIAGNOSIS — J019 Acute sinusitis, unspecified: Secondary | ICD-10-CM | POA: Diagnosis not present

## 2022-11-22 ENCOUNTER — Encounter: Payer: Self-pay | Admitting: Family

## 2022-11-22 ENCOUNTER — Ambulatory Visit (INDEPENDENT_AMBULATORY_CARE_PROVIDER_SITE_OTHER): Payer: PPO | Admitting: Family

## 2022-11-22 VITALS — BP 122/51 | HR 64 | Temp 98.0°F | Resp 16 | Wt 219.0 lb

## 2022-11-22 DIAGNOSIS — R739 Hyperglycemia, unspecified: Secondary | ICD-10-CM

## 2022-11-22 DIAGNOSIS — M8000XD Age-related osteoporosis with current pathological fracture, unspecified site, subsequent encounter for fracture with routine healing: Secondary | ICD-10-CM

## 2022-11-22 DIAGNOSIS — E785 Hyperlipidemia, unspecified: Secondary | ICD-10-CM

## 2022-11-22 DIAGNOSIS — E039 Hypothyroidism, unspecified: Secondary | ICD-10-CM | POA: Diagnosis not present

## 2022-11-22 DIAGNOSIS — I2510 Atherosclerotic heart disease of native coronary artery without angina pectoris: Secondary | ICD-10-CM | POA: Diagnosis not present

## 2022-11-22 DIAGNOSIS — I10 Essential (primary) hypertension: Secondary | ICD-10-CM | POA: Diagnosis not present

## 2022-11-22 DIAGNOSIS — I480 Paroxysmal atrial fibrillation: Secondary | ICD-10-CM

## 2022-11-22 LAB — BASIC METABOLIC PANEL
BUN: 15 mg/dL (ref 6–23)
CO2: 27 mEq/L (ref 19–32)
Calcium: 9.2 mg/dL (ref 8.4–10.5)
Chloride: 103 mEq/L (ref 96–112)
Creatinine, Ser: 1.13 mg/dL (ref 0.40–1.20)
GFR: 45.75 mL/min — ABNORMAL LOW (ref >60.00)
Glucose, Bld: 95 mg/dL (ref 70–99)
Potassium: 4.1 mEq/L (ref 3.5–5.1)
Sodium: 138 mEq/L (ref 135–145)

## 2022-11-22 LAB — HEMOGLOBIN A1C: Hgb A1c MFr Bld: 6.3 % (ref 4.6–6.5)

## 2022-11-22 NOTE — Assessment & Plan Note (Signed)
Lab Results  Component Value Date   TSH 3.63 08/04/2022   Last TSH stable. Continue synthroid.

## 2022-11-22 NOTE — Assessment & Plan Note (Signed)
BP Readings from Last 3 Encounters:  11/22/22 (!) 122/51  08/04/22 137/64  03/16/22 134/74   At goal, continue metoprolol.

## 2022-11-22 NOTE — Progress Notes (Signed)
Subjective:   By signing my name below, I, Shehryar Baig, attest that this documentation has been prepared under the direction and in the presence of Debbrah Alar, NP. 11/22/2022   Patient ID: Wendy Munoz, female    DOB: 1941-09-21, 82 y.o.   MRN: [id number]  Chief Complaint  Patient presents with   Hypothyroidism    Here for follow up   Hypertension    Here for follow up    Hypertension   Patient is in today for a office visit.   Flu: She reports contracting flu after going to the hospital for her husband. She seen a urgent care on 10/26/2022 and was diagnosed with type a flu. She has never had the flu in the past. She did not have the flu vaccine this year. She was given tama flu and azithromycin and promethazine to manage her symptoms.  Blood pressure: Her blood pressure is stable during this visit while taking 25 mg metoprolol tartrate 2x daily PO. She continues following up with her cardiologist. She has an [place] appointment with her new cardiologist May 2024. She denies having any chest pain but is having left foot swelling. Her other cardiologist thinks her foot swelling is unrelated to her heart.  BP Readings from Last 3 Encounters:  11/22/22 (!) 122/51  08/04/22 137/64  03/16/22 134/74   Pulse Readings from Last 3 Encounters:  11/22/22 64  08/04/22 66  03/16/22 61   A1c: Her last A1c was borderline and she is interested in checking it during her next blood work.  Lab Results  Component Value Date   HGBA1C 6.2 11/01/2021   Cholesterol: Her last cholesterol levels were stable while taking 20 mg Crestor daily PO.  Lab Results  Component Value Date   CHOL 180 08/04/2022   HDL 73 08/04/2022   LDLCALC 85 08/04/2022   TRIG 127 08/04/2022   CHOLHDL 2.5 08/04/2022   Eliquis: She continues taking Eliquis after she as diagnosed with Atrial fibrillation. She reports her pulse is low and is planning on following up with her cardiologist about this.   Bone  density: She is UTD on bone density. She continues taking 70 mg Fosamax every 7 days.   Thyroid: She continues taking 88 mg Synthroid every day but Saturday.   Immunizations: She has not received the shingles vaccines.    Past Medical History:  Diagnosis Date   Allergic state 06/26/2017   Anemia 05/21/2017   Arthritis    Atrial tachycardia    a. asymptomatic. Noted on tele during 11/2014 admission    CAD (coronary artery disease)    a. 11/2014 inf STEMI s/p DES to RCA; 50-70% ruptured plaque in pLAD- rx medically   GERD (gastroesophageal reflux disease)    HTN (hypertension)    Hyperglycemia    Hyperlipidemia    Hypothyroidism    Insomnia 05/27/2017   Ischemic cardiomyopathy    a. 11/2014  LV gram with inferior hypokinesis. EF 45-50%.   Nocturia 01/08/2017   Obesity 06/26/2017   Osteoporosis    PAF (paroxysmal atrial fibrillation) ([place])    a. isolated episode of atrial fibrillation several years ago associated w/ her thyroid dysfunction (prior to ablation).     Prediabetes    Preventative health care 06/26/2017   Vasomotor rhinitis 06/26/2017    Past Surgical History:  Procedure Laterality Date   BREAST SURGERY     breast biospy   CATARACT EXTRACTION Bilateral    COLONOSCOPY     EYE SURGERY Bilateral  2005, 2006   Cataract with lens ImplaNT    INGUINAL HERNIA REPAIR Right    LEFT HEART CATHETERIZATION WITH CORONARY ANGIOGRAM N/A 12/02/2014   Procedure: LEFT HEART CATHETERIZATION WITH CORONARY ANGIOGRAM;  Surgeon: Sinclair Grooms, MD;  Location: [facility] CATH LAB;  Service: Cardiovascular;  Laterality: N/A;   ORIF HUMERUS FRACTURE Right 01/27/2014   Procedure: RIGHT OPEN REDUCTION INTERNAL FIXATION (ORIF) PROXIMAL HUMERUS FRACTURE;  Surgeon: Rozanna Box, MD;  Location: [place];  Service: Orthopedics;  Laterality: Right;   TOTAL KNEE ARTHROPLASTY Right 2006   TOTAL KNEE ARTHROPLASTY Left 3329   UMBILICAL HERNIA REPAIR     as a baby    Family History  Problem Relation Age of Onset    CAD Mother    Heart disease Mother    Heart failure Mother    Polycythemia Father    Multiple myeloma Father    Cancer Sister        breast cancer   Stroke Sister    Hyperlipidemia Sister    Hypertension Sister    Breast cancer Sister 16   Neuropathy Sister    Heart disease Brother        bradycardia   Hypertension Brother    Atrial fibrillation Daughter    Arthritis Maternal Aunt    Breast cancer Maternal Aunt    Lung cancer Paternal Grandmother    Prostate cancer Paternal Grandfather    Diabetes Paternal Grandfather    Breast cancer Other     Social History   Socioeconomic History   Marital status: Married    Spouse name: Not on file   Number of children: Not on file   Years of education: Not on file   Highest education level: Not on file  Occupational History   Occupation: retired   Tobacco Use   Smoking status: Never   Smokeless tobacco: Never  Vaping Use   Vaping Use: Never used  Substance and Sexual Activity   Alcohol use: No    Alcohol/week: 0.0 standard drinks of alcohol   Drug use: No   Sexual activity: Yes    Partners: Male  Other Topics Concern   Not on file  Social History Narrative   Lives with husband   No pets   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    One daughter   Social Determinants of Health   Financial Resource Strain: [place]  (08/30/2021)   Overall Financial Resource Strain (CARDIA)    Difficulty of Paying Living Expenses: Not hard at all  Food Insecurity: No Food Insecurity (09/01/2022)   Hunger Vital Sign    Worried About Running Out of Food in the Last Year: Never true    Ran Out of Food in the Last Year: Never true  Transportation Needs: No Transportation Needs (09/01/2022)   PRAPARE - Hydrologist (Medical): No    Lack of Transportation (Non-Medical): No  Physical Activity: Sufficiently Active (08/30/2021)   Exercise Vital Sign    Days of Exercise per Week: 4 days    Minutes of  Exercise per Session: 60 min  Stress: No Stress Concern Present (08/30/2021)   [place]    Feeling of Stress : Not at all  Social Connections: [place] (08/30/2021)   Social Connection and Isolation Panel [NHANES]    Frequency of Communication with Friends and Family: More than three times a week    Frequency of  Social Gatherings with Friends and Family: More than three times a week    Attends Religious Services: More than 4 times per year    Active Member of [employer] or Organizations: Yes    Attends Music therapist: More than 4 times per year    Marital Status: Married  Human resources officer Violence: Not At Risk (08/30/2021)   Humiliation, Afraid, Rape, and Kick questionnaire    Fear of Current or Ex-Partner: No    Emotionally Abused: No    Physically Abused: No    Sexually Abused: No    Outpatient Medications Prior to Visit  Medication Sig Dispense Refill   acyclovir ointment (ZOVIRAX) 5 % Apply small amount to affected 5 x daily x 4 days and as needed cold sore 30 g 1   alendronate (FOSAMAX) 70 MG tablet TAKE 1 TABLET BY MOUTH EVERY 7 DAYS. 12 tablet 3   Calcium-Magnesium-Vitamin D (CALCIUM MAGNESIUM PO) Take 1 capsule by mouth daily.     cetirizine (ZYRTEC) 10 MG tablet Take 10 mg by mouth at bedtime.     Cholecalciferol (VITAMIN D) 2000 UNITS tablet Take 6,000 Units by mouth daily.     Coenzyme Q10 200 MG TABS Take 200 mg by mouth daily.     ELIQUIS 5 MG TABS tablet TAKE 1 TABLET BY MOUTH TWICE A DAY 180 tablet 1   levothyroxine (SYNTHROID) 88 MCG tablet TAKE 1 TABLET BY MOUTH EVERY DAY BEFORE BREAKFAST SKIP THE SATURDAY DOSE 90 tablet 1   metoprolol tartrate (LOPRESSOR) 25 MG tablet TAKE 1 TABLET BY MOUTH TWICE A DAY 180 tablet 3   metroNIDAZOLE (METROGEL) 1 % gel Apply topically daily. 45 g 2   nitroGLYCERIN (NITROSTAT) 0.4 MG SL tablet Place 1 tablet (0.4 mg total) under the tongue every 5  (five) minutes as needed for chest pain. 25 tablet 3   nystatin cream (MYCOSTATIN) Apply 1 application topically 2 (two) times daily as needed for dry skin (rash on abdomen). 30 g 2   rosuvastatin (CRESTOR) 40 MG tablet TAKE 1/2 TABLET BY MOUTH EVERY DAY 45 tablet 1   No facility-administered medications prior to visit.    Allergies  Allergen Reactions   Keflex [Cephalexin] Rash    ROS See hpi    Objective:    Physical Exam Constitutional:      General: She is not in acute distress.    Appearance: Normal appearance. She is not ill-appearing.  HENT:     Head: Normocephalic and atraumatic.     Right Ear: External ear normal.     Left Ear: External ear normal.  Eyes:     Extraocular Movements: Extraocular movements intact.     Pupils: Pupils are equal, round, and reactive to light.  Cardiovascular:     Rate and Rhythm: Normal rate and regular rhythm.     Heart sounds: Normal heart sounds. No murmur heard.    No gallop.  Pulmonary:     Effort: Pulmonary effort is normal. No respiratory distress.     Breath sounds: Wheezing (expiratory wheeze in left lower lobe) present. No rales.  Skin:    General: Skin is warm and dry.  Neurological:     Mental Status: She is alert and oriented to person, place, and time.  Psychiatric:        Judgment: Judgment normal.     BP (!) 122/51 (BP Location: Left Arm, Patient Position: Sitting, Cuff Size: Large)   Pulse 64   Temp 98 F (36.7 C) (  Oral)   Resp 16   Wt 219 lb (99.3 kg)   SpO2 99%   BMI 41.38 kg/m  Wt Readings from Last 3 Encounters:  11/22/22 219 lb (99.3 kg)  08/04/22 218 lb (98.9 kg)  03/16/22 216 lb 6.4 oz (98.2 kg)       Assessment & Plan:  Primary hypertension Assessment & Plan: BP Readings from Last 3 Encounters:  11/22/22 (!) 122/51  08/04/22 137/64  03/16/22 134/74   At goal, continue metoprolol.    Orders: -     Basic metabolic panel  Coronary artery disease involving native coronary artery of  native heart without angina pectoris Assessment & Plan: Followed by cardiology.  She is scheduled to establish with Dr. Geraldo Pitter in May as her cardiologist retired.     Hyperglycemia Assessment & Plan: Lab Results  Component Value Date   HGBA1C 6.2 11/01/2021   Will check A1C.   Orders: -     Hemoglobin A1c  Hyperlipidemia, unspecified hyperlipidemia type Assessment & Plan: Lab Results  Component Value Date   CHOL 180 08/04/2022   HDL 73 08/04/2022   LDLCALC 85 08/04/2022   TRIG 127 08/04/2022   CHOLHDL 2.5 08/04/2022   At goal on crestor- conitnue same.    PAF (paroxysmal atrial fibrillation) ([facility]) Assessment & Plan: Rate stable, maintained on metoprolol and Eliquis.    Age-related osteoporosis with current pathological fracture with routine healing, subsequent encounter Assessment & Plan: Dexa up to date.  Maintained on fosamax weekly.    Hypothyroidism, unspecified type Assessment & Plan: Lab Results  Component Value Date   TSH 3.63 08/04/2022   Last TSH stable. Continue synthroid.      I, Nance Pear, NP, personally preformed the services described in this documentation.  All medical record entries made by the scribe were at my direction and in my presence.  I have reviewed the chart and discharge instructions (if applicable) and agree that the record reflects my personal performance and is accurate and complete. 11/22/2022   I,Shehryar Baig,acting as a Education administrator for Nance Pear, NP.,have documented all relevant documentation on the behalf of Nance Pear, NP,as directed by  Nance Pear, NP while in the presence of Nance Pear, NP.   Nance Pear, NP

## 2022-11-22 NOTE — Assessment & Plan Note (Signed)
Lab Results  Component Value Date   HGBA1C 6.2 11/01/2021   Will check A1C.

## 2022-11-22 NOTE — Assessment & Plan Note (Signed)
Rate stable, maintained on metoprolol and Eliquis.

## 2022-11-22 NOTE — Assessment & Plan Note (Signed)
Followed by cardiology.  She is scheduled to establish with Dr. Geraldo Pitter in May as her cardiologist retired.

## 2022-11-22 NOTE — Assessment & Plan Note (Signed)
Lab Results  Component Value Date   CHOL 180 08/04/2022   HDL 73 08/04/2022   LDLCALC 85 08/04/2022   TRIG 127 08/04/2022   CHOLHDL 2.5 08/04/2022   At goal on crestor- conitnue same.

## 2022-11-22 NOTE — Assessment & Plan Note (Signed)
Dexa up to date.  Maintained on fosamax weekly.

## 2022-11-23 ENCOUNTER — Ambulatory Visit: Payer: PPO | Admitting: Family Medicine

## 2022-12-13 DIAGNOSIS — Z1231 Encounter for screening mammogram for malignant neoplasm of breast: Secondary | ICD-10-CM | POA: Diagnosis not present

## 2022-12-13 LAB — HM MAMMOGRAPHY

## 2023-01-09 DIAGNOSIS — H43813 Vitreous degeneration, bilateral: Secondary | ICD-10-CM | POA: Diagnosis not present

## 2023-01-09 DIAGNOSIS — H524 Presbyopia: Secondary | ICD-10-CM | POA: Diagnosis not present

## 2023-01-09 DIAGNOSIS — H52203 Unspecified astigmatism, bilateral: Secondary | ICD-10-CM | POA: Diagnosis not present

## 2023-01-09 DIAGNOSIS — H0100B Unspecified blepharitis left eye, upper and lower eyelids: Secondary | ICD-10-CM | POA: Diagnosis not present

## 2023-01-09 DIAGNOSIS — H0100A Unspecified blepharitis right eye, upper and lower eyelids: Secondary | ICD-10-CM | POA: Diagnosis not present

## 2023-01-09 DIAGNOSIS — H04123 Dry eye syndrome of bilateral lacrimal glands: Secondary | ICD-10-CM | POA: Diagnosis not present

## 2023-01-09 DIAGNOSIS — Z961 Presence of intraocular lens: Secondary | ICD-10-CM | POA: Diagnosis not present

## 2023-01-09 DIAGNOSIS — H26491 Other secondary cataract, right eye: Secondary | ICD-10-CM | POA: Diagnosis not present

## 2023-02-05 ENCOUNTER — Encounter: Payer: Self-pay | Admitting: *Deleted

## 2023-02-05 NOTE — Assessment & Plan Note (Signed)
Well controlled, no changes to meds. Encouraged heart healthy diet such as the DASH diet and exercise as tolerated.  °

## 2023-02-05 NOTE — Assessment & Plan Note (Signed)
Encouraged to get adequate exercise, calcium and vitamin d intake 

## 2023-02-05 NOTE — Assessment & Plan Note (Signed)
hgba1c acceptable, minimize simple carbs. Increase exercise as tolerated.  

## 2023-02-05 NOTE — Assessment & Plan Note (Signed)
Encourage heart healthy diet such as MIND or DASH diet, increase exercise, avoid trans fats, simple carbohydrates and processed foods, consider a krill or fish or flaxseed oil cap daily.  °

## 2023-02-05 NOTE — Assessment & Plan Note (Signed)
On Levothyroxine, continue to monitor 

## 2023-02-06 ENCOUNTER — Other Ambulatory Visit: Payer: Self-pay

## 2023-02-06 ENCOUNTER — Ambulatory Visit (INDEPENDENT_AMBULATORY_CARE_PROVIDER_SITE_OTHER): Payer: PPO | Admitting: Family Medicine

## 2023-02-06 VITALS — BP 124/64 | HR 70 | Temp 97.7°F | Resp 16 | Ht 61.0 in | Wt 220.4 lb

## 2023-02-06 DIAGNOSIS — M8000XD Age-related osteoporosis with current pathological fracture, unspecified site, subsequent encounter for fracture with routine healing: Secondary | ICD-10-CM

## 2023-02-06 DIAGNOSIS — E785 Hyperlipidemia, unspecified: Secondary | ICD-10-CM

## 2023-02-06 DIAGNOSIS — R739 Hyperglycemia, unspecified: Secondary | ICD-10-CM | POA: Diagnosis not present

## 2023-02-06 DIAGNOSIS — E039 Hypothyroidism, unspecified: Secondary | ICD-10-CM

## 2023-02-06 DIAGNOSIS — I10 Essential (primary) hypertension: Secondary | ICD-10-CM

## 2023-02-06 MED ORDER — ROSUVASTATIN CALCIUM 40 MG PO TABS: 20.0000 mg | ORAL_TABLET | Freq: Every day | ORAL | 1 refills | Status: DC

## 2023-02-06 NOTE — Patient Instructions (Signed)
Shingrix is the new shingles shot, 2 shots over 2-6 months, confirm coverage with insurance and document, then can return here for shots with nurse appt or at pharmacy   RSV, Respiratory Synticial Virus vaccine, Arexvy at pharmacy

## 2023-02-06 NOTE — Progress Notes (Unsigned)
Subjective:    Patient ID: Wendy Munoz, female    DOB: 1941/07/07, 82 y.o.   MRN: [id number]  No chief complaint on file.   HPI Patient is in today for follow up on chronic medical concerns. No recent febrile illness or hospitalizations. Denies CP/palp/SOB/HA/congestion/fevers/GI or GU c/o. Taking meds as prescribed. She feels fully recovered from a recent infection and has no acute complaints. She is eating some better.  Past Medical History:  Diagnosis Date   Allergic state 06/26/2017   Anemia 05/21/2017   Arthritis    Atrial tachycardia    a. asymptomatic. Noted on tele during 11/2014 admission    CAD (coronary artery disease)    a. 11/2014 inf STEMI s/p DES to RCA; 50-70% ruptured plaque in pLAD- rx medically   GERD (gastroesophageal reflux disease)    HTN (hypertension)    Hyperglycemia    Hyperlipidemia    Hypothyroidism    Insomnia 05/27/2017   Ischemic cardiomyopathy    a. 11/2014  LV gram with inferior hypokinesis. EF 45-50%.   Nocturia 01/08/2017   Obesity 06/26/2017   Osteoporosis    PAF (paroxysmal atrial fibrillation)    a. isolated episode of atrial fibrillation several years ago associated w/ her thyroid dysfunction (prior to ablation).     Prediabetes    Preventative health care 06/26/2017   Vasomotor rhinitis 06/26/2017    Past Surgical History:  Procedure Laterality Date   BREAST SURGERY     breast biospy   CATARACT EXTRACTION Bilateral    COLONOSCOPY     EYE SURGERY Bilateral 2005, 2006   Cataract with lens ImplaNT    INGUINAL HERNIA REPAIR Right    LEFT HEART CATHETERIZATION WITH CORONARY ANGIOGRAM N/A 12/02/2014   Procedure: LEFT HEART CATHETERIZATION WITH CORONARY ANGIOGRAM;  Surgeon: Lesleigh Noe, MD;  Location: [facility] CATH LAB;  Service: Cardiovascular;  Laterality: N/A;   ORIF HUMERUS FRACTURE Right 01/27/2014   Procedure: RIGHT OPEN REDUCTION INTERNAL FIXATION (ORIF) PROXIMAL HUMERUS FRACTURE;  Surgeon: Budd Palmer, MD;  Location: [facility] OR;  Service:  Orthopedics;  Laterality: Right;   TOTAL KNEE ARTHROPLASTY Right 2006   TOTAL KNEE ARTHROPLASTY Left 2011   UMBILICAL HERNIA REPAIR     as a baby    Family History  Problem Relation Age of Onset   CAD Mother    Heart disease Mother    Heart failure Mother    Polycythemia Father    Multiple myeloma Father    Cancer Sister        breast cancer   Stroke Sister    Hyperlipidemia Sister    Hypertension Sister    Breast cancer Sister 64   Neuropathy Sister    Heart disease Brother        bradycardia   Hypertension Brother    Atrial fibrillation Daughter    Arthritis Maternal Aunt    Breast cancer Maternal Aunt    Lung cancer Paternal Grandmother    Prostate cancer Paternal Grandfather    Diabetes Paternal Grandfather    Breast cancer Other     Social History   Socioeconomic History   Marital status: Married    Spouse name: Not on file   Number of children: Not on file   Years of education: Not on file   Highest education level: 12th grade  Occupational History   Occupation: retired   Tobacco Use   Smoking status: Never   Smokeless tobacco: Never  Vaping Use   Vaping Use:  Never used  Substance and Sexual Activity   Alcohol use: No    Alcohol/week: 0.0 standard drinks of alcohol   Drug use: No   Sexual activity: Yes    Partners: Male  Other Topics Concern   Not on file  Social History Narrative   Lives with husband   No pets   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    One daughter   Social Determinants of Health   Financial Resource Strain: Low Risk  (02/03/2023)   Overall Financial Resource Strain (CARDIA)    Difficulty of Paying Living Expenses: Not very hard  Food Insecurity: No Food Insecurity (02/03/2023)   Hunger Vital Sign    Worried About Running Out of Food in the Last Year: Never true    Ran Out of Food in the Last Year: Never true  Transportation Needs: No Transportation Needs (02/03/2023)   PRAPARE - Scientist, research (physical sciences) (Medical): No    Lack of Transportation (Non-Medical): No  Physical Activity: Unknown (02/03/2023)   Exercise Vital Sign    Days of Exercise per Week: Patient declined    Minutes of Exercise per Session: Not on file  Stress: No Stress Concern Present (02/03/2023)   [employer] of Occupational Health - Occupational Stress Questionnaire    Feeling of Stress : Not at all  Social Connections: Socially [place] (02/03/2023)   Social Connection and Isolation Panel [NHANES]    Frequency of Communication with Friends and Family: Three times a week    Frequency of Social Gatherings with Friends and Family: Patient declined    Attends Religious Services: More than 4 times per year    Active Member of [employer] or Organizations: Yes    Attends Engineer, structural: More than 4 times per year    Marital Status: Married  Catering manager Violence: Not At Risk (08/30/2021)   Humiliation, Afraid, Rape, and Kick questionnaire    Fear of Current or Ex-Partner: No    Emotionally Abused: No    Physically Abused: No    Sexually Abused: No    Outpatient Medications Prior to Visit  Medication Sig Dispense Refill   acyclovir ointment (ZOVIRAX) 5 % Apply small amount to affected 5 x daily x 4 days and as needed cold sore 30 g 1   alendronate (FOSAMAX) 70 MG tablet TAKE 1 TABLET BY MOUTH EVERY 7 DAYS. 12 tablet 3   Calcium-Magnesium-Vitamin D (CALCIUM MAGNESIUM PO) Take 1 capsule by mouth daily.     cetirizine (ZYRTEC) 10 MG tablet Take 10 mg by mouth at bedtime.     Cholecalciferol (VITAMIN D) 2000 UNITS tablet Take 6,000 Units by mouth daily.     Coenzyme Q10 200 MG TABS Take 200 mg by mouth daily.     ELIQUIS 5 MG TABS tablet TAKE 1 TABLET BY MOUTH TWICE A DAY 180 tablet 1   levothyroxine (SYNTHROID) 88 MCG tablet TAKE 1 TABLET BY MOUTH EVERY DAY BEFORE BREAKFAST SKIP THE SATURDAY DOSE 90 tablet 1   metoprolol tartrate (LOPRESSOR) 25 MG tablet TAKE 1 TABLET BY MOUTH TWICE A DAY  180 tablet 3   metroNIDAZOLE (METROGEL) 1 % gel Apply topically daily. 45 g 2   nitroGLYCERIN (NITROSTAT) 0.4 MG SL tablet Place 1 tablet (0.4 mg total) under the tongue every 5 (five) minutes as needed for chest pain. 25 tablet 3   nystatin cream (MYCOSTATIN) Apply 1 application topically 2 (two) times daily as needed for dry skin (  rash on abdomen). 30 g 2   rosuvastatin (CRESTOR) 40 MG tablet TAKE 1/2 TABLET BY MOUTH EVERY DAY 45 tablet 1   No facility-administered medications prior to visit.    Allergies  Allergen Reactions   Keflex [Cephalexin] Rash    Review of Systems  Constitutional:  Negative for fever and malaise/fatigue.  HENT:  Negative for congestion.   Eyes:  Negative for blurred vision.  Respiratory:  Negative for shortness of breath.   Cardiovascular:  Negative for chest pain, palpitations and leg swelling.  Gastrointestinal:  Negative for abdominal pain, blood in stool and nausea.  Genitourinary:  Negative for dysuria and frequency.  Musculoskeletal:  Negative for falls.  Skin:  Negative for rash.  Neurological:  Negative for dizziness, loss of consciousness and headaches.  Endo/Heme/Allergies:  Negative for environmental allergies.  Psychiatric/Behavioral:  Negative for depression. The patient is not nervous/anxious.        Objective:    Physical Exam Constitutional:      General: She is not in acute distress.    Appearance: Normal appearance. She is well-developed. She is not toxic-appearing.  HENT:     Head: Normocephalic and atraumatic.     Right Ear: External ear normal.     Left Ear: External ear normal.     Nose: Nose normal.  Eyes:     General:        Right eye: No discharge.        Left eye: No discharge.     Conjunctiva/sclera: Conjunctivae normal.  Neck:     Thyroid: No thyromegaly.  Cardiovascular:     Rate and Rhythm: Normal rate and regular rhythm.     Heart sounds: Normal heart sounds. No murmur heard. Pulmonary:     Effort: Pulmonary  effort is normal. No respiratory distress.     Breath sounds: Normal breath sounds.  Abdominal:     General: Bowel sounds are normal.     Palpations: Abdomen is soft.     Tenderness: There is no abdominal tenderness. There is no guarding.  Musculoskeletal:        General: Normal range of motion.     Cervical back: Neck supple.  Lymphadenopathy:     Cervical: No cervical adenopathy.  Skin:    General: Skin is warm and dry.  Neurological:     Mental Status: She is alert and oriented to person, place, and time.  Psychiatric:        Mood and Affect: Mood normal.        Behavior: Behavior normal.        Thought Content: Thought content normal.        Judgment: Judgment normal.     There were no vitals taken for this visit. Wt Readings from Last 3 Encounters:  11/22/22 219 lb (99.3 kg)  08/04/22 218 lb (98.9 kg)  03/16/22 216 lb 6.4 oz (98.2 kg)    Diabetic Foot Exam - Simple   No data filed    Lab Results  Component Value Date   WBC 5.5 03/16/2022   HGB 12.1 03/16/2022   HCT 36.4 03/16/2022   PLT 202.0 03/16/2022   GLUCOSE 95 11/22/2022   CHOL 180 08/04/2022   TRIG 127 08/04/2022   HDL 73 08/04/2022   LDLCALC 85 08/04/2022   ALT 16 08/04/2022   AST 25 08/04/2022   NA 138 11/22/2022   K 4.1 11/22/2022   CL 103 11/22/2022   CREATININE 1.13 11/22/2022   BUN 15 11/22/2022  CO2 27 11/22/2022   TSH 3.63 08/04/2022   INR 1.05 01/27/2014   HGBA1C 6.3 11/22/2022    Lab Results  Component Value Date   TSH 3.63 08/04/2022   Lab Results  Component Value Date   WBC 5.5 03/16/2022   HGB 12.1 03/16/2022   HCT 36.4 03/16/2022   MCV 96.0 03/16/2022   PLT 202.0 03/16/2022   Lab Results  Component Value Date   NA 138 11/22/2022   K 4.1 11/22/2022   CO2 27 11/22/2022   GLUCOSE 95 11/22/2022   BUN 15 11/22/2022   CREATININE 1.13 11/22/2022   BILITOT 0.7 08/04/2022   ALKPHOS 60 03/16/2022   AST 25 08/04/2022   ALT 16 08/04/2022   PROT 6.8 08/04/2022   ALBUMIN  4.4 03/16/2022   CALCIUM 9.2 11/22/2022   ANIONGAP 7 04/15/2015   GFR 45.75 (L) 11/22/2022   Lab Results  Component Value Date   CHOL 180 08/04/2022   Lab Results  Component Value Date   HDL 73 08/04/2022   Lab Results  Component Value Date   LDLCALC 85 08/04/2022   Lab Results  Component Value Date   TRIG 127 08/04/2022   Lab Results  Component Value Date   CHOLHDL 2.5 08/04/2022   Lab Results  Component Value Date   HGBA1C 6.3 11/22/2022       Assessment & Plan:  Primary hypertension Assessment & Plan: Well controlled, no changes to meds. Encouraged heart healthy diet such as the DASH diet and exercise as tolerated.    Hyperglycemia Assessment & Plan: hgba1c acceptable, minimize simple carbs. Increase exercise as tolerated.    Hyperlipidemia, unspecified hyperlipidemia type Assessment & Plan: Encourage heart healthy diet such as MIND or DASH diet, increase exercise, avoid trans fats, simple carbohydrates and processed foods, consider a krill or fish or flaxseed oil cap daily.    Hypothyroidism, unspecified type Assessment & Plan: On Levothyroxine, continue to monitor   Age-related osteoporosis with current pathological fracture with routine healing, subsequent encounter Assessment & Plan: Encouraged to get adequate exercise, calcium and vitamin d intake      Danise Edge, MD

## 2023-02-07 LAB — COMPREHENSIVE METABOLIC PANEL
ALT: 12 U/L (ref 0–35)
AST: 22 U/L (ref 0–37)
Albumin: 4.2 g/dL (ref 3.5–5.2)
Alkaline Phosphatase: 62 U/L (ref 39–117)
BUN: 19 mg/dL (ref 6–23)
CO2: 26 mEq/L (ref 19–32)
Calcium: 9.4 mg/dL (ref 8.4–10.5)
Chloride: 102 mEq/L (ref 96–112)
Creatinine, Ser: 1.26 mg/dL — ABNORMAL HIGH (ref 0.40–1.20)
GFR: 40.09 mL/min — ABNORMAL LOW (ref >60.00)
Glucose, Bld: 107 mg/dL — ABNORMAL HIGH (ref 70–99)
Potassium: 4.2 mEq/L (ref 3.5–5.1)
Sodium: 138 mEq/L (ref 135–145)
Total Bilirubin: 0.8 mg/dL (ref 0.2–1.2)
Total Protein: 6.9 g/dL (ref 6.0–8.3)

## 2023-02-07 LAB — CBC WITH DIFFERENTIAL/PLATELET
Basophils Absolute: 0.1 10*3/uL (ref 0.0–0.1)
Basophils Relative: 1.4 % (ref 0.0–3.0)
Eosinophils Absolute: 0.1 10*3/uL (ref 0.0–0.7)
Eosinophils Relative: 3 % (ref 0.0–5.0)
HCT: 34.9 % — ABNORMAL LOW (ref 36.0–46.0)
Hemoglobin: 11.8 g/dL — ABNORMAL LOW (ref 12.0–15.0)
Lymphocytes Relative: 27.3 % (ref 12.0–46.0)
Lymphs Abs: 1.4 10*3/uL (ref 0.7–4.0)
MCHC: 33.7 g/dL (ref 30.0–36.0)
MCV: 95.4 fl (ref 78.0–100.0)
Monocytes Absolute: 0.8 10*3/uL (ref 0.1–1.0)
Monocytes Relative: 15.4 % — ABNORMAL HIGH (ref 3.0–12.0)
Neutro Abs: 2.7 10*3/uL (ref 1.4–7.7)
Neutrophils Relative %: 52.9 % (ref 43.0–77.0)
Platelets: 190 10*3/uL (ref 150.0–400.0)
RBC: 3.65 Mil/uL — ABNORMAL LOW (ref 3.87–5.11)
RDW: 14 % (ref 11.5–15.5)
WBC: 5.1 10*3/uL (ref 4.0–10.5)

## 2023-02-07 LAB — LIPID PANEL
Cholesterol: 181 mg/dL (ref 0–200)
HDL: 84.2 mg/dL (ref >39.00)
LDL Cholesterol: 78 mg/dL (ref 0–99)
NonHDL: 97.25
Total CHOL/HDL Ratio: 2
Triglycerides: 94 mg/dL (ref 0.0–149.0)
VLDL: 18.8 mg/dL (ref 0.0–40.0)

## 2023-02-07 LAB — TSH: TSH: 4.37 u[IU]/mL (ref 0.35–5.50)

## 2023-02-07 LAB — HEMOGLOBIN A1C: Hgb A1c MFr Bld: 6.7 % — ABNORMAL HIGH (ref 4.6–6.5)

## 2023-02-08 ENCOUNTER — Other Ambulatory Visit: Payer: Self-pay

## 2023-02-08 ENCOUNTER — Other Ambulatory Visit: Payer: PPO

## 2023-02-08 DIAGNOSIS — D649 Anemia, unspecified: Secondary | ICD-10-CM

## 2023-02-08 DIAGNOSIS — N289 Disorder of kidney and ureter, unspecified: Secondary | ICD-10-CM

## 2023-02-09 ENCOUNTER — Other Ambulatory Visit: Payer: Self-pay

## 2023-02-09 DIAGNOSIS — I10 Essential (primary) hypertension: Secondary | ICD-10-CM

## 2023-02-09 DIAGNOSIS — D649 Anemia, unspecified: Secondary | ICD-10-CM

## 2023-02-09 DIAGNOSIS — R739 Hyperglycemia, unspecified: Secondary | ICD-10-CM

## 2023-02-09 LAB — VITAMIN B12: Vitamin B-12: 322 pg/mL (ref 200–1100)

## 2023-02-16 DIAGNOSIS — M1990 Unspecified osteoarthritis, unspecified site: Secondary | ICD-10-CM | POA: Insufficient documentation

## 2023-02-16 DIAGNOSIS — R7303 Prediabetes: Secondary | ICD-10-CM | POA: Insufficient documentation

## 2023-02-22 ENCOUNTER — Other Ambulatory Visit: Payer: Self-pay

## 2023-02-22 DIAGNOSIS — I480 Paroxysmal atrial fibrillation: Secondary | ICD-10-CM

## 2023-02-22 DIAGNOSIS — I10 Essential (primary) hypertension: Secondary | ICD-10-CM

## 2023-02-22 DIAGNOSIS — I2510 Atherosclerotic heart disease of native coronary artery without angina pectoris: Secondary | ICD-10-CM

## 2023-02-22 DIAGNOSIS — R0683 Snoring: Secondary | ICD-10-CM

## 2023-02-22 MED ORDER — METOPROLOL TARTRATE 25 MG PO TABS
ORAL_TABLET | ORAL | 0 refills | Status: DC
Start: 2023-02-22 — End: 2023-02-26

## 2023-02-26 ENCOUNTER — Encounter: Payer: Self-pay | Admitting: Cardiology

## 2023-02-26 ENCOUNTER — Ambulatory Visit: Payer: PPO | Attending: Cardiology | Admitting: Cardiology

## 2023-02-26 DIAGNOSIS — I10 Essential (primary) hypertension: Secondary | ICD-10-CM

## 2023-02-26 DIAGNOSIS — R0683 Snoring: Secondary | ICD-10-CM | POA: Diagnosis not present

## 2023-02-26 DIAGNOSIS — I480 Paroxysmal atrial fibrillation: Secondary | ICD-10-CM

## 2023-02-26 DIAGNOSIS — I2510 Atherosclerotic heart disease of native coronary artery without angina pectoris: Secondary | ICD-10-CM | POA: Diagnosis not present

## 2023-02-26 MED ORDER — APIXABAN 5 MG PO TABS
5.0000 mg | ORAL_TABLET | Freq: Two times a day (BID) | ORAL | 3 refills | Status: DC
Start: 2023-02-26 — End: 2023-10-01

## 2023-02-26 NOTE — Patient Instructions (Signed)
Medication Instructions:  Your physician has recommended you make the following change in your medication:   Stop Metoprolol.  *If you need a refill on your cardiac medications before your next appointment, please call your pharmacy*   Lab Work: None ordered If you have labs (blood work) drawn today and your tests are completely normal, you will receive your results only by: MyChart Message (if you have MyChart) OR A paper copy in the mail If you have any lab test that is abnormal or we need to change your treatment, we will call you to review the results.   Testing/Procedures: Your physician has requested that you have an echocardiogram. Echocardiography is a painless test that uses sound waves to create images of your heart. It provides your doctor with information about the size and shape of your heart and how well your heart's chambers and valves are working. This procedure takes approximately one hour. There are no restrictions for this procedure. Please do NOT wear cologne, perfume, aftershave, or lotions (deodorant is allowed). Please arrive 15 minutes prior to your appointment time.     Follow-Up: At [facility], you and your health needs are our priority.  As part of our continuing mission to provide you with exceptional heart care, we have created designated Provider Care Teams.  These Care Teams include your primary Cardiologist (physician) and Advanced Practice Providers (APPs -  Physician Assistants and Nurse Practitioners) who all work together to provide you with the care you need, when you need it.  We recommend signing up for the patient portal called "MyChart".  Sign up information is provided on this After Visit Summary.  MyChart is used to connect with patients for Virtual Visits (Telemedicine).  Patients are able to view lab/test results, encounter notes, upcoming appointments, etc.  Non-urgent messages can be sent to your provider as well.   To learn more about  what you can do with MyChart, go to [email or web address].    Your next appointment:   9 month(s)  The format for your next appointment:   In Person  Provider:   Rajan Revankar, MD   Other Instructions Echocardiogram An echocardiogram is a test that uses sound waves (ultrasound) to produce images of the heart. Images from an echocardiogram can provide important information about: Heart size and shape. The size and thickness and movement of your heart's walls. Heart muscle function and strength. Heart valve function or if you have stenosis. Stenosis is when the heart valves are too narrow. If blood is flowing backward through the heart valves (regurgitation). A tumor or infectious growth around the heart valves. Areas of heart muscle that are not working well because of poor blood flow or injury from a heart attack. Aneurysm detection. An aneurysm is a weak or damaged part of an artery wall. The wall bulges out from the normal force of blood pumping through the body. Tell a health care provider about: Any allergies you have. All medicines you are taking, including vitamins, herbs, eye drops, creams, and over-the-counter medicines. Any blood disorders you have. Any surgeries you have had. Any medical conditions you have. Whether you are pregnant or may be pregnant. What are the risks? Generally, this is a safe test. However, problems may occur, including an allergic reaction to dye (contrast) that may be used during the test. What happens before the test? No specific preparation is needed. You may eat and drink normally. What happens during the test? You will take off your clothes from the waist   up and put on a hospital gown. Electrodes or electrocardiogram (ECG)patches may be placed on your chest. The electrodes or patches are then connected to a device that monitors your heart rate and rhythm. You will lie down on a table for an ultrasound exam. A gel will be applied to your  chest to help sound waves pass through your skin. A handheld device, called a transducer, will be pressed against your chest and moved over your heart. The transducer produces sound waves that travel to your heart and bounce back (or "echo" back) to the transducer. These sound waves will be captured in real-time and changed into images of your heart that can be viewed on a video monitor. The images will be recorded on a computer and reviewed by your health care provider. You may be asked to change positions or hold your breath for a short time. This makes it easier to get different views or better views of your heart. In some cases, you may receive contrast through an IV in one of your veins. This can improve the quality of the pictures from your heart. The procedure may vary among health care providers and hospitals.   What can I expect after the test? You may return to your normal, everyday life, including diet, activities, and medicines, unless your health care provider tells you not to do that. Follow these instructions at home: It is up to you to get the results of your test. Ask your health care provider, or the department that is doing the test, when your results will be ready. Keep all follow-up visits. This is important. Summary An echocardiogram is a test that uses sound waves (ultrasound) to produce images of the heart. Images from an echocardiogram can provide important information about the size and shape of your heart, heart muscle function, heart valve function, and other possible heart problems. You do not need to do anything to prepare before this test. You may eat and drink normally. After the echocardiogram is completed, you may return to your normal, everyday life, unless your health care provider tells you not to do that. This information is not intended to replace advice given to you by your health care provider. Make sure you discuss any questions you have with your health care  provider. Document Revised: 06/01/2020 Document Reviewed: 06/01/2020 Elsevier Patient Education  2021 Elsevier Inc.   Important Information About Sugar        

## 2023-02-26 NOTE — Progress Notes (Signed)
Cardiology Office Note:    Date:  02/26/2023   ID:  Wendy Munoz, DOB 1941-02-12, MRN [id number]  PCP:  Bradd Canary, MD  Cardiologist:  Garwin Brothers, MD   Referring MD: Bradd Canary, MD    ASSESSMENT:    1. PAF (paroxysmal atrial fibrillation) (HCC)   2. Snoring   3. Essential hypertension   4. Coronary artery disease involving native coronary artery of native heart without angina pectoris    PLAN:    In order of problems listed above:  Coronary artery disease: Secondary prevention stressed with the patient.  Importance of compliance with diet medication stressed and she vocalized understanding.  She was advised to ambulate to the best of her ability. Paroxysmal atrial fibrillation:I discussed with the patient atrial fibrillation, disease process. Management and therapy including rate and rhythm control, anticoagulation benefits and potential risks were discussed extensively with the patient. Patient had multiple questions which were answered to patient's satisfaction. She gives history of fatigue and she is on beta-blocker.  Her blood pressure is borderline especially on readings from home.  I have asked her to cut down the dose of beta-blocker and eventually stop it in a week or 2 until the summer she feels.  She will keep a track of her blood pressures and heart rate and send it to [place]. Mixed dyslipidemia: On lipid-lowering medications followed by primary care.  Lipids are fine.  I discussed diet. Morbid obesity: Weight reduction stressed and she promises to do better.  Stress of obesity explained. Patient will be seen in follow-up appointment in 6 months or earlier if the patient has any concerns.    Medication Adjustments/Labs and Tests Ordered: Current medicines are reviewed at length with the patient today.  Concerns regarding medicines are outlined above.  No orders of the defined types were placed in this encounter.  No orders of the defined types were placed  in this encounter.    No chief complaint on file.    History of Present Illness:    Wendy Munoz is a 82 y.o. female.  Patient has past medical history of coronary artery disease, essential hypertension, mixed dyslipidemia and paroxysmal fibrillation.  She is now seeing me because Dr. Katrinka Blazing has retired.  She denies any chest pain orthopnea or PND.  She takes care of activities of daily living.  At the time of my evaluation, the patient is alert awake oriented and in no distress.  Past Medical History:  Diagnosis Date   Allergic state 06/26/2017   Anemia 05/21/2017   Arthritis    Atrial tachycardia    a. asymptomatic. Noted on tele during 11/2014 admission    Bilateral sacral insufficiency fracture 06/29/2021   CAD (coronary artery disease)    a. 11/2014 inf STEMI s/p DES to RCA; 50-70% ruptured plaque in pLAD- rx medically   GERD (gastroesophageal reflux disease)    Hematuria 02/18/2017   Hemorrhoids 05/11/2019   HTN (hypertension)    Hyperglycemia    Hyperlipidemia    Hypothyroidism    Insomnia 05/27/2017   Ischemic cardiomyopathy    a. 11/2014  LV gram with inferior hypokinesis. EF 45-50%.   Macrocytic anemia 05/21/2017   Nocturia 01/08/2017   Obesity 06/26/2017   On apixaban therapy 02/12/2019   Osteoporosis    PAF (paroxysmal atrial fibrillation) (HCC)    a. isolated episode of atrial fibrillation several years ago associated w/ her thyroid dysfunction (prior to ablation).     Prediabetes  Preventative health care 06/26/2017   Rosacea 06/15/2021   S/P knee replacement 04/03/2013   Thickened endometrium 11/01/2021   Urinary frequency 05/27/2017   Vasomotor rhinitis 06/26/2017    Past Surgical History:  Procedure Laterality Date   BREAST SURGERY     breast biospy   CATARACT EXTRACTION Bilateral    COLONOSCOPY     EYE SURGERY Bilateral 2005, 2006   Cataract with lens ImplaNT    INGUINAL HERNIA REPAIR Right    LEFT HEART CATHETERIZATION WITH CORONARY ANGIOGRAM  N/A 12/02/2014   Procedure: LEFT HEART CATHETERIZATION WITH CORONARY ANGIOGRAM;  Surgeon: Lesleigh Noe, MD;  Location[facility] CATH LAB;  Service: Cardiovascular;  Laterality: N/A;   ORIF HUMERUS FRACTURE Right 01/27/2014   Procedure: RIGHT OPEN REDUCTION INTERNAL FIXATION (ORIF) PROXIMAL HUMERUS FRACTURE;  Surgeon: Budd Palmer, MD;  Location: [facility] OR;  Service: Orthopedics;  Laterality: Right;   TOTAL KNEE ARTHROPLASTY Right 2006   TOTAL KNEE ARTHROPLASTY Left 2011   UMBILICAL HERNIA REPAIR     as a baby    Current Medications: Current Meds  Medication Sig   acyclovir ointment (ZOVIRAX) 5 % Apply small amount to affected 5 x daily x 4 days and as needed cold sore   alendronate (FOSAMAX) 70 MG tablet TAKE 1 TABLET BY MOUTH EVERY 7 DAYS.   Calcium-Magnesium-Vitamin D (CALCIUM MAGNESIUM PO) Take 1 capsule by mouth daily.   cetirizine (ZYRTEC) 10 MG tablet Take 10 mg by mouth at bedtime.   Cholecalciferol (VITAMIN D) 2000 UNITS tablet Take 6,000 Units by mouth daily.   Coenzyme Q10 200 MG TABS Take 200 mg by mouth daily.   ELIQUIS 5 MG TABS tablet TAKE 1 TABLET BY MOUTH TWICE A DAY   levothyroxine (SYNTHROID) 88 MCG tablet TAKE 1 TABLET BY MOUTH EVERY DAY BEFORE BREAKFAST SKIP THE SATURDAY DOSE   metoprolol tartrate (LOPRESSOR) 25 MG tablet TAKE 1 TABLET BY MOUTH TWICE A DAY   metroNIDAZOLE (METROGEL) 1 % gel Apply topically daily.   nitroGLYCERIN (NITROSTAT) 0.4 MG SL tablet Place 1 tablet (0.4 mg total) under the tongue every 5 (five) minutes as needed for chest pain.   nystatin cream (MYCOSTATIN) Apply 1 application topically 2 (two) times daily as needed for dry skin (rash on abdomen).   rosuvastatin (CRESTOR) 40 MG tablet Take 0.5 tablets (20 mg total) by mouth daily.     Allergies:   Keflex [cephalexin]   Social History   Socioeconomic History   Marital status: Married    Spouse name: Not on file   Number of children: Not on file   Years of education: Not on file   Highest  education level: 12th grade  Occupational History   Occupation: retired   Tobacco Use   Smoking status: Never   Smokeless tobacco: Never  Vaping Use   Vaping Use: Never used  Substance and Sexual Activity   Alcohol use: No    Alcohol/week: 0.0 standard drinks of alcohol   Drug use: No   Sexual activity: Yes    Partners: Male  Other Topics Concern   Not on file  Social History Narrative   Lives with husband   No pets   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    One daughter   Social Determinants of Health   Financial Resource Strain: Low Risk  (02/03/2023)   Overall Financial Resource Strain (CARDIA)    Difficulty of Paying Living Expenses: Not very hard  Food Insecurity: No Food Insecurity (  02/03/2023)   Hunger Vital Sign    Worried About Running Out of Food in the Last Year: Never true    Ran Out of Food in the Last Year: Never true  Transportation Needs: No Transportation Needs (02/03/2023)   PRAPARE - Administrator, Civil Service (Medical): No    Lack of Transportation (Non-Medical): No  Physical Activity: Unknown (02/03/2023)   Exercise Vital Sign    Days of Exercise per Week: Patient declined    Minutes of Exercise per Session: Not on file  Stress: No Stress Concern Present (02/03/2023)   [employer] of Occupational Health - Occupational Stress Questionnaire    Feeling of Stress : Not at all  Social Connections: Socially Integrated (02/03/2023)   Social Connection and Isolation Panel [NHANES]    Frequency of Communication with Friends and Family: Three times a week    Frequency of Social Gatherings with Friends and Family: Patient declined    Attends Religious Services: More than 4 times per year    Active Member of [employer] or Organizations: Yes    Attends Engineer, structural: More than 4 times per year    Marital Status: Married     Family History: The patient's family history includes Arthritis in her maternal aunt; Atrial  fibrillation in her daughter; Breast cancer in her maternal aunt and another family member; Breast cancer (age of onset: 73) in her sister; CAD in her mother; Cancer in her sister; Diabetes in her paternal grandfather; Heart disease in her brother and mother; Heart failure in her mother; Hyperlipidemia in her sister; Hypertension in her brother and sister; Lung cancer in her paternal grandmother; Multiple myeloma in her father; Neuropathy in her sister; Polycythemia in her father; Prostate cancer in her paternal grandfather; Stroke in her sister.  ROS:   Please see the history of present illness.    All other systems reviewed and are negative.  EKGs/Labs/Other Studies Reviewed:    The following studies were reviewed today: EKG reveals sinus rhythm with nonspecific ST-T changes   Recent Labs: 02/06/2023: ALT 12; BUN 19; Creatinine, Ser 1.26; Hemoglobin 11.8; Platelets 190.0; Potassium 4.2; Sodium 138; TSH 4.37  Recent Lipid Panel    Component Value Date/Time   CHOL 181 02/06/2023 1438   TRIG 94.0 02/06/2023 1438   HDL 84.20 02/06/2023 1438   CHOLHDL 2 02/06/2023 1438   VLDL 18.8 02/06/2023 1438   LDLCALC 78 02/06/2023 1438   LDLCALC 85 08/04/2022 1435    Physical Exam:    VS:  BP 122/62   Pulse 62   Ht 5\' 1"  (1.549 m)   Wt 220 lb 12.8 oz (100.2 kg)   SpO2 97%   BMI 41.72 kg/m     Wt Readings from Last 3 Encounters:  02/26/23 220 lb 12.8 oz (100.2 kg)  02/06/23 220 lb 6.4 oz (100 kg)  11/22/22 219 lb (99.3 kg)     GEN: Patient is in no acute distress HEENT: Normal NECK: No JVD; No carotid bruits LYMPHATICS: No lymphadenopathy CARDIAC: Hear sounds regular, 2/6 systolic murmur at the apex. RESPIRATORY:  Clear to auscultation without rales, wheezing or rhonchi  ABDOMEN: Soft, non-tender, non-distended MUSCULOSKELETAL:  No edema; No deformity  SKIN: Warm and dry NEUROLOGIC:  Alert and oriented x 3 PSYCHIATRIC:  Normal affect   Signed, Garwin Brothers, MD  02/26/2023  2:05 PM    [facility]

## 2023-02-27 ENCOUNTER — Encounter: Payer: Self-pay | Admitting: Family Medicine

## 2023-03-01 ENCOUNTER — Other Ambulatory Visit: Payer: Self-pay

## 2023-03-01 ENCOUNTER — Ambulatory Visit: Payer: PPO | Attending: Cardiology

## 2023-03-01 DIAGNOSIS — I480 Paroxysmal atrial fibrillation: Secondary | ICD-10-CM

## 2023-03-01 DIAGNOSIS — I2510 Atherosclerotic heart disease of native coronary artery without angina pectoris: Secondary | ICD-10-CM

## 2023-03-01 LAB — ECHOCARDIOGRAM COMPLETE
Calc EF: 51.3 %
S' Lateral: 3.4 cm
Single Plane A2C EF: 52.4 %
Single Plane A4C EF: 48.5 %

## 2023-03-01 MED ORDER — METOPROLOL TARTRATE 25 MG PO TABS: 12.5000 mg | ORAL_TABLET | Freq: Two times a day (BID) | ORAL | 3 refills | Status: DC

## 2023-03-07 ENCOUNTER — Other Ambulatory Visit: Payer: PPO

## 2023-03-26 ENCOUNTER — Other Ambulatory Visit: Payer: Self-pay | Admitting: Family Medicine

## 2023-03-27 ENCOUNTER — Telehealth: Payer: Self-pay | Admitting: Pharmacist

## 2023-03-27 NOTE — Telephone Encounter (Signed)
Patient is worried about cost of Eliquis. She thinks she might hit Medicare Coverage gap soon.  Checked with [facility] - she has spent $205 out of pocket so far in 2024 and her husband has spent $418 out of pocket.  Will have med assistance team send her application for Eliquis medication assistance program to start process.

## 2023-04-03 ENCOUNTER — Telehealth: Payer: Self-pay

## 2023-04-03 NOTE — Telephone Encounter (Signed)
PAP application for (ELIQUIS TO BMS(BRISTOL MYERS SQUIBB) has been mailed to pt home. I will fax PCP pages once I receive pt pages   Melanee Spry CPhT Rx [facility] [phone]

## 2023-04-23 ENCOUNTER — Other Ambulatory Visit (HOSPITAL_COMMUNITY): Payer: Self-pay

## 2023-05-08 ENCOUNTER — Other Ambulatory Visit (INDEPENDENT_AMBULATORY_CARE_PROVIDER_SITE_OTHER): Payer: PPO

## 2023-05-08 DIAGNOSIS — D649 Anemia, unspecified: Secondary | ICD-10-CM

## 2023-05-08 DIAGNOSIS — R739 Hyperglycemia, unspecified: Secondary | ICD-10-CM

## 2023-05-08 DIAGNOSIS — I10 Essential (primary) hypertension: Secondary | ICD-10-CM | POA: Diagnosis not present

## 2023-05-08 LAB — COMPREHENSIVE METABOLIC PANEL
ALT: 16 U/L (ref 0–35)
AST: 24 U/L (ref 0–37)
Albumin: 4 g/dL (ref 3.5–5.2)
Alkaline Phosphatase: 58 U/L (ref 39–117)
BUN: 20 mg/dL (ref 6–23)
CO2: 26 mEq/L (ref 19–32)
Calcium: 9.4 mg/dL (ref 8.4–10.5)
Chloride: 104 mEq/L (ref 96–112)
Creatinine, Ser: 1.16 mg/dL (ref 0.40–1.20)
GFR: 44.19 mL/min — ABNORMAL LOW (ref >60.00)
Glucose, Bld: 176 mg/dL — ABNORMAL HIGH (ref 70–99)
Potassium: 4.1 mEq/L (ref 3.5–5.1)
Sodium: 138 mEq/L (ref 135–145)
Total Bilirubin: 0.7 mg/dL (ref 0.2–1.2)
Total Protein: 6.6 g/dL (ref 6.0–8.3)

## 2023-05-08 LAB — CBC WITH DIFFERENTIAL/PLATELET
Basophils Absolute: 0 10*3/uL (ref 0.0–0.1)
Basophils Relative: 1 % (ref 0.0–3.0)
Eosinophils Absolute: 0.1 10*3/uL (ref 0.0–0.7)
Eosinophils Relative: 2.6 % (ref 0.0–5.0)
HCT: 33.9 % — ABNORMAL LOW (ref 36.0–46.0)
Hemoglobin: 11.3 g/dL — ABNORMAL LOW (ref 12.0–15.0)
Lymphocytes Relative: 26.6 % (ref 12.0–46.0)
Lymphs Abs: 1.2 10*3/uL (ref 0.7–4.0)
MCHC: 33.2 g/dL (ref 30.0–36.0)
MCV: 97.8 fl (ref 78.0–100.0)
Monocytes Absolute: 0.7 10*3/uL (ref 0.1–1.0)
Monocytes Relative: 15.7 % — ABNORMAL HIGH (ref 3.0–12.0)
Neutro Abs: 2.5 10*3/uL (ref 1.4–7.7)
Neutrophils Relative %: 54.1 % (ref 43.0–77.0)
Platelets: 179 10*3/uL (ref 150.0–400.0)
RBC: 3.47 Mil/uL — ABNORMAL LOW (ref 3.87–5.11)
RDW: 13.9 % (ref 11.5–15.5)
WBC: 4.6 10*3/uL (ref 4.0–10.5)

## 2023-05-08 LAB — HEMOGLOBIN A1C: Hgb A1c MFr Bld: 6.5 % (ref 4.6–6.5)

## 2023-05-09 LAB — VITAMIN B12: Vitamin B-12: 636 pg/mL (ref 211–911)

## 2023-06-27 ENCOUNTER — Encounter: Payer: Self-pay | Admitting: Sports Medicine

## 2023-06-27 ENCOUNTER — Ambulatory Visit: Payer: PPO | Admitting: Sports Medicine

## 2023-06-27 VITALS — BP 130/80 | Ht 61.0 in | Wt 218.0 lb

## 2023-06-27 DIAGNOSIS — M810 Age-related osteoporosis without current pathological fracture: Secondary | ICD-10-CM

## 2023-06-27 MED ORDER — ALENDRONATE SODIUM 70 MG PO TABS: 70.0000 mg | ORAL_TABLET | ORAL | 3 refills | Status: DC

## 2023-06-27 NOTE — Progress Notes (Signed)
Patient ID: Wendy Munoz, female   DOB: [birth year], 82 y.o.   MRN: [id number]  Patient presents today requesting a refill on her Fosamax.  She was previously followed by Dr. Jordan Likes.  She has been on Fosamax since suffering an insufficiency sacral fracture in October 2022.  She is tolerating it well.  She is here today with her husband.  Previous discussion has been had with Dr. Jordan Likes about alternative osteoporosis treatments but the decision was made for her to take Fosamax instead.  Her next DEXA scan is scheduled for February of next year.  Physical exam was not repeated today.  I will refill her Fosamax and she will send a copy of her next DEXA scan to this office.  This note was dictated using Dragon naturally speaking software and may contain errors in syntax, spelling, or content which have not been identified prior to signing this note.

## 2023-07-03 ENCOUNTER — Encounter: Payer: Self-pay | Admitting: Family Medicine

## 2023-07-04 NOTE — Telephone Encounter (Signed)
Pt wife sent message regarding her husband and message has been copy into his chart. Pt wife was advised message will be sent  to Tammy our pharmacist to look into medication. Spoke with Tammy and said to send the message her.

## 2023-08-09 ENCOUNTER — Encounter: Payer: PPO | Admitting: Family Medicine

## 2023-08-10 ENCOUNTER — Ambulatory Visit (INDEPENDENT_AMBULATORY_CARE_PROVIDER_SITE_OTHER): Payer: PPO | Admitting: Family

## 2023-08-10 ENCOUNTER — Telehealth: Payer: Self-pay | Admitting: Family

## 2023-08-10 VITALS — BP 128/59 | HR 64 | Temp 98.0°F | Resp 16 | Ht 61.0 in | Wt 218.0 lb

## 2023-08-10 DIAGNOSIS — I2510 Atherosclerotic heart disease of native coronary artery without angina pectoris: Secondary | ICD-10-CM | POA: Diagnosis not present

## 2023-08-10 DIAGNOSIS — I10 Essential (primary) hypertension: Secondary | ICD-10-CM

## 2023-08-10 DIAGNOSIS — E785 Hyperlipidemia, unspecified: Secondary | ICD-10-CM

## 2023-08-10 DIAGNOSIS — E039 Hypothyroidism, unspecified: Secondary | ICD-10-CM | POA: Diagnosis not present

## 2023-08-10 DIAGNOSIS — E118 Type 2 diabetes mellitus with unspecified complications: Secondary | ICD-10-CM | POA: Insufficient documentation

## 2023-08-10 DIAGNOSIS — T7840XD Allergy, unspecified, subsequent encounter: Secondary | ICD-10-CM

## 2023-08-10 DIAGNOSIS — Z0000 Encounter for general adult medical examination without abnormal findings: Secondary | ICD-10-CM | POA: Diagnosis not present

## 2023-08-10 HISTORY — DX: Type 2 diabetes mellitus with unspecified complications: E11.8

## 2023-08-10 NOTE — Telephone Encounter (Signed)
Please call [facility] to request a copy of  DM eye.

## 2023-08-10 NOTE — Patient Instructions (Signed)
Add flonase 2 sprays each nostril daily.  Please work on reducing your sugar and carbs in your diet and increasing  exercise.

## 2023-08-10 NOTE — Telephone Encounter (Signed)
Electronic request signed

## 2023-08-10 NOTE — Assessment & Plan Note (Signed)
Lab Results  Component Value Date   CHOL 181 02/06/2023   HDL 84.20 02/06/2023   LDLCALC 78 02/06/2023   TRIG 94.0 02/06/2023   CHOLHDL 2 02/06/2023   LDL at goal crestor.

## 2023-08-10 NOTE — Assessment & Plan Note (Signed)
Management per cardiology. Clinically stable.

## 2023-08-10 NOTE — Assessment & Plan Note (Signed)
BP Readings from Last 3 Encounters:  08/10/23 (!) 128/59  06/27/23 130/80  02/26/23 122/62   BP at goal, continues metoprolol.

## 2023-08-10 NOTE — Assessment & Plan Note (Signed)
Lab Results  Component Value Date   TSH 4.37 02/06/2023  Clinically stable on synthroid.  Update tsh.

## 2023-08-10 NOTE — Progress Notes (Signed)
Subjective:     Patient ID: Wendy Munoz, female    DOB: Dec 11, 1940, 82 y.o.   MRN: [id number]  Chief Complaint  Patient presents with   Annual Exam    HPI  Discussed the use of AI scribe software for clinical note transcription with the patient, who gave verbal consent to proceed.  History of Present Illness         Patient presents today for complete physical.  Immunizations: declines covid vaccine Diet: repots too many cookies/chips. Wt Readings from Last 3 Encounters:  08/10/23 218 lb (98.9 kg)  06/27/23 218 lb (98.9 kg)  02/26/23 220 lb 12.8 oz (100.2 kg)  Exercise:  no regular exercise.   Colonoscopy: aged out Dexa: 12/07/21 Pap Smear: aged out Mammogram: 12/07/2021, scheduled or 2/24 Vision: up to date Dental: up to date     Health Maintenance Due  Topic Date Due   FOOT EXAM  Never done   OPHTHALMOLOGY EXAM  Never done   Diabetic kidney evaluation - Urine ACR  Never done   Zoster Vaccines- Shingrix (1 of 2) Never done   Medicare Annual Wellness (AWV)  09/02/2023    Past Medical History:  Diagnosis Date   Allergic state 06/26/2017   Anemia 05/21/2017   Arthritis    Atrial tachycardia    a. asymptomatic. Noted on tele during 11/2014 admission    Bilateral sacral insufficiency fracture 06/29/2021   CAD (coronary artery disease)    a. 11/2014 inf STEMI s/p DES to RCA; 50-70% ruptured plaque in pLAD- rx medically   GERD (gastroesophageal reflux disease)    Hematuria 02/18/2017   Hemorrhoids 05/11/2019   HTN (hypertension)    Hyperglycemia    Hyperlipidemia    Hypothyroidism    Insomnia 05/27/2017   Ischemic cardiomyopathy    a. 11/2014  LV gram with inferior hypokinesis. EF 45-50%.   Macrocytic anemia 05/21/2017   Nocturia 01/08/2017   Obesity 06/26/2017   On apixaban therapy 02/12/2019   Osteoporosis    PAF (paroxysmal atrial fibrillation) (HCC)    a. isolated episode of atrial fibrillation several years ago associated w/ her thyroid  dysfunction (prior to ablation).     Prediabetes    Preventative health care 06/26/2017   Rosacea 06/15/2021   S/P knee replacement 04/03/2013   Thickened endometrium 11/01/2021   Urinary frequency 05/27/2017   Vasomotor rhinitis 06/26/2017    Past Surgical History:  Procedure Laterality Date   BREAST SURGERY     breast biospy   CATARACT EXTRACTION Bilateral    COLONOSCOPY     EYE SURGERY Bilateral 2005, 2006   Cataract with lens ImplaNT    INGUINAL HERNIA REPAIR Right    LEFT HEART CATHETERIZATION WITH CORONARY ANGIOGRAM N/A 12/02/2014   Procedure: LEFT HEART CATHETERIZATION WITH CORONARY ANGIOGRAM;  Surgeon: Lesleigh Noe, MD;  Location: [facility] CATH LAB;  Service: Cardiovascular;  Laterality: N/A;   ORIF HUMERUS FRACTURE Right 01/27/2014   Procedure: RIGHT OPEN REDUCTION INTERNAL FIXATION (ORIF) PROXIMAL HUMERUS FRACTURE;  Surgeon: Budd Palmer, MD;  Location: [facility] OR;  Service: Orthopedics;  Laterality: Right;   TOTAL KNEE ARTHROPLASTY Right 2006   TOTAL KNEE ARTHROPLASTY Left 2011   UMBILICAL HERNIA REPAIR     as a baby    Family History  Problem Relation Age of Onset   CAD Mother    Heart disease Mother    Heart failure Mother    Polycythemia Father    Multiple myeloma Father    Cancer  Sister        breast cancer   Stroke Sister    Hyperlipidemia Sister    Hypertension Sister    Breast cancer Sister 20   Neuropathy Sister    Heart disease Brother        bradycardia   Hypertension Brother    Atrial fibrillation Daughter    Arthritis Maternal Aunt    Breast cancer Maternal Aunt    Lung cancer Paternal Grandmother    Prostate cancer Paternal Grandfather    Diabetes Paternal Grandfather    Breast cancer Other     Social History   Socioeconomic History   Marital status: Married    Spouse name: Not on file   Number of children: Not on file   Years of education: Not on file   Highest education level: 12th grade  Occupational History   Occupation: retired    Tobacco Use   Smoking status: Never   Smokeless tobacco: Never  Vaping Use   Vaping status: Never Used  Substance and Sexual Activity   Alcohol use: No    Alcohol/week: 0.0 standard drinks of alcohol   Drug use: No   Sexual activity: Yes    Partners: Male  Other Topics Concern   Not on file  Social History Narrative   Lives with husband   No pets   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    One daughter   Social Determinants of Health   Financial Resource Strain: Low Risk  (02/03/2023)   Overall Financial Resource Strain (CARDIA)    Difficulty of Paying Living Expenses: Not very hard  Food Insecurity: No Food Insecurity (02/03/2023)   Hunger Vital Sign    Worried About Running Out of Food in the Last Year: Never true    Ran Out of Food in the Last Year: Never true  Transportation Needs: No Transportation Needs (02/03/2023)   PRAPARE - Administrator, Civil Service (Medical): No    Lack of Transportation (Non-Medical): No  Physical Activity: Unknown (02/03/2023)   Exercise Vital Sign    Days of Exercise per Week: Patient declined    Minutes of Exercise per Session: Not on file  Stress: No Stress Concern Present (02/03/2023)   [employer] of Occupational Health - Occupational Stress Questionnaire    Feeling of Stress : Not at all  Social Connections: Socially Integrated (02/03/2023)   Social Connection and Isolation Panel [NHANES]    Frequency of Communication with Friends and Family: Three times a week    Frequency of Social Gatherings with Friends and Family: Patient declined    Attends Religious Services: More than 4 times per year    Active Member of [employer] or Organizations: Yes    Attends Engineer, structural: More than 4 times per year    Marital Status: Married  Catering manager Violence: Not At Risk (08/30/2021)   Humiliation, Afraid, Rape, and Kick questionnaire    Fear of Current or Ex-Partner: No    Emotionally Abused: No     Physically Abused: No    Sexually Abused: No    Outpatient Medications Prior to Visit  Medication Sig Dispense Refill   acyclovir ointment (ZOVIRAX) 5 % Apply small amount to affected 5 x daily x 4 days and as needed cold sore 30 g 1   alendronate (FOSAMAX) 70 MG tablet Take 1 tablet (70 mg total) by mouth every 7 (seven) days. TAKE 1 TABLET BY MOUTH EVERY 7 DAYS. 12  tablet 3   apixaban (ELIQUIS) 5 MG TABS tablet Take 1 tablet (5 mg total) by mouth 2 (two) times daily. 180 tablet 3   Calcium-Magnesium-Vitamin D (CALCIUM MAGNESIUM PO) Take 1 capsule by mouth daily.     cetirizine (ZYRTEC) 10 MG tablet Take 10 mg by mouth at bedtime.     Cholecalciferol (VITAMIN D) 2000 UNITS tablet Take 6,000 Units by mouth daily.     Coenzyme Q10 200 MG TABS Take 200 mg by mouth daily.     levothyroxine (SYNTHROID) 88 MCG tablet TAKE 1 TABLET BY MOUTH EVERY DAY BEFORE BREAKFAST SKIP THE SATURDAY DOSE 90 tablet 1   metoprolol tartrate (LOPRESSOR) 25 MG tablet Take 0.5 tablets (12.5 mg total) by mouth 2 (two) times daily. 45 tablet 3   metroNIDAZOLE (METROGEL) 1 % gel Apply topically daily. 45 g 2   nitroGLYCERIN (NITROSTAT) 0.4 MG SL tablet Place 1 tablet (0.4 mg total) under the tongue every 5 (five) minutes as needed for chest pain. 25 tablet 3   nystatin cream (MYCOSTATIN) Apply 1 application topically 2 (two) times daily as needed for dry skin (rash on abdomen). 30 g 2   rosuvastatin (CRESTOR) 40 MG tablet Take 0.5 tablets (20 mg total) by mouth daily. 90 tablet 1   No facility-administered medications prior to visit.    Allergies  Allergen Reactions   Keflex [Cephalexin] Rash    Review of Systems  Constitutional:  Negative for weight loss.  HENT:  Positive for congestion (due to allergies) and hearing loss.   Eyes:  Negative for blurred vision.  Respiratory:  Negative for cough.   Cardiovascular:  Positive for leg swelling (left leg- has varicose veins in the left leg).  Gastrointestinal:   Negative for constipation and diarrhea.       Hemorrhoids  Genitourinary:  Negative for dysuria.  Musculoskeletal:  Positive for joint pain (uses tylenol prn).  Skin:        Rosacea, has metrogel on hand.  Neurological:  Negative for headaches.  Psychiatric/Behavioral:         Denies depression/anxiety.        Objective:    Physical Exam   BP (!) 128/59 (BP Location: Left Arm, Patient Position: Sitting, Cuff Size: Large)   Pulse 64   Temp 98 F (36.7 C) (Oral)   Resp 16   Ht 5\' 1"  (1.549 m)   Wt 218 lb (98.9 kg)   SpO2 98%   BMI 41.19 kg/m  Wt Readings from Last 3 Encounters:  08/10/23 218 lb (98.9 kg)  06/27/23 218 lb (98.9 kg)  02/26/23 220 lb 12.8 oz (100.2 kg)  Physical Exam  Constitutional: She is oriented to person, place, and time. She appears well-developed and well-nourished. No distress.  HENT:  Head: Normocephalic and atraumatic.  Right Ear: Tympanic membrane and ear canal normal.  Left Ear: Tympanic membrane and ear canal normal.  Mouth/Throat: Oropharynx is clear and moist.  Eyes: Pupils are equal, round, and reactive to light. No scleral icterus.  Neck: Normal range of motion. No thyromegaly present.  Cardiovascular: Normal rate and regular rhythm.   No murmur heard. Pulmonary/Chest: Effort normal and breath sounds normal. No respiratory distress. He has no wheezes. She has no rales. She exhibits no tenderness.  Abdominal: Soft. Bowel sounds are normal. She exhibits no distension and no mass. There is no tenderness. There is no rebound and no guarding.  Musculoskeletal: She exhibits no edema.  Lymphadenopathy:    She has no cervical  adenopathy.  Neurological: She is alert and oriented to person, place, and time. She has absent patellar reflexes (hx of bilateral TKA). She exhibits normal muscle tone. Coordination normal.  Skin: Skin is warm and dry.  Psychiatric: She has a normal mood and affect. Her behavior is normal. Judgment and thought content  normal.  Breast/pelvic: deferred        Assessment & Plan:       Assessment & Plan:   Problem List Items Addressed This Visit       Unprioritized   Preventative health care    Discussed diet, exercise and weight loss. Mammo scheduled.  Declines covid vaccine, flu shot up to date. Recommend RSV at the pharmacy.       Hypothyroidism    Lab Results  Component Value Date   TSH 4.37 02/06/2023  Clinically stable on synthroid.  Update tsh.       Relevant Orders   TSH   Hyperlipidemia    Lab Results  Component Value Date   CHOL 181 02/06/2023   HDL 84.20 02/06/2023   LDLCALC 78 02/06/2023   TRIG 94.0 02/06/2023   CHOLHDL 2 02/06/2023   LDL at goal crestor.        HTN (hypertension)    BP Readings from Last 3 Encounters:  08/10/23 (!) 128/59  06/27/23 130/80  02/26/23 122/62   BP at goal, continues metoprolol.       Controlled type 2 diabetes mellitus with complication, without long-term current use of insulin (HCC) - Primary    A1C at goal, Discussed dietary modification. Will request DM eye exam.       Relevant Orders   Comp Met (CMET)   HgB A1c   Urine Microalbumin w/creat. ratio   CAD (coronary artery disease)    Management per cardiology. Clinically stable.      Allergy    Fall allergies- some improvement with zyrtec, add flonase daily        I am having Wendy Munoz. Wendy Munoz maintain her cetirizine, Vitamin D, Calcium-Magnesium-Vitamin D (CALCIUM MAGNESIUM PO), Coenzyme Q10, acyclovir ointment, nystatin cream, metroNIDAZOLE, nitroGLYCERIN, rosuvastatin, apixaban, metoprolol tartrate, levothyroxine, and alendronate.  No orders of the defined types were placed in this encounter.

## 2023-08-10 NOTE — Assessment & Plan Note (Signed)
Fall allergies- some improvement with zyrtec, add flonase daily

## 2023-08-10 NOTE — Assessment & Plan Note (Signed)
Discussed diet, exercise and weight loss. Mammo scheduled.  Declines covid vaccine, flu shot up to date. Recommend RSV at the pharmacy.

## 2023-08-10 NOTE — Assessment & Plan Note (Signed)
A1C at goal, Discussed dietary modification. Will request DM eye exam.

## 2023-08-11 LAB — COMPREHENSIVE METABOLIC PANEL
AG Ratio: 1.4 (calc) (ref 1.0–2.5)
ALT: 15 U/L (ref 6–29)
AST: 25 U/L (ref 10–35)
Albumin: 4.1 g/dL (ref 3.6–5.1)
Alkaline phosphatase (APISO): 64 U/L (ref 37–153)
BUN/Creatinine Ratio: 15 (calc) (ref 6–22)
BUN: 19 mg/dL (ref 7–25)
CO2: 26 mmol/L (ref 20–32)
Calcium: 9.5 mg/dL (ref 8.6–10.4)
Chloride: 103 mmol/L (ref 98–110)
Creat: 1.23 mg/dL — ABNORMAL HIGH (ref 0.60–0.95)
Globulin: 3 g/dL (ref 1.9–3.7)
Glucose, Bld: 98 mg/dL (ref 65–99)
Potassium: 4.1 mmol/L (ref 3.5–5.3)
Sodium: 138 mmol/L (ref 135–146)
Total Bilirubin: 0.8 mg/dL (ref 0.2–1.2)
Total Protein: 7.1 g/dL (ref 6.1–8.1)

## 2023-08-11 LAB — MICROALBUMIN / CREATININE URINE RATIO
Creatinine, Urine: 122 mg/dL (ref 20–275)
Microalb Creat Ratio: 61 mg/g{creat} — ABNORMAL HIGH (ref <30)
Microalb, Ur: 7.5 mg/dL

## 2023-08-11 LAB — HEMOGLOBIN A1C
Hgb A1c MFr Bld: 6.6 %{Hb} — ABNORMAL HIGH (ref <5.7)
Mean Plasma Glucose: 143 mg/dL
eAG (mmol/L): 7.9 mmol/L

## 2023-08-11 LAB — TSH: TSH: 2.43 m[IU]/L (ref 0.40–4.50)

## 2023-08-12 ENCOUNTER — Telehealth: Payer: Self-pay | Admitting: Family

## 2023-08-12 DIAGNOSIS — R809 Proteinuria, unspecified: Secondary | ICD-10-CM

## 2023-08-12 MED ORDER — LISINOPRIL 2.5 MG PO TABS: 2.5000 mg | ORAL_TABLET | Freq: Every day | ORAL | 0 refills | Status: DC

## 2023-08-12 NOTE — Telephone Encounter (Signed)
Please advise pt that her sugar is stable.  There is some protein in her urine though so I would like for her to add lisinopril 2.5mg  once daily to help protect her kidneys.  Also needs repeat bmet in 1 week.   Thyroid testing is normal.

## 2023-08-13 ENCOUNTER — Telehealth: Payer: Self-pay | Admitting: Family Medicine

## 2023-08-13 NOTE — Telephone Encounter (Signed)
Pt called and requested to speak with nurse or Melissa about her Lisinopril medication. She explained that she is already taking metoprolol for her bp due to a heart attack she had in the past. She is worried that if she takes Lisinopril, it will make her bp too low. Please call and advise pt if she should start the medication or not.

## 2023-08-13 NOTE — Telephone Encounter (Signed)
Patient notified of results, new medication and scheduled to come in next week for labs

## 2023-08-14 NOTE — Telephone Encounter (Signed)
Patient notified per pcp, the dose of lisinopril prescribed is not enough to affect her blood pressure.

## 2023-08-21 ENCOUNTER — Other Ambulatory Visit (INDEPENDENT_AMBULATORY_CARE_PROVIDER_SITE_OTHER): Payer: PPO

## 2023-08-21 DIAGNOSIS — R809 Proteinuria, unspecified: Secondary | ICD-10-CM | POA: Diagnosis not present

## 2023-08-22 DIAGNOSIS — R070 Pain in throat: Secondary | ICD-10-CM | POA: Diagnosis not present

## 2023-08-22 DIAGNOSIS — J069 Acute upper respiratory infection, unspecified: Secondary | ICD-10-CM | POA: Diagnosis not present

## 2023-08-22 DIAGNOSIS — R051 Acute cough: Secondary | ICD-10-CM | POA: Diagnosis not present

## 2023-08-22 DIAGNOSIS — R509 Fever, unspecified: Secondary | ICD-10-CM | POA: Diagnosis not present

## 2023-08-22 DIAGNOSIS — R0981 Nasal congestion: Secondary | ICD-10-CM | POA: Diagnosis not present

## 2023-08-22 LAB — BASIC METABOLIC PANEL
BUN: 16 mg/dL (ref 6–23)
CO2: 24 meq/L (ref 19–32)
Calcium: 9.1 mg/dL (ref 8.4–10.5)
Chloride: 103 meq/L (ref 96–112)
Creatinine, Ser: 1.18 mg/dL (ref 0.40–1.20)
GFR: 43.21 mL/min — ABNORMAL LOW (ref >60.00)
Glucose, Bld: 121 mg/dL — ABNORMAL HIGH (ref 70–99)
Potassium: 3.9 meq/L (ref 3.5–5.1)
Sodium: 137 meq/L (ref 135–145)

## 2023-08-30 DIAGNOSIS — J0100 Acute maxillary sinusitis, unspecified: Secondary | ICD-10-CM | POA: Diagnosis not present

## 2023-08-30 DIAGNOSIS — J209 Acute bronchitis, unspecified: Secondary | ICD-10-CM | POA: Diagnosis not present

## 2023-09-06 ENCOUNTER — Ambulatory Visit: Payer: PPO

## 2023-09-06 VITALS — Ht 61.0 in | Wt 209.0 lb

## 2023-09-06 DIAGNOSIS — Z0000 Encounter for general adult medical examination without abnormal findings: Secondary | ICD-10-CM

## 2023-09-06 NOTE — Progress Notes (Signed)
Subjective:   Wendy Munoz is a 82 y.o. female who presents for Medicare Annual (Subsequent) preventive examination.  Visit Complete: Virtual I connected with  Wendy Munoz on 09/06/23 by a audio enabled telemedicine application and verified that I am speaking with the correct person using two identifiers.  Patient Location: Home  Provider Location: Home Office  I discussed the limitations of evaluation and management by telemedicine. The patient expressed understanding and agreed to proceed.  Vital Signs: Because this visit was a virtual/telehealth visit, some criteria may be missing or patient reported. Any vitals not documented were not able to be obtained and vitals that have been documented are patient reported.  Patient Medicare AWV questionnaire was completed by the patient on 09/04/23; I have confirmed that all information answered by patient is correct and no changes since this date.  Cardiac Risk Factors include: advanced age (>63men, >36 women);diabetes mellitus;hypertension     Objective:    Today's Vitals   09/06/23 1512  Weight: 209 lb (94.8 kg)  Height: 5\' 1"  (1.549 m)   Body mass index is 39.49 kg/m.     09/06/2023    3:19 PM 09/01/2022    1:43 PM 08/30/2021    1:48 PM 07/04/2015    8:57 PM 04/15/2015   10:53 AM 02/03/2015   12:25 PM 12/02/2014    5:41 PM  Advanced Directives  Does Patient Have a Medical Advance Directive? No No No No No No No  Would patient like information on creating a medical advance directive? No - Patient declined No - Patient declined No - Patient declined No - patient declined information No - patient declined information No - patient declined information No - patient declined information    Current Medications (verified) Outpatient Encounter Medications as of 09/06/2023  Medication Sig   acyclovir ointment (ZOVIRAX) 5 % Apply small amount to affected 5 x daily x 4 days and as needed cold sore   alendronate (FOSAMAX) 70 MG  tablet Take 1 tablet (70 mg total) by mouth every 7 (seven) days. TAKE 1 TABLET BY MOUTH EVERY 7 DAYS.   apixaban (ELIQUIS) 5 MG TABS tablet Take 1 tablet (5 mg total) by mouth 2 (two) times daily.   Calcium-Magnesium-Vitamin D (CALCIUM MAGNESIUM PO) Take 1 capsule by mouth daily.   cetirizine (ZYRTEC) 10 MG tablet Take 10 mg by mouth at bedtime.   Cholecalciferol (VITAMIN D) 2000 UNITS tablet Take 6,000 Units by mouth daily.   Coenzyme Q10 200 MG TABS Take 200 mg by mouth daily.   levothyroxine (SYNTHROID) 88 MCG tablet TAKE 1 TABLET BY MOUTH EVERY DAY BEFORE BREAKFAST SKIP THE SATURDAY DOSE   lisinopril (ZESTRIL) 2.5 MG tablet Take 1 tablet (2.5 mg total) by mouth daily.   metoprolol tartrate (LOPRESSOR) 25 MG tablet Take 0.5 tablets (12.5 mg total) by mouth 2 (two) times daily.   metroNIDAZOLE (METROGEL) 1 % gel Apply topically daily.   nitroGLYCERIN (NITROSTAT) 0.4 MG SL tablet Place 1 tablet (0.4 mg total) under the tongue every 5 (five) minutes as needed for chest pain.   nystatin cream (MYCOSTATIN) Apply 1 application topically 2 (two) times daily as needed for dry skin (rash on abdomen).   rosuvastatin (CRESTOR) 40 MG tablet Take 0.5 tablets (20 mg total) by mouth daily.   No facility-administered encounter medications on file as of 09/06/2023.    Allergies (verified) Keflex [cephalexin]   History: Past Medical History:  Diagnosis Date   Allergic state 06/26/2017   Anemia  05/21/2017   Arthritis    Atrial tachycardia (HCC)    a. asymptomatic. Noted on tele during 11/2014 admission    Bilateral sacral insufficiency fracture 06/29/2021   CAD (coronary artery disease)    a. 11/2014 inf STEMI s/p DES to RCA; 50-70% ruptured plaque in pLAD- rx medically   GERD (gastroesophageal reflux disease)    Hematuria 02/18/2017   Hemorrhoids 05/11/2019   HTN (hypertension)    Hyperglycemia    Hyperlipidemia    Hypothyroidism    Insomnia 05/27/2017   Ischemic cardiomyopathy    a. 11/2014   LV gram with inferior hypokinesis. EF 45-50%.   Macrocytic anemia 05/21/2017   Nocturia 01/08/2017   Obesity 06/26/2017   On apixaban therapy 02/12/2019   Osteoporosis    PAF (paroxysmal atrial fibrillation) (HCC)    a. isolated episode of atrial fibrillation several years ago associated w/ her thyroid dysfunction (prior to ablation).     Prediabetes    Preventative health care 06/26/2017   Rosacea 06/15/2021   S/P knee replacement 04/03/2013   Thickened endometrium 11/01/2021   Urinary frequency 05/27/2017   Vasomotor rhinitis 06/26/2017   Past Surgical History:  Procedure Laterality Date   BREAST SURGERY     breast biospy   CATARACT EXTRACTION Bilateral    COLONOSCOPY     EYE SURGERY Bilateral 2005, 2006   Cataract with lens ImplaNT    INGUINAL HERNIA REPAIR Right    LEFT HEART CATHETERIZATION WITH CORONARY ANGIOGRAM N/A 12/02/2014   Procedure: LEFT HEART CATHETERIZATION WITH CORONARY ANGIOGRAM;  Surgeon: Lesleigh Noe, MD;  Location: [facility] CATH LAB;  Service: Cardiovascular;  Laterality: N/A;   ORIF HUMERUS FRACTURE Right 01/27/2014   Procedure: RIGHT OPEN REDUCTION INTERNAL FIXATION (ORIF) PROXIMAL HUMERUS FRACTURE;  Surgeon: Budd Palmer, MD;  Location: [facility] OR;  Service: Orthopedics;  Laterality: Right;   TOTAL KNEE ARTHROPLASTY Right 2006   TOTAL KNEE ARTHROPLASTY Left 2011   UMBILICAL HERNIA REPAIR     as a baby   Family History  Problem Relation Age of Onset   CAD Mother    Heart disease Mother    Heart failure Mother    Polycythemia Father    Multiple myeloma Father    Cancer Sister        breast cancer   Stroke Sister    Hyperlipidemia Sister    Hypertension Sister    Breast cancer Sister 66   Neuropathy Sister    Heart disease Brother        bradycardia   Hypertension Brother    Atrial fibrillation Daughter    Arthritis Maternal Aunt    Breast cancer Maternal Aunt    Lung cancer Paternal Grandmother    Prostate cancer Paternal Grandfather    Diabetes  Paternal Grandfather    Breast cancer Other    Social History   Socioeconomic History   Marital status: Married    Spouse name: Not on file   Number of children: Not on file   Years of education: Not on file   Highest education level: 12th grade  Occupational History   Occupation: retired   Tobacco Use   Smoking status: Never   Smokeless tobacco: Never  Vaping Use   Vaping status: Never Used  Substance and Sexual Activity   Alcohol use: No    Alcohol/week: 0.0 standard drinks of alcohol   Drug use: No   Sexual activity: Yes    Partners: Male  Other Topics Concern   Not on  file  Social History Narrative   Lives with husband   No pets   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    One daughter   Social Determinants of Health   Financial Resource Strain: Low Risk  (09/04/2023)   Overall Financial Resource Strain (CARDIA)    Difficulty of Paying Living Expenses: Not very hard  Food Insecurity: No Food Insecurity (09/04/2023)   Hunger Vital Sign    Worried About Running Out of Food in the Last Year: Never true    Ran Out of Food in the Last Year: Never true  Transportation Needs: No Transportation Needs (09/04/2023)   PRAPARE - Administrator, Civil Service (Medical): No    Lack of Transportation (Non-Medical): No  Physical Activity: Inactive (09/04/2023)   Exercise Vital Sign    Days of Exercise per Week: 0 days    Minutes of Exercise per Session: 0 min  Stress: No Stress Concern Present (09/04/2023)   [employer] of Occupational Health - Occupational Stress Questionnaire    Feeling of Stress : Not at all  Social Connections: Socially Integrated (09/04/2023)   Social Connection and Isolation Panel [NHANES]    Frequency of Communication with Friends and Family: Three times a week    Frequency of Social Gatherings with Friends and Family: Once a week    Attends Religious Services: More than 4 times per year    Active Member of [employer] or  Organizations: Yes    Attends Engineer, structural: More than 4 times per year    Marital Status: Married    Tobacco Counseling Counseling given: Not Answered   Clinical Intake:  Pre-visit preparation completed: Yes  Pain : No/denies pain     Nutritional Risks: None Diabetes: Yes CBG done?: No Did pt. bring in CBG monitor from home?: No  How often do you need to have someone help you when you read instructions, pamphlets, or other written materials from your doctor or pharmacy?: 2 - Rarely  Interpreter Needed?: No  Information entered by :: Theresa Mulligan LPN   Activities of Daily Living    09/04/2023   11:25 AM  In your present state of health, do you have any difficulty performing the following activities:  Hearing? 0  Vision? 0  Difficulty concentrating or making decisions? 0  Walking or climbing stairs? 0  Dressing or bathing? 0  Doing errands, shopping? 0  Preparing Food and eating ? N  Using the Toilet? N  In the past six months, have you accidently leaked urine? Y  Comment Wears a pad. Followed by PCP  Do you have problems with loss of bowel control? N  Managing your Medications? N  Managing your Finances? N  Housekeeping or managing your Housekeeping? N    Patient Care Team: Bradd Canary, MD as PCP - General (Family Medicine) Lyn Records, MD (Inactive) as PCP - Cardiology (Cardiology) Dannielle Huh, MD as Consulting Physician (Orthopedic Surgery) Myrene Galas, MD as Consulting Physician (Orthopedic Surgery) [facility], [facility]  Indicate any recent Medical Services you may have received from other than [facility] providers in the past year (date may be approximate).     Assessment:   This is a routine wellness examination for Yuritzi.  Hearing/Vision screen Hearing Screening - Comments:: Denies hearing difficulties   Vision Screening - Comments:: Wears rx glasses - up to date with routine eye exams with  Dr Burgess Estelle    Goals Addressed  This Visit's Progress     Increase physical activity (pt-stated)        Lose weight.       Depression Screen    09/06/2023    3:26 PM 08/10/2023    1:39 PM 02/06/2023    1:35 PM 11/22/2022   10:40 AM 09/01/2022    1:44 PM 03/16/2022    3:18 PM 08/30/2021    1:52 PM  PHQ 2/9 Scores  PHQ - 2 Score 0 0 0 0 0 0 0  PHQ- 9 Score 0 0 0        Fall Risk    09/06/2023    3:18 PM 09/04/2023   11:25 AM 08/10/2023    1:39 PM 02/06/2023    1:35 PM 11/22/2022   10:40 AM  Fall Risk   Falls in the past year? 0 0 0 0 0  Number falls in past yr: 0  0 0 0  Injury with Fall? 0  0 0 0  Risk for fall due to : No Fall Risks  No Fall Risks  No Fall Risks  Follow up Falls prevention discussed  Follow up appointment Falls evaluation completed Falls evaluation completed    MEDICARE RISK AT HOME: Medicare Risk at Home Any stairs in or around the home?: No If so, are there any without handrails?: No Home free of loose throw rugs in walkways, pet beds, electrical cords, etc?: Yes Adequate lighting in your home to reduce risk of falls?: Yes Life alert?: No Use of a cane, walker or w/c?: No Grab bars in the bathroom?: No Shower chair or bench in shower?: Yes Elevated toilet seat or a handicapped toilet?: Yes  TIMED UP AND GO:  Was the test performed?  No    Cognitive Function:        09/06/2023    3:19 PM 09/01/2022    1:51 PM  6CIT Screen  What Year? 0 points 0 points  What month? 0 points 0 points  What time? 0 points 0 points  Count back from 20 0 points 0 points  Months in reverse 0 points 0 points  Repeat phrase 0 points 0 points  Total Score 0 points 0 points    Immunizations Immunization History  Administered Date(s) Administered   Fluad Quad(high Dose 65+) 07/19/2019, 08/03/2020   Influenza, High Dose Seasonal PF 06/26/2017, 07/18/2018, 08/14/2021, 07/27/2023   Influenza-Unspecified 08/14/2021   Pneumococcal Conjugate-13 05/23/2014    Pneumococcal Polysaccharide-23 05/20/2019   Td 10/23/2009   Tdap 11/11/2021    TDAP status: Up to date  Flu Vaccine status: Up to date  Pneumococcal vaccine status: Up to date    Qualifies for Shingles Vaccine? Yes   Zostavax completed No   Shingrix Completed?: No.    Education has been provided regarding the importance of this vaccine. Patient has been advised to call insurance company to determine out of pocket expense if they have not yet received this vaccine. Advised may also receive vaccine at local pharmacy or Health Dept. Verbalized acceptance and understanding.  Screening Tests Health Maintenance  Topic Date Due   FOOT EXAM  Never done   OPHTHALMOLOGY EXAM  Never done   Zoster Vaccines- Shingrix (1 of 2) Never done   HEMOGLOBIN A1C  02/08/2024   Diabetic kidney evaluation - Urine ACR  08/09/2024   Diabetic kidney evaluation - eGFR measurement  08/20/2024   Medicare Annual Wellness (AWV)  09/05/2024   DTaP/Tdap/Td (3 - Td or Tdap) 11/12/2031   Pneumonia  Vaccine 41+ Years old  Completed   INFLUENZA VACCINE  Completed   DEXA SCAN  Completed   HPV VACCINES  Aged Out   COVID-19 Vaccine  Discontinued    Health Maintenance  Health Maintenance Due  Topic Date Due   FOOT EXAM  Never done   OPHTHALMOLOGY EXAM  Never done   Zoster Vaccines- Shingrix (1 of 2) Never done        Bone Density status: Completed 12/07/21. Results reflect: Bone density results: OSTEOPOROSIS. Repeat every   years.    Additional Screening:    Vision Screening: Recommended annual ophthalmology exams for early detection of glaucoma and other disorders of the eye. Is the patient up to date with their annual eye exam?  Yes  Who is the provider or what is the name of the office in which the patient attends annual eye exams? Dr Burgess Estelle If pt is not established with a provider, would they like to be referred to a provider to establish care? No .   Dental Screening: Recommended annual dental  exams for proper oral hygiene  Diabetic Foot Exam: Diabetic Foot Exam: Overdue, Pt has been advised about the importance in completing this exam. Pt is scheduled for diabetic foot exam on Deferred.  Community Resource Referral / Chronic Care Management:  CRR required this visit?  No   CCM required this visit?  No     Plan:     I have personally reviewed and noted the following in the patient's chart:   Medical and social history Use of alcohol, tobacco or illicit drugs  Current medications and supplements including opioid prescriptions. Patient is not currently taking opioid prescriptions. Functional ability and status Nutritional status Physical activity Advanced directives List of other physicians Hospitalizations, surgeries, and ER visits in previous 12 months Vitals Screenings to include cognitive, depression, and falls Referrals and appointments  In addition, I have reviewed and discussed with patient certain preventive protocols, quality metrics, and best practice recommendations. A written personalized care plan for preventive services as well as general preventive health recommendations were provided to patient.     Tillie Rung, LPN   [id number]   After Visit Summary: (MyChart) Due to this being a telephonic visit, the after visit summary with patients personalized plan was offered to patient via MyChart   Nurse Notes: None

## 2023-09-06 NOTE — Patient Instructions (Addendum)
Wendy Munoz , Thank you for taking time to come for your Medicare Wellness Visit. I appreciate your ongoing commitment to your health goals. Please review the following plan we discussed and let me know if I can assist you in the future.   Referrals/Orders/Follow-Ups/Clinician Recommendations:   This is a list of the screening recommended for you and due dates:  Health Maintenance  Topic Date Due   Complete foot exam   Never done   Eye exam for diabetics  Never done   Zoster (Shingles) Vaccine (1 of 2) Never done   Hemoglobin A1C  02/08/2024   Yearly kidney health urinalysis for diabetes  08/09/2024   Yearly kidney function blood test for diabetes  08/20/2024   Medicare Annual Wellness Visit  09/05/2024   DTaP/Tdap/Td vaccine (3 - Td or Tdap) 11/12/2031   Pneumonia Vaccine  Completed   Flu Shot  Completed   DEXA scan (bone density measurement)  Completed   HPV Vaccine  Aged Out   COVID-19 Vaccine  Discontinued    Advanced directives: (Declined) Advance directive discussed with you today. Even though you declined this today, please call our office should you change your mind, and we can give you the proper paperwork for you to fill out.  Next Medicare Annual Wellness Visit scheduled for next year: Yes

## 2023-09-28 ENCOUNTER — Other Ambulatory Visit: Payer: Self-pay | Admitting: Pharmacist

## 2023-09-28 ENCOUNTER — Other Ambulatory Visit: Payer: Self-pay | Admitting: Family Medicine

## 2023-09-28 DIAGNOSIS — E039 Hypothyroidism, unspecified: Secondary | ICD-10-CM

## 2023-09-28 DIAGNOSIS — E785 Hyperlipidemia, unspecified: Secondary | ICD-10-CM

## 2023-09-28 DIAGNOSIS — I10 Essential (primary) hypertension: Secondary | ICD-10-CM

## 2023-09-28 NOTE — Progress Notes (Signed)
09/28/2023 Name: Wendy Munoz MRN: [id number] DOB: August 09, 1941  Chief Complaint  Patient presents with   Medication Management    Eliquis PAP    Wendy Munoz is a 82 y.o. year old female who presented for a telephone visit.   They were referred to the pharmacist by  self  for assistance in managing medication access.    Subjective: Primary Care Provider: Bradd Canary, MD ; Next Scheduled Visit: 12/06/2023  Medication Access/Adherence  Current Pharmacy:  [id number] - [place], [place] - [address] [place] [address] Phone: [phone] Fax: [phone]   Patient reports affordability concerns with their medications: Yes  Patient reports access/transportation concerns to their pharmacy: No  Patient reports adherence concerns with their medications:  No     Patient is requesting assistance with cost of Eliquis. She is currently in Medicare coverage gap and cost of Eliquis is $144 per month. She has completed her portion of the medication assistance program for [employer]. She also have provided pharmacy printouts verifying that she has spend 3% of household income out of pocket for 2024.     Atrial Fibrillation:  Current medications: Rate Control: metoprolol tartrate 25mg  - take 0.5 tablet = 12.5mg  twice a day Rhythm Control:  n/a Anticoagulation Regimen: Eliquis 5mg  twice a day   Age = 82 yo; wt = 94.8kg; Scr = 1.18   CHA2DS2-VASc Score = 8   Current medication access support: none   Objective:  Lab Results  Component Value Date   HGBA1C 6.6 (H) 08/10/2023    Lab Results  Component Value Date   CREATININE 1.18 08/21/2023   BUN 16 08/21/2023   NA 137 08/21/2023   K 3.9 08/21/2023   CL 103 08/21/2023   CO2 24 08/21/2023    Lab Results  Component Value Date   CHOL 181 02/06/2023   HDL 84.20 02/06/2023   LDLCALC 78 02/06/2023   TRIG 94.0 02/06/2023   CHOLHDL 2 02/06/2023    Medications Reviewed  Today     Reviewed by Henrene Pastor, RPH-CPP (Pharmacist) on 09/28/23 at 1015  Med List Status: <None>   Medication Order Taking? Sig Documenting Provider Last Dose Status Informant  acyclovir ointment (ZOVIRAX) 5 % [id number] No Apply small amount to affected 5 x daily x 4 days and as needed cold sore  Patient not taking: Reported on 09/28/2023   Bradd Canary, MD Not Taking Active   alendronate (FOSAMAX) 70 MG tablet [id number] Yes Take 1 tablet (70 mg total) by mouth every 7 (seven) days. TAKE 1 TABLET BY MOUTH EVERY 7 DAYS. Ralene Cork, DO Taking Active   apixaban (ELIQUIS) 5 MG TABS tablet [id number] Yes Take 1 tablet (5 mg total) by mouth 2 (two) times daily. Revankar, Aundra Dubin, MD Taking Active   Calcium-Magnesium-Vitamin D (CALCIUM MAGNESIUM PO) [id number] Yes Take 1 capsule by mouth daily. [provider] Taking Active Self  cetirizine (ZYRTEC) 10 MG tablet [id number] Yes Take 10 mg by mouth at bedtime. [provider] Taking Active Self  Cholecalciferol (VITAMIN D) 2000 UNITS tablet [id number] Yes Take 6,000 Units by mouth daily. [provider] Taking Active Self  Coenzyme Q10 200 MG TABS [id number]  Take 200 mg by mouth daily. [provider]  Active   levothyroxine (SYNTHROID) 88 MCG tablet [id number] Yes TAKE 1 TABLET BY MOUTH EVERY DAY BEFORE BREAKFAST SKIP THE SATURDAY DOSE Bradd Canary, MD Taking Active   lisinopril (ZESTRIL)  2.5 MG tablet [id number] Yes Take 1 tablet (2.5 mg total) by mouth daily. Sandford Craze, NP Taking Active   metoprolol tartrate (LOPRESSOR) 25 MG tablet [id number] Yes Take 0.5 tablets (12.5 mg total) by mouth 2 (two) times daily. Revankar, Aundra Dubin, MD Taking Active   metroNIDAZOLE (METROGEL) 1 % gel [id number] No Apply topically daily.  Patient not taking: Reported on 09/28/2023   Bradd Canary, MD Not Taking Active   nitroGLYCERIN (NITROSTAT) 0.4 MG SL tablet [id number]  Place 1 tablet (0.4 mg total) under the tongue  every 5 (five) minutes as needed for chest pain. Lyn Records, MD  Active   nystatin cream Ambrose Pancoast) [id number]  Apply 1 application topically 2 (two) times daily as needed for dry skin (rash on abdomen). Bradd Canary, MD  Active   rosuvastatin (CRESTOR) 40 MG tablet [id number] Yes Take 0.5 tablets (20 mg total) by mouth daily. Bradd Canary, MD Taking Active               Assessment/Plan:   Atrial Fibrillation: - Currently controlled - Reviewed dose of Eliquis - 5mg  twice a day is correct dose based on patients age, weight and serum creatinine  - Reviewed application for Eliquis medication assistance program. Completed provider portion and will forward to PCP to review and sign.   Follow Up Plan: 10 to 14 days to check in patient assistance program application   Henrene Pastor, PharmD Clinical Pharmacist [facility]  Population Health [phone]

## 2023-10-01 ENCOUNTER — Telehealth: Payer: Self-pay | Admitting: Pharmacist

## 2023-10-01 DIAGNOSIS — I2510 Atherosclerotic heart disease of native coronary artery without angina pectoris: Secondary | ICD-10-CM

## 2023-10-01 DIAGNOSIS — I480 Paroxysmal atrial fibrillation: Secondary | ICD-10-CM

## 2023-10-01 MED ORDER — APIXABAN 5 MG PO TABS: 5.0000 mg | ORAL_TABLET | Freq: Two times a day (BID) | ORAL | Status: DC

## 2023-10-01 NOTE — Telephone Encounter (Signed)
Patient has enough Eliquis 5mg  to get her thru to 10/03/2023. She is in coverage gap and we are in the process of applying for medication assistance program.  Provided #28 = 14 days of ELiquis 5mg  samples - LOT [id number] / Exp: 10/2024

## 2023-10-05 ENCOUNTER — Other Ambulatory Visit: Payer: PPO | Admitting: Pharmacist

## 2023-10-08 ENCOUNTER — Telehealth: Payer: Self-pay | Admitting: Pharmacist

## 2023-10-08 NOTE — Telephone Encounter (Signed)
RECEIVED MESSAGE FROM PATIENT THAT SHE WAS APPROVED TO RECEIVE ELIQUIS FROM [facility] THRU 10/23/2023. SHE RECEIVED A 90 DS TODAY.

## 2023-10-10 ENCOUNTER — Telehealth: Payer: PPO

## 2023-10-14 DIAGNOSIS — J069 Acute upper respiratory infection, unspecified: Secondary | ICD-10-CM | POA: Diagnosis not present

## 2023-11-02 ENCOUNTER — Encounter: Payer: Self-pay | Admitting: Cardiology

## 2023-11-02 ENCOUNTER — Ambulatory Visit: Payer: PPO | Attending: Cardiology | Admitting: Cardiology

## 2023-11-02 VITALS — BP 118/60 | HR 70 | Ht 61.0 in | Wt 212.4 lb

## 2023-11-02 DIAGNOSIS — E118 Type 2 diabetes mellitus with unspecified complications: Secondary | ICD-10-CM

## 2023-11-02 DIAGNOSIS — E782 Mixed hyperlipidemia: Secondary | ICD-10-CM | POA: Diagnosis not present

## 2023-11-02 DIAGNOSIS — I480 Paroxysmal atrial fibrillation: Secondary | ICD-10-CM

## 2023-11-02 DIAGNOSIS — I2510 Atherosclerotic heart disease of native coronary artery without angina pectoris: Secondary | ICD-10-CM | POA: Diagnosis not present

## 2023-11-02 HISTORY — DX: Morbid (severe) obesity due to excess calories: E66.01

## 2023-11-02 MED ORDER — NITROGLYCERIN 0.4 MG SL SUBL: 0.4000 mg | SUBLINGUAL_TABLET | SUBLINGUAL | 11 refills | Status: AC | PRN

## 2023-11-02 NOTE — Progress Notes (Signed)
 Cardiology Office Note:    Date:  11/02/2023   ID:  Wendy Munoz, DOB 1941-10-06, MRN [id number]  PCP:  Domenica Harlene LABOR, MD  Cardiologist:  Jennifer JONELLE Crape, MD   Referring MD: Domenica Harlene LABOR, MD    ASSESSMENT:    1. Coronary artery disease involving native coronary artery of native heart without angina pectoris   2. PAF (paroxysmal atrial fibrillation) (HCC)   3. Controlled type 2 diabetes mellitus with complication, without long-term current use of insulin (HCC)   4. Mixed hyperlipidemia   5. Morbid obesity (HCC)    PLAN:    In order of problems listed above:  Coronary artery disease: Secondary prevention stressed with the patient.  Importance of compliance with diet medications stressed and she vocalized understanding.  She ambulates with a cane. Essential hypertension: Blood pressure stable and diet was emphasized.  She has borderline blood pressure.  She is feeling much better as her beta-blocker was reduced to half dose.  Because of borderline blood pressure she does not take lisinopril . Mixed dyslipidemia: On lipid-lowering medications followed by primary care.  Results discussed with the patient. Diabetes mellitus and morbid obesity: Weight reduction stressed diet emphasized.  Risks of obesity explained.  Patient promises to do better. Patient will be seen in follow-up appointment in 12 months or earlier if the patient has any concerns.    Medication Adjustments/Labs and Tests Ordered: Current medicines are reviewed at length with the patient today.  Concerns regarding medicines are outlined above.  No orders of the defined types were placed in this encounter.  No orders of the defined types were placed in this encounter.    No chief complaint on file.    History of Present Illness:    Wendy Munoz is a 83 y.o. female.  Patient has past medical history of coronary artery disease, paroxysmal atrial fibrillation, diabetes mellitus, mixed dyslipidemia and morbid  obesity.  She denies any problems at this time and takes care of activities of daily living.  No chest pain orthopnea or PND.  At the time of my evaluation, the patient is alert awake oriented and in no distress.  She ambulates with a cane.  Past Medical History:  Diagnosis Date   Allergic state 06/26/2017   Allergy 06/26/2017   IMO SNOMED Dx Update Oct 2024     Anemia 05/21/2017   Arthritis    Atrial tachycardia (HCC)    a. asymptomatic. Noted on tele during 11/2014 admission    Bilateral sacral insufficiency fracture 06/29/2021   CAD (coronary artery disease)    a. 11/2014 inf STEMI s/p DES to RCA; 50-70% ruptured plaque in pLAD- rx medically   Controlled type 2 diabetes mellitus with complication, without long-term current use of insulin (HCC) 08/10/2023   GERD (gastroesophageal reflux disease)    Hematuria 02/18/2017   Hemorrhoids 05/11/2019   HTN (hypertension)    Hyperglycemia    Hyperlipidemia    Hypothyroidism    Insomnia 05/27/2017   Ischemic cardiomyopathy    a. 11/2014  LV gram with inferior hypokinesis. EF 45-50%.   Macrocytic anemia 05/21/2017   Nocturia 01/08/2017   Obesity 06/26/2017   On apixaban  therapy 02/12/2019   Osteoporosis    PAF (paroxysmal atrial fibrillation) (HCC)    a. isolated episode of atrial fibrillation several years ago associated w/ her thyroid  dysfunction (prior to ablation).     Preventative health care 06/26/2017   Rosacea 06/15/2021   S/P knee replacement 04/03/2013   Thickened endometrium  11/01/2021   Urinary frequency 05/27/2017   Vasomotor rhinitis 06/26/2017    Past Surgical History:  Procedure Laterality Date   BREAST SURGERY     breast biospy   CATARACT EXTRACTION Bilateral    COLONOSCOPY     EYE SURGERY Bilateral 2005, 2006   Cataract with lens ImplaNT    INGUINAL HERNIA REPAIR Right    LEFT HEART CATHETERIZATION WITH CORONARY ANGIOGRAM N/A 12/02/2014   Procedure: LEFT HEART CATHETERIZATION WITH CORONARY ANGIOGRAM;  Surgeon:  Victory LELON Claudene DOUGLAS, MD;  Location: [facility] CATH LAB;  Service: Cardiovascular;  Laterality: N/A;   ORIF HUMERUS FRACTURE Right 01/27/2014   Procedure: RIGHT OPEN REDUCTION INTERNAL FIXATION (ORIF) PROXIMAL HUMERUS FRACTURE;  Surgeon: Ozell VEAR Bruch, MD;  Location: [facility] OR;  Service: Orthopedics;  Laterality: Right;   TOTAL KNEE ARTHROPLASTY Right 2006   TOTAL KNEE ARTHROPLASTY Left 2011   UMBILICAL HERNIA REPAIR     as a baby    Current Medications: Current Meds  Medication Sig   alendronate  (FOSAMAX ) 70 MG tablet Take 1 tablet (70 mg total) by mouth every 7 (seven) days. TAKE 1 TABLET BY MOUTH EVERY 7 DAYS.   apixaban  (ELIQUIS ) 5 MG TABS tablet Take 1 tablet (5 mg total) by mouth 2 (two) times daily.   Calcium -Magnesium-Vitamin D  (CALCIUM  MAGNESIUM PO) Take 1 capsule by mouth daily.   cetirizine (ZYRTEC) 10 MG tablet Take 10 mg by mouth at bedtime.   Cholecalciferol (VITAMIN D ) 2000 UNITS tablet Take 6,000 Units by mouth daily.   Coenzyme Q10 200 MG TABS Take 200 mg by mouth daily.   levothyroxine  (SYNTHROID ) 88 MCG tablet TAKE 1 TABLET BY MOUTH EVERY DAY BEFORE BREAKFAST SKIP THE SATURDAY DOSE   lisinopril  (ZESTRIL ) 2.5 MG tablet Take 1 tablet (2.5 mg total) by mouth daily.   metoprolol  tartrate (LOPRESSOR ) 25 MG tablet Take 0.5 tablets (12.5 mg total) by mouth 2 (two) times daily.   metroNIDAZOLE  (METROGEL ) 1 % gel Apply topically daily.   nitroGLYCERIN  (NITROSTAT ) 0.4 MG SL tablet Place 1 tablet (0.4 mg total) under the tongue every 5 (five) minutes as needed for chest pain.   rosuvastatin  (CRESTOR ) 40 MG tablet Take 0.5 tablets (20 mg total) by mouth daily.     Allergies:   Keflex [cephalexin]   Social History   Socioeconomic History   Marital status: Married    Spouse name: Not on file   Number of children: Not on file   Years of education: Not on file   Highest education level: 12th grade  Occupational History   Occupation: retired   Tobacco Use   Smoking status: Never    Smokeless tobacco: Never  Vaping Use   Vaping status: Never Used  Substance and Sexual Activity   Alcohol  use: No    Alcohol /week: 0.0 standard drinks of alcohol    Drug use: No   Sexual activity: Yes    Partners: Male  Other Topics Concern   Not on file  Social History Narrative   Lives with husband   No pets   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    One daughter   Social Drivers of Corporate Investment Banker Strain: Low Risk  (09/04/2023)   Overall Financial Resource Strain (CARDIA)    Difficulty of Paying Living Expenses: Not very hard  Food Insecurity: No Food Insecurity (09/04/2023)   Hunger Vital Sign    Worried About Running Out of Food in the Last Year: Never true    Ran  Out of Food in the Last Year: Never true  Transportation Needs: No Transportation Needs (09/04/2023)   PRAPARE - Administrator, Civil Service (Medical): No    Lack of Transportation (Non-Medical): No  Physical Activity: Inactive (09/04/2023)   Exercise Vital Sign    Days of Exercise per Week: 0 days    Minutes of Exercise per Session: 0 min  Stress: No Stress Concern Present (09/04/2023)   [employer] of Occupational Health - Occupational Stress Questionnaire    Feeling of Stress : Not at all  Social Connections: Socially Integrated (09/04/2023)   Social Connection and Isolation Panel [NHANES]    Frequency of Communication with Friends and Family: Three times a week    Frequency of Social Gatherings with Friends and Family: Once a week    Attends Religious Services: More than 4 times per year    Active Member of [employer] or Organizations: Yes    Attends Engineer, Structural: More than 4 times per year    Marital Status: Married     Family History: The patient's family history includes Arthritis in her maternal aunt; Atrial fibrillation in her daughter; Breast cancer in her maternal aunt and another family member; Breast cancer (age of onset: 7) in her  sister; CAD in her mother; Cancer in her sister; Diabetes in her paternal grandfather; Heart disease in her brother and mother; Heart failure in her mother; Hyperlipidemia in her sister; Hypertension in her brother and sister; Lung cancer in her paternal grandmother; Multiple myeloma in her father; Neuropathy in her sister; Polycythemia in her father; Prostate cancer in her paternal grandfather; Stroke in her sister.  ROS:   Please see the history of present illness.    All other systems reviewed and are negative.  EKGs/Labs/Other Studies Reviewed:    The following studies were reviewed today: I discussed my findings with the patient at length   Recent Labs: 05/08/2023: Hemoglobin 11.3; Platelets 179.0 08/10/2023: ALT 15; TSH 2.43 08/21/2023: BUN 16; Creatinine, Ser 1.18; Potassium 3.9; Sodium 137  Recent Lipid Panel    Component Value Date/Time   CHOL 181 02/06/2023 1438   TRIG 94.0 02/06/2023 1438   HDL 84.20 02/06/2023 1438   CHOLHDL 2 02/06/2023 1438   VLDL 18.8 02/06/2023 1438   LDLCALC 78 02/06/2023 1438   LDLCALC 85 08/04/2022 1435    Physical Exam:    VS:  BP 118/60   Pulse 70   Ht 5' 1 (1.549 m)   Wt 212 lb 6.4 oz (96.3 kg)   SpO2 95%   BMI 40.13 kg/m     Wt Readings from Last 3 Encounters:  11/02/23 212 lb 6.4 oz (96.3 kg)  09/06/23 209 lb (94.8 kg)  08/10/23 218 lb (98.9 kg)     GEN: Patient is in no acute distress HEENT: Normal NECK: No JVD; No carotid bruits LYMPHATICS: No lymphadenopathy CARDIAC: Hear sounds regular, 2/6 systolic murmur at the apex. RESPIRATORY:  Clear to auscultation without rales, wheezing or rhonchi  ABDOMEN: Soft, non-tender, non-distended MUSCULOSKELETAL:  No edema; No deformity  SKIN: Warm and dry NEUROLOGIC:  Alert and oriented x 3 PSYCHIATRIC:  Normal affect   Signed, Jennifer JONELLE Crape, MD  11/02/2023 1:09 PM    [facility]

## 2023-11-02 NOTE — Patient Instructions (Signed)

## 2023-11-10 ENCOUNTER — Other Ambulatory Visit: Payer: Self-pay | Admitting: Family

## 2023-11-11 ENCOUNTER — Other Ambulatory Visit: Payer: Self-pay | Admitting: Cardiology

## 2023-12-03 ENCOUNTER — Other Ambulatory Visit: Payer: Self-pay | Admitting: Family Medicine

## 2023-12-05 NOTE — Assessment & Plan Note (Signed)
Encouraged good sleep hygiene such as dark, quiet room. No blue/green glowing lights such as computer screens in bedroom. No alcohol or stimulants in evening. Cut down on caffeine as able. Regular exercise is helpful but not just prior to bed time.

## 2023-12-05 NOTE — Assessment & Plan Note (Signed)
hgba1c acceptable, minimize simple carbs. Increase exercise as tolerated. Continue current meds

## 2023-12-05 NOTE — Assessment & Plan Note (Signed)
On Levothyroxine, continue to monitor

## 2023-12-05 NOTE — Assessment & Plan Note (Signed)
Well controlled, no changes to meds. Encouraged heart healthy diet such as the DASH diet and exercise as tolerated.

## 2023-12-05 NOTE — Assessment & Plan Note (Addendum)
Encourage heart healthy diet such as MIND or DASH diet, increase exercise, avoid trans fats, simple carbohydrates and processed foods, consider a krill or fish or flaxseed oil cap daily. Tolerating Rosuvastatin

## 2023-12-06 ENCOUNTER — Encounter: Payer: Self-pay | Admitting: Family Medicine

## 2023-12-06 ENCOUNTER — Ambulatory Visit: Payer: PPO | Admitting: Family Medicine

## 2023-12-06 VITALS — BP 128/74 | HR 68 | Temp 97.6°F | Resp 16 | Ht 61.0 in | Wt 213.4 lb

## 2023-12-06 DIAGNOSIS — I10 Essential (primary) hypertension: Secondary | ICD-10-CM | POA: Diagnosis not present

## 2023-12-06 DIAGNOSIS — Z7984 Long term (current) use of oral hypoglycemic drugs: Secondary | ICD-10-CM

## 2023-12-06 DIAGNOSIS — D539 Nutritional anemia, unspecified: Secondary | ICD-10-CM

## 2023-12-06 DIAGNOSIS — E785 Hyperlipidemia, unspecified: Secondary | ICD-10-CM

## 2023-12-06 DIAGNOSIS — G4700 Insomnia, unspecified: Secondary | ICD-10-CM | POA: Diagnosis not present

## 2023-12-06 DIAGNOSIS — M810 Age-related osteoporosis without current pathological fracture: Secondary | ICD-10-CM | POA: Diagnosis not present

## 2023-12-06 DIAGNOSIS — E039 Hypothyroidism, unspecified: Secondary | ICD-10-CM | POA: Diagnosis not present

## 2023-12-06 DIAGNOSIS — E118 Type 2 diabetes mellitus with unspecified complications: Secondary | ICD-10-CM | POA: Diagnosis not present

## 2023-12-06 LAB — CBC WITH DIFFERENTIAL/PLATELET
Basophils Absolute: 0 10*3/uL (ref 0.0–0.1)
Basophils Relative: 0.6 % (ref 0.0–3.0)
Eosinophils Absolute: 0.1 10*3/uL (ref 0.0–0.7)
Eosinophils Relative: 1.9 % (ref 0.0–5.0)
HCT: 36.5 % (ref 36.0–46.0)
Hemoglobin: 12.2 g/dL (ref 12.0–15.0)
Lymphocytes Relative: 19.6 % (ref 12.0–46.0)
Lymphs Abs: 1.1 10*3/uL (ref 0.7–4.0)
MCHC: 33.4 g/dL (ref 30.0–36.0)
MCV: 97.7 fL (ref 78.0–100.0)
Monocytes Absolute: 0.7 10*3/uL (ref 0.1–1.0)
Monocytes Relative: 13.1 % — ABNORMAL HIGH (ref 3.0–12.0)
Neutro Abs: 3.6 10*3/uL (ref 1.4–7.7)
Neutrophils Relative %: 64.8 % (ref 43.0–77.0)
Platelets: 201 10*3/uL (ref 150.0–400.0)
RBC: 3.73 Mil/uL — ABNORMAL LOW (ref 3.87–5.11)
RDW: 13.5 % (ref 11.5–15.5)
WBC: 5.6 10*3/uL (ref 4.0–10.5)

## 2023-12-06 LAB — COMPREHENSIVE METABOLIC PANEL
ALT: 16 U/L (ref 0–35)
AST: 25 U/L (ref 0–37)
Albumin: 4.2 g/dL (ref 3.5–5.2)
Alkaline Phosphatase: 56 U/L (ref 39–117)
BUN: 18 mg/dL (ref 6–23)
CO2: 28 meq/L (ref 19–32)
Calcium: 9.4 mg/dL (ref 8.4–10.5)
Chloride: 103 meq/L (ref 96–112)
Creatinine, Ser: 1.08 mg/dL (ref 0.40–1.20)
GFR: 47.96 mL/min — ABNORMAL LOW (ref >60.00)
Glucose, Bld: 108 mg/dL — ABNORMAL HIGH (ref 70–99)
Potassium: 4 meq/L (ref 3.5–5.1)
Sodium: 140 meq/L (ref 135–145)
Total Bilirubin: 0.8 mg/dL (ref 0.2–1.2)
Total Protein: 6.9 g/dL (ref 6.0–8.3)

## 2023-12-06 LAB — TSH: TSH: 1.76 u[IU]/mL (ref 0.35–5.50)

## 2023-12-06 LAB — MICROALBUMIN / CREATININE URINE RATIO
Creatinine,U: 128.9 mg/dL
Microalb Creat Ratio: 60.6 mg/g — ABNORMAL HIGH (ref 0.0–30.0)
Microalb, Ur: 7.8 mg/dL — ABNORMAL HIGH (ref 0.0–1.9)

## 2023-12-06 LAB — LIPID PANEL
Cholesterol: 196 mg/dL (ref 0–200)
HDL: 92.4 mg/dL (ref >39.00)
LDL Cholesterol: 83 mg/dL (ref 0–99)
NonHDL: 103.56
Total CHOL/HDL Ratio: 2
Triglycerides: 105 mg/dL (ref 0.0–149.0)
VLDL: 21 mg/dL (ref 0.0–40.0)

## 2023-12-06 LAB — VITAMIN B12: Vitamin B-12: 320 pg/mL (ref 211–911)

## 2023-12-06 LAB — HEMOGLOBIN A1C: Hgb A1c MFr Bld: 6.6 % — ABNORMAL HIGH (ref 4.6–6.5)

## 2023-12-06 LAB — VITAMIN D 25 HYDROXY (VIT D DEFICIENCY, FRACTURES): VITD: 51.33 ng/mL (ref 30.00–100.00)

## 2023-12-06 NOTE — Patient Instructions (Signed)

## 2023-12-07 ENCOUNTER — Other Ambulatory Visit: Payer: Self-pay

## 2023-12-07 ENCOUNTER — Encounter: Payer: Self-pay | Admitting: Family Medicine

## 2023-12-07 DIAGNOSIS — R809 Proteinuria, unspecified: Secondary | ICD-10-CM

## 2023-12-07 MED ORDER — ENALAPRIL MALEATE 5 MG PO TABS: 5.0000 mg | ORAL_TABLET | Freq: Every day | ORAL | 3 refills | Status: DC

## 2023-12-07 NOTE — Progress Notes (Signed)
Subjective:    Patient ID: Wendy Munoz, female    DOB: August 28, 1941, 83 y.o.   MRN: [id number]  Chief Complaint  Patient presents with  . Follow-up    3 month    HPI Discussed the use of AI scribe software for clinical note transcription with the patient, who gave verbal consent to proceed.  History of Present Illness   Jacky, an 83 year old woman with a history of hypertension, diabetes, and osteoporosis, presents with increasing fatigue, aches, and stiffness. She attributes these symptoms to aging. She reports that her fatigue has improved somewhat since her metoprolol dosage was halved. She has been prescribed lisinopril for kidney protection, but she stopped taking it due to concerns about a cough and low blood pressure. She is unsure if the cough was a side effect of the lisinopril or due to a cold she had around the same time. She also stopped taking her morning dose of metoprolol and took the lisinopril instead, which did not negatively affect her blood pressure. She has not taken lisinopril for several months.  Lorette also expresses concerns about her weight and belly fat. She has been trying to cut down on junk food and eat more protein. She has been eating a lot of pecans and has been trying to walk more, although she reports that walking long distances can be difficult due to fractures in her back and pain in her right leg and hip. She has a 83 year old knee implant in her right knee, which she thinks may need to be x-rayed.        Past Medical History:  Diagnosis Date  . Allergic state 06/26/2017  . Allergy 06/26/2017   IMO SNOMED Dx Update Oct 2024    . Anemia 05/21/2017  . Arthritis   . Atrial tachycardia (HCC)    a. asymptomatic. Noted on tele during 11/2014 admission   . Bilateral sacral insufficiency fracture 06/29/2021  . CAD (coronary artery disease)    a. 11/2014 inf STEMI s/p DES to RCA; 50-70% ruptured plaque in pLAD- rx medically  . Controlled type 2 diabetes  mellitus with complication, without long-term current use of insulin (HCC) 08/10/2023  . GERD (gastroesophageal reflux disease)   . Hematuria 02/18/2017  . Hemorrhoids 05/11/2019  . HTN (hypertension)   . Hyperglycemia   . Hyperlipidemia   . Hypothyroidism   . Insomnia 05/27/2017  . Ischemic cardiomyopathy    a. 11/2014  LV gram with inferior hypokinesis. EF 45-50%.  . Macrocytic anemia 05/21/2017  . Nocturia 01/08/2017  . Obesity 06/26/2017  . On apixaban therapy 02/12/2019  . Osteoporosis   . PAF (paroxysmal atrial fibrillation) (HCC)    a. isolated episode of atrial fibrillation several years ago associated w/ her thyroid dysfunction (prior to ablation).    . Preventative health care 06/26/2017  . Rosacea 06/15/2021  . S/P knee replacement 04/03/2013  . Thickened endometrium 11/01/2021  . Urinary frequency 05/27/2017  . Vasomotor rhinitis 06/26/2017    Past Surgical History:  Procedure Laterality Date  . BREAST SURGERY     breast biospy  . CATARACT EXTRACTION Bilateral   . COLONOSCOPY    . EYE SURGERY Bilateral 2005, 2006   Cataract with lens ImplaNT   . INGUINAL HERNIA REPAIR Right   . LEFT HEART CATHETERIZATION WITH CORONARY ANGIOGRAM N/A 12/02/2014   Procedure: LEFT HEART CATHETERIZATION WITH CORONARY ANGIOGRAM;  Surgeon: Lesleigh Noe, MD;  Location: [facility] CATH LAB;  Service: Cardiovascular;  Laterality: N/A;  . ORIF  HUMERUS FRACTURE Right 01/27/2014   Procedure: RIGHT OPEN REDUCTION INTERNAL FIXATION (ORIF) PROXIMAL HUMERUS FRACTURE;  Surgeon: Budd Palmer, MD;  Location: [facility] OR;  Service: Orthopedics;  Laterality: Right;  . TOTAL KNEE ARTHROPLASTY Right 2006  . TOTAL KNEE ARTHROPLASTY Left 2011  . UMBILICAL HERNIA REPAIR     as a baby    Family History  Problem Relation Age of Onset  . CAD Mother   . Heart disease Mother   . Heart failure Mother   . Polycythemia Father   . Multiple myeloma Father   . Cancer Sister        breast cancer  . Stroke Sister    . Hyperlipidemia Sister   . Hypertension Sister   . Breast cancer Sister 31  . Neuropathy Sister   . Heart disease Brother        bradycardia  . Hypertension Brother   . Atrial fibrillation Daughter   . Arthritis Maternal Aunt   . Breast cancer Maternal Aunt   . Lung cancer Paternal Grandmother   . Prostate cancer Paternal Grandfather   . Diabetes Paternal Grandfather   . Breast cancer Other     Social History   Socioeconomic History  . Marital status: Married    Spouse name: Not on file  . Number of children: Not on file  . Years of education: Not on file  . Highest education level: 12th grade  Occupational History  . Occupation: retired   Tobacco Use  . Smoking status: Never  . Smokeless tobacco: Never  Vaping Use  . Vaping status: Never Used  Substance and Sexual Activity  . Alcohol use: No    Alcohol/week: 0.0 standard drinks of alcohol  . Drug use: No  . Sexual activity: Yes    Partners: Male  Other Topics Concern  . 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 Difficulty of Paying Living Expenses: Somewhat hard  Food Insecurity: No Food Insecurity (12/05/2023)   Hunger Vital Sign   . Worried About Programme researcher, broadcasting/film/video in the Last Year: Never true   . Ran Out of Food in the Last Year: Never true  Transportation Needs: No Transportation Needs (12/05/2023)   PRAPARE - Transportation   . Lack of Transportation (Medical): No   . Lack of Transportation (Non-Medical): No  Physical Activity: Inactive (12/05/2023)   Exercise Vital Sign   . Days of Exercise per Week: 0 days   . Minutes of Exercise per Session: 0 min  Stress: No Stress Concern Present (12/05/2023)   [employer] of Occupational Health - Occupational Stress Questionnaire    . Feeling of Stress : Not at all  Social Connections: Socially Integrated (12/05/2023)   Social Connection and Isolation Panel [NHANES]   . Frequency of Communication with Friends and Family: Three times a week   . Frequency of Social Gatherings with Friends and Family: Once a week   . Attends Religious Services: More than 4 times per year   . Active Member of Clubs or Organizations: No   . Attends Banker Meetings: More than 4 times per year   . Marital Status: Married  Catering manager Violence: Not At Risk (09/06/2023)   Humiliation, Afraid,  Rape, and Kick questionnaire   . Fear of Current or Ex-Partner: No   . Emotionally Abused: No   . Physically Abused: No   . Sexually Abused: No    Outpatient Medications Prior to Visit  Medication Sig Dispense Refill  . alendronate (FOSAMAX) 70 MG tablet Take 1 tablet (70 mg total) by mouth every 7 (seven) days. TAKE 1 TABLET BY MOUTH EVERY 7 DAYS. 12 tablet 3  . apixaban (ELIQUIS) 5 MG TABS tablet Take 1 tablet (5 mg total) by mouth 2 (two) times daily.    . Calcium-Magnesium-Vitamin D (CALCIUM MAGNESIUM PO) Take 1 capsule by mouth daily.    . cetirizine (ZYRTEC) 10 MG tablet Take 10 mg by mouth at bedtime.    . Cholecalciferol (VITAMIN D) 2000 UNITS tablet Take 6,000 Units by mouth daily.    . Coenzyme Q10 200 MG TABS Take 200 mg by mouth daily.    Marland Kitchen levothyroxine (SYNTHROID) 88 MCG tablet TAKE 1 TABLET BY MOUTH EVERY DAY BEFORE BREAKFAST SKIP THE SATURDAY DOSE 90 tablet 1  . metoprolol tartrate (LOPRESSOR) 25 MG tablet Take 0.5 tablets (12.5 mg total) by mouth 2 (two) times daily. 90 tablet 3  . metroNIDAZOLE (METROGEL) 1 % gel Apply topically daily. 45 g 2  . nitroGLYCERIN (NITROSTAT) 0.4 MG SL tablet Place 1 tablet (0.4 mg total) under the tongue every 5 (five) minutes as needed for chest pain. 25 tablet 11  . rosuvastatin (CRESTOR) 40 MG tablet Take 0.5 tablets (20 mg total) by mouth daily. 90 tablet 1  . lisinopril (ZESTRIL)  2.5 MG tablet TAKE 1 TABLET BY MOUTH EVERY DAY 90 tablet 0   No facility-administered medications prior to visit.    Allergies  Allergen Reactions  . Keflex [Cephalexin] Rash    Review of Systems  Constitutional:  Negative for fever and malaise/fatigue.  HENT:  Negative for congestion.   Eyes:  Negative for blurred vision.  Respiratory:  Negative for shortness of breath.   Cardiovascular:  Negative for chest pain, palpitations and leg swelling.  Gastrointestinal:  Negative for abdominal pain, blood in stool and nausea.  Genitourinary:  Negative for dysuria and frequency.  Musculoskeletal:  Negative for falls.  Skin:  Negative for rash.  Neurological:  Negative for dizziness, loss of consciousness and headaches.  Endo/Heme/Allergies:  Negative for environmental allergies.  Psychiatric/Behavioral:  Negative for depression. The patient is not nervous/anxious.       Objective:    Physical Exam Constitutional:      General: She is not in acute distress.    Appearance: Normal appearance. She is well-developed. She is obese. She is not ill-appearing or toxic-appearing.  HENT:     Head: Normocephalic and atraumatic.     Right Ear: External ear normal.     Left Ear: External ear normal.     Nose: Nose normal.  Eyes:     General:        Right eye: No discharge.        Left eye: No discharge.     Conjunctiva/sclera: Conjunctivae normal.  Neck:     Thyroid: No thyromegaly.  Cardiovascular:     Rate and Rhythm: Normal rate and regular rhythm.     Heart sounds: Normal heart sounds. No murmur heard. Pulmonary:     Effort: Pulmonary effort is normal. No respiratory distress.     Breath sounds: Normal breath sounds.  Abdominal:     General: Bowel sounds are normal.     Palpations: Abdomen  is soft.     Tenderness: There is no abdominal tenderness. There is no guarding.  Musculoskeletal:        General: Normal range of motion.     Cervical back: Neck supple.  Lymphadenopathy:      Cervical: No cervical adenopathy.  Skin:    General: Skin is warm and dry.  Neurological:     Mental Status: She is alert and oriented to person, place, and time.  Psychiatric:        Mood and Affect: Mood normal.        Behavior: Behavior normal.        Thought Content: Thought content normal.        Judgment: Judgment normal.   BP 128/74 (BP Location: Left Arm, Patient Position: Sitting, Cuff Size: Large)   Pulse 68   Temp 97.6 F (36.4 C) (Oral)   Resp 16   Ht 5\' 1"  (1.549 m)   Wt 213 lb 6.4 oz (96.8 kg)   SpO2 97%   BMI 40.32 kg/m  Wt Readings from Last 3 Encounters:  12/06/23 213 lb 6.4 oz (96.8 kg)  11/02/23 212 lb 6.4 oz (96.3 kg)  09/06/23 209 lb (94.8 kg)    Diabetic Foot Exam - Simple   No data filed    Lab Results  Component Value Date   WBC 5.6 12/06/2023   HGB 12.2 12/06/2023   HCT 36.5 12/06/2023   PLT 201.0 12/06/2023   GLUCOSE 108 (H) 12/06/2023   CHOL 196 12/06/2023   TRIG 105.0 12/06/2023   HDL 92.40 12/06/2023   LDLCALC 83 12/06/2023   ALT 16 12/06/2023   AST 25 12/06/2023   NA 140 12/06/2023   K 4.0 12/06/2023   CL 103 12/06/2023   CREATININE 1.08 12/06/2023   BUN 18 12/06/2023   CO2 28 12/06/2023   TSH 1.76 12/06/2023   INR 1.05 01/27/2014   HGBA1C 6.6 (H) 12/06/2023   MICROALBUR 7.8 (H) 12/06/2023    Lab Results  Component Value Date   TSH 1.76 12/06/2023   Lab Results  Component Value Date   WBC 5.6 12/06/2023   HGB 12.2 12/06/2023   HCT 36.5 12/06/2023   MCV 97.7 12/06/2023   PLT 201.0 12/06/2023   Lab Results  Component Value Date   NA 140 12/06/2023   K 4.0 12/06/2023   CO2 28 12/06/2023   GLUCOSE 108 (H) 12/06/2023   BUN 18 12/06/2023   CREATININE 1.08 12/06/2023   BILITOT 0.8 12/06/2023   ALKPHOS 56 12/06/2023   AST 25 12/06/2023   ALT 16 12/06/2023   PROT 6.9 12/06/2023   ALBUMIN 4.2 12/06/2023   CALCIUM 9.4 12/06/2023   ANIONGAP 7 04/15/2015   GFR 47.96 (L) 12/06/2023   Lab Results  Component Value  Date   CHOL 196 12/06/2023   Lab Results  Component Value Date   HDL 92.40 12/06/2023   Lab Results  Component Value Date   LDLCALC 83 12/06/2023   Lab Results  Component Value Date   TRIG 105.0 12/06/2023   Lab Results  Component Value Date   CHOLHDL 2 12/06/2023   Lab Results  Component Value Date   HGBA1C 6.6 (H) 12/06/2023       Assessment & Plan:  Controlled type 2 diabetes mellitus with complication, without long-term current use of insulin (HCC) Assessment & Plan: hgba1c acceptable, minimize simple carbs. Increase exercise as tolerated. Continue current meds   Orders: -     Hemoglobin A1c -  Microalbumin / creatinine urine ratio  Primary hypertension Assessment & Plan: Well controlled, no changes to meds. Encouraged heart healthy diet such as the DASH diet and exercise as tolerated.   Orders: -     Comprehensive metabolic panel -     CBC with Differential/Platelet  Hypothyroidism, unspecified type Assessment & Plan: On Levothyroxine, continue to monitor  Orders: -     TSH  Hyperlipidemia, unspecified hyperlipidemia type Assessment & Plan: Encourage heart healthy diet such as MIND or DASH diet, increase exercise, avoid trans fats, simple carbohydrates and processed foods, consider a krill or fish or flaxseed oil cap daily. Tolerating Rosuvastatin  Orders: -     Lipid panel  Insomnia, unspecified type Assessment & Plan: Encouraged good sleep hygiene such as dark, quiet room. No blue/green glowing lights such as computer screens in bedroom. No alcohol or stimulants in evening. Cut down on caffeine as able. Regular exercise is helpful but not just prior to bed time.    Osteoporosis, unspecified osteoporosis type, unspecified pathological fracture presence -     VITAMIN D 25 Hydroxy (Vit-D Deficiency, Fractures)  Macrocytic anemia -     Vitamin B12    Assessment and Plan    Hypertension Improved fatigue with reduced Metoprolol dose.  Lisinopril was prescribed for renal protection but discontinued due to concerns of cough and hypotension. -Resume Lisinopril 2.5mg  daily, taking it with the evening meal to avoid simultaneous dosing with Metoprolol. -Continue Metoprolol 12.5mg  twice daily.  Diabetes Mellitus Last A1c was 6.6. Proteinuria present indicating early renal damage. Patient hesitant to start new medications and prefers dietary control. -Discussed potential benefits of Ozempic for weight loss and renal protection. Patient to consider. -Encouraged dietary modifications including increased protein intake, reduced carbohydrate intake, and pairing carbohydrates with proteins. -Check A1c, renal function, and Vitamin D levels today.  Osteoporosis Patient due for bone density scan in two weeks. Currently taking Vitamin D3 5000 units, Calcium 600mg , and Magnesium 300mg  daily. -Continue current regimen. -Consider adding collagen supplement.  Chronic Back Pain and Right Hip Pain Pain has improved recently. Patient has history of vertebral fractures and knee implants. -Plan to see sports medicine specialist after bone density scan. -Consider knee x-ray if symptoms persist.  General Health Maintenance -Continue B12 supplement. -Consider multivitamin. -Encouraged regular physical activity as tolerated. -Follow-up in 4 months or sooner if needed.         Danise Edge, MD

## 2023-12-10 NOTE — Telephone Encounter (Signed)
Called and spoke with patient. She didn't see how you wanted her to take the Enalapril 5 mg tablet. It wasn't in her AVS. This was all it said.Marland KitchenMarland Kitchen"Resume Lisinopril 2.5mg  daily, taking it with the evening meal to avoid simultaneous dosing with Metoprolol. -Continue Metoprolol 12.5mg  twice daily.." but at the end of the AVS, it had the lisinopril as discontinued. I let her know I would get clarification from the provider and let her know

## 2023-12-10 NOTE — Telephone Encounter (Signed)
Called and spoke with patient. She is going to continue taking the Lisinopril.

## 2023-12-19 DIAGNOSIS — M8580 Other specified disorders of bone density and structure, unspecified site: Secondary | ICD-10-CM | POA: Diagnosis not present

## 2023-12-19 DIAGNOSIS — Z1231 Encounter for screening mammogram for malignant neoplasm of breast: Secondary | ICD-10-CM | POA: Diagnosis not present

## 2023-12-19 DIAGNOSIS — R29890 Loss of height: Secondary | ICD-10-CM | POA: Diagnosis not present

## 2023-12-19 DIAGNOSIS — Z8262 Family history of osteoporosis: Secondary | ICD-10-CM | POA: Diagnosis not present

## 2023-12-19 LAB — HM MAMMOGRAPHY

## 2023-12-19 LAB — HM DEXA SCAN

## 2023-12-20 ENCOUNTER — Encounter: Payer: Self-pay | Admitting: Family Medicine

## 2024-01-01 ENCOUNTER — Telehealth: Payer: Self-pay

## 2024-01-01 NOTE — Telephone Encounter (Signed)
 PAP: Patient assistance application for Eliquis through [employer] ([employer]MS) has been mailed to pt's home address on file. Provider portion of application will be faxed to provider's office.

## 2024-01-04 NOTE — Telephone Encounter (Signed)
 Received Provider Portion of [employer] Eliquis application- waiting on OOP document and Patient to return APP.

## 2024-01-15 DIAGNOSIS — H04123 Dry eye syndrome of bilateral lacrimal glands: Secondary | ICD-10-CM | POA: Diagnosis not present

## 2024-01-15 DIAGNOSIS — H26491 Other secondary cataract, right eye: Secondary | ICD-10-CM | POA: Diagnosis not present

## 2024-01-15 DIAGNOSIS — H52203 Unspecified astigmatism, bilateral: Secondary | ICD-10-CM | POA: Diagnosis not present

## 2024-01-15 DIAGNOSIS — H43813 Vitreous degeneration, bilateral: Secondary | ICD-10-CM | POA: Diagnosis not present

## 2024-01-15 LAB — HM DIABETES EYE EXAM

## 2024-02-15 DIAGNOSIS — R051 Acute cough: Secondary | ICD-10-CM | POA: Diagnosis not present

## 2024-02-15 DIAGNOSIS — R0981 Nasal congestion: Secondary | ICD-10-CM | POA: Diagnosis not present

## 2024-02-15 DIAGNOSIS — R070 Pain in throat: Secondary | ICD-10-CM | POA: Diagnosis not present

## 2024-02-20 DIAGNOSIS — R0981 Nasal congestion: Secondary | ICD-10-CM | POA: Diagnosis not present

## 2024-02-20 DIAGNOSIS — R059 Cough, unspecified: Secondary | ICD-10-CM | POA: Diagnosis not present

## 2024-02-28 ENCOUNTER — Other Ambulatory Visit: Payer: Self-pay | Admitting: Family Medicine

## 2024-04-07 ENCOUNTER — Other Ambulatory Visit: Payer: Self-pay | Admitting: Cardiology

## 2024-04-07 DIAGNOSIS — I480 Paroxysmal atrial fibrillation: Secondary | ICD-10-CM

## 2024-04-07 DIAGNOSIS — I2510 Atherosclerotic heart disease of native coronary artery without angina pectoris: Secondary | ICD-10-CM

## 2024-04-08 NOTE — Telephone Encounter (Signed)
 Prescription refill request for Eliquis  received. Indication:afib Last office visit:1/25 Scr:1.08  2/25 Age: 83 Weight:96.8  kg  Prescription refilled

## 2024-04-16 NOTE — Telephone Encounter (Signed)
 Reached out to patient on status of OOP statements and She said she is good with the co-payment at this time and does not need assistance for now. Can follow up later in the year.

## 2024-04-27 NOTE — Assessment & Plan Note (Signed)
 Encouraged DASH or MIND diet, decrease po intake and increase exercise as tolerated. Needs 7-8 hours of sleep nightly. Avoid trans fats, eat small, frequent meals every 4-5 hours with lean proteins, complex carbs and healthy fats. Minimize simple carbs, high fat foods and processed foods

## 2024-04-27 NOTE — Assessment & Plan Note (Signed)
 On Levothyroxine, continue to monitor

## 2024-04-27 NOTE — Assessment & Plan Note (Signed)
 Encouraged to get adequate exercise, calcium and vitamin d intake

## 2024-04-27 NOTE — Assessment & Plan Note (Signed)
 Encourage heart healthy diet such as MIND or DASH diet, increase exercise, avoid trans fats, simple carbohydrates and processed foods, consider a krill or fish or flaxseed oil cap daily. Tolerating Rosuvastatin

## 2024-04-27 NOTE — Assessment & Plan Note (Signed)
 Well controlled, no changes to meds. Encouraged heart healthy diet such as the DASH diet and exercise as tolerated.

## 2024-04-27 NOTE — Assessment & Plan Note (Signed)
 hgba1c acceptable, minimize simple carbs. Increase exercise as tolerated. Continue current meds

## 2024-05-01 ENCOUNTER — Ambulatory Visit (INDEPENDENT_AMBULATORY_CARE_PROVIDER_SITE_OTHER): Payer: PPO | Admitting: Family Medicine

## 2024-05-01 ENCOUNTER — Encounter: Payer: Self-pay | Admitting: Family Medicine

## 2024-05-01 VITALS — BP 124/74 | HR 66 | Resp 16 | Ht 61.0 in | Wt 207.0 lb

## 2024-05-01 DIAGNOSIS — I10 Essential (primary) hypertension: Secondary | ICD-10-CM | POA: Diagnosis not present

## 2024-05-01 DIAGNOSIS — M8000XD Age-related osteoporosis with current pathological fracture, unspecified site, subsequent encounter for fracture with routine healing: Secondary | ICD-10-CM

## 2024-05-01 DIAGNOSIS — E118 Type 2 diabetes mellitus with unspecified complications: Secondary | ICD-10-CM | POA: Diagnosis not present

## 2024-05-01 DIAGNOSIS — E039 Hypothyroidism, unspecified: Secondary | ICD-10-CM | POA: Diagnosis not present

## 2024-05-01 DIAGNOSIS — E785 Hyperlipidemia, unspecified: Secondary | ICD-10-CM

## 2024-05-01 DIAGNOSIS — E669 Obesity, unspecified: Secondary | ICD-10-CM | POA: Diagnosis not present

## 2024-05-01 DIAGNOSIS — R809 Proteinuria, unspecified: Secondary | ICD-10-CM

## 2024-05-01 MED ORDER — LISINOPRIL 2.5 MG PO TABS: 2.5000 mg | ORAL_TABLET | Freq: Every day | ORAL | 1 refills | Status: AC

## 2024-05-01 MED ORDER — LISINOPRIL 2.5 MG PO TABS: 2.5000 mg | ORAL_TABLET | Freq: Every day | ORAL | 1 refills | Status: DC

## 2024-05-01 MED ORDER — ALENDRONATE SODIUM 70 MG PO TABS: 70.0000 mg | ORAL_TABLET | ORAL | 3 refills | Status: AC

## 2024-05-01 MED ORDER — ROSUVASTATIN CALCIUM 20 MG PO TABS: 20.0000 mg | ORAL_TABLET | Freq: Every day | ORAL | 1 refills | Status: DC

## 2024-05-01 NOTE — Patient Instructions (Signed)
 Acute Back Pain, Adult Acute back pain is sudden and usually short-lived. It is often caused by an injury to the muscles and tissues in the back. The injury may result from: A muscle, tendon, or ligament getting overstretched or torn. Ligaments are tissues that connect bones to each other. Lifting something improperly can cause a back strain. Wear and tear (degeneration) of the spinal disks. Spinal disks are circular tissue that provide cushioning between the bones of the spine (vertebrae). Twisting motions, such as while playing sports or doing yard work. A hit to the back. Arthritis. You may have a physical exam, lab tests, and imaging tests to find the cause of your pain. Acute back pain usually goes away with rest and home care. Follow these instructions at home: Managing pain, stiffness, and swelling Take over-the-counter and prescription medicines only as told by your health care provider. Treatment may include medicines for pain and inflammation that are taken by mouth or applied to the skin, or muscle relaxants. Your health care provider may recommend applying ice during the first 24-48 hours after your pain starts. To do this: Put ice in a plastic bag. Place a towel between your skin and the bag. Leave the ice on for 20 minutes, 2-3 times a day. Remove the ice if your skin turns bright red. This is very important. If you cannot feel pain, heat, or cold, you have a greater risk of damage to the area. If directed, apply heat to the affected area as often as told by your health care provider. Use the heat source that your health care provider recommends, such as a moist heat pack or a heating pad. Place a towel between your skin and the heat source. Leave the heat on for 20-30 minutes. Remove the heat if your skin turns bright red. This is especially important if you are unable to feel pain, heat, or cold. You have a greater risk of getting burned. Activity  Do not stay in bed. Staying in  bed for more than 1-2 days can delay your recovery. Sit up and stand up straight. Avoid leaning forward when you sit or hunching over when you stand. If you work at a desk, sit close to it so you do not need to lean over. Keep your chin tucked in. Keep your neck drawn back, and keep your elbows bent at a 90-degree angle (right angle). Sit high and close to the steering wheel when you drive. Add lower back (lumbar) support to your car seat, if needed. Take short walks on even surfaces as soon as you are able. Try to increase the length of time you walk each day. Do not sit, drive, or stand in one place for more than 30 minutes at a time. Sitting or standing for long periods of time can put stress on your back. Do not drive or use heavy machinery while taking prescription pain medicine. Use proper lifting techniques. When you bend and lift, use positions that put less stress on your back: [place] your knees. Keep the load close to your body. Avoid twisting. Exercise regularly as told by your health care provider. Exercising helps your back heal faster and helps prevent back injuries by keeping muscles strong and flexible. Work with a physical therapist to make a safe exercise program, as recommended by your health care provider. Do any exercises as told by your physical therapist. Lifestyle Maintain a healthy weight. Extra weight puts stress on your back and makes it difficult to have good  posture. Avoid activities or situations that make you feel anxious or stressed. Stress and anxiety increase muscle tension and can make back pain worse. Learn ways to manage anxiety and stress, such as through exercise. General instructions Sleep on a firm mattress in a comfortable position. Try lying on your side with your knees slightly bent. If you lie on your back, put a pillow under your knees. Keep your head and neck in a straight line with your spine (neutral position) when using electronic equipment like  smartphones or pads. To do this: Raise your smartphone or pad to look at it instead of bending your head or neck to look down. Put the smartphone or pad at the level of your face while looking at the screen. Follow your treatment plan as told by your health care provider. This may include: Cognitive or behavioral therapy. Acupuncture or massage therapy. Meditation or yoga. Contact a health care provider if: You have pain that is not relieved with rest or medicine. You have increasing pain going down into your legs or buttocks. Your pain does not improve after 2 weeks. You have pain at night. You lose weight without trying. You have a fever or chills. You develop nausea or vomiting. You develop abdominal pain. Get help right away if: You develop new bowel or bladder control problems. You have unusual weakness or numbness in your arms or legs. You feel faint. These symptoms may represent a serious problem that is an emergency. Do not wait to see if the symptoms will go away. Get medical help right away. Call your local emergency services (911 in the [place] Do not drive yourself to the hospital. Summary Acute back pain is sudden and usually short-lived. Use proper lifting techniques. When you bend and lift, use positions that put less stress on your back. Take over-the-counter and prescription medicines only as told by your health care provider, and apply heat or ice as told. This information is not intended to replace advice given to you by your health care provider. Make sure you discuss any questions you have with your health care provider. Document Revised: 12/31/2020 Document Reviewed: 12/31/2020 Elsevier Patient Education  2024 [employer].

## 2024-05-02 ENCOUNTER — Ambulatory Visit: Payer: Self-pay | Admitting: Family Medicine

## 2024-05-02 ENCOUNTER — Encounter: Payer: Self-pay | Admitting: Family Medicine

## 2024-05-02 DIAGNOSIS — E785 Hyperlipidemia, unspecified: Secondary | ICD-10-CM

## 2024-05-02 LAB — CBC WITH DIFFERENTIAL/PLATELET
Basophils Absolute: 0.1 K/uL (ref 0.0–0.1)
Basophils Relative: 1.3 % (ref 0.0–3.0)
Eosinophils Absolute: 0.1 K/uL (ref 0.0–0.7)
Eosinophils Relative: 2.2 % (ref 0.0–5.0)
HCT: 36.3 % (ref 36.0–46.0)
Hemoglobin: 12.2 g/dL (ref 12.0–15.0)
Lymphocytes Relative: 19.7 % (ref 12.0–46.0)
Lymphs Abs: 1.1 K/uL (ref 0.7–4.0)
MCHC: 33.5 g/dL (ref 30.0–36.0)
MCV: 97.2 fl (ref 78.0–100.0)
Monocytes Absolute: 0.7 K/uL (ref 0.1–1.0)
Monocytes Relative: 11.5 % (ref 3.0–12.0)
Neutro Abs: 3.7 K/uL (ref 1.4–7.7)
Neutrophils Relative %: 65.3 % (ref 43.0–77.0)
Platelets: 203 K/uL (ref 150.0–400.0)
RBC: 3.74 Mil/uL — ABNORMAL LOW (ref 3.87–5.11)
RDW: 14.3 % (ref 11.5–15.5)
WBC: 5.7 K/uL (ref 4.0–10.5)

## 2024-05-02 LAB — LIPID PANEL
Cholesterol: 216 mg/dL — ABNORMAL HIGH (ref 0–200)
HDL: 105 mg/dL (ref >39.00)
LDL Cholesterol: 96 mg/dL (ref 0–99)
NonHDL: 110.55
Total CHOL/HDL Ratio: 2
Triglycerides: 75 mg/dL (ref 0.0–149.0)
VLDL: 15 mg/dL (ref 0.0–40.0)

## 2024-05-02 LAB — COMPREHENSIVE METABOLIC PANEL WITH GFR
ALT: 18 U/L (ref 0–35)
AST: 26 U/L (ref 0–37)
Albumin: 4.5 g/dL (ref 3.5–5.2)
Alkaline Phosphatase: 54 U/L (ref 39–117)
BUN: 22 mg/dL (ref 6–23)
CO2: 29 meq/L (ref 19–32)
Calcium: 9.4 mg/dL (ref 8.4–10.5)
Chloride: 101 meq/L (ref 96–112)
Creatinine, Ser: 1.1 mg/dL (ref 0.40–1.20)
GFR: 46.78 mL/min — ABNORMAL LOW (ref >60.00)
Glucose, Bld: 98 mg/dL (ref 70–99)
Potassium: 4 meq/L (ref 3.5–5.1)
Sodium: 138 meq/L (ref 135–145)
Total Bilirubin: 0.8 mg/dL (ref 0.2–1.2)
Total Protein: 7.4 g/dL (ref 6.0–8.3)

## 2024-05-02 LAB — TSH: TSH: 0.53 u[IU]/mL (ref 0.35–5.50)

## 2024-05-02 LAB — HEMOGLOBIN A1C: Hgb A1c MFr Bld: 6.6 % — ABNORMAL HIGH (ref 4.6–6.5)

## 2024-05-02 MED ORDER — ROSUVASTATIN CALCIUM 40 MG PO TABS: 40.0000 mg | ORAL_TABLET | Freq: Every day | ORAL | 3 refills | Status: DC

## 2024-05-02 NOTE — Telephone Encounter (Signed)
-----   Message from Harlene Horton sent at 05/02/2024 12:11 PM EDT ----- Labs stable no new concerns, no changes except cholesterol has gone up. Need to increase Rosuvastatin  to 40 mg tabs, 1 tab po daily disp #30 with 5 rf and recheck labs in 3 months ----- Message ----- From: Interface, Lab In Three Zero One Sent: 05/02/2024  10:25 AM EDT To: Harlene DELENA Horton, MD

## 2024-05-02 NOTE — Progress Notes (Signed)
 Subjective:    Patient ID: Wendy Munoz, female    DOB: 1941-03-29, 83 y.o.   MRN: [id number]  Chief Complaint  Patient presents with   Medical Management of Chronic Issues    Patient presents today for a 5 month follow-up.   Quality Metric Gaps    Foot exam, zoster    HPI Discussed the use of AI scribe software for clinical note transcription with the patient, who gave verbal consent to proceed.  History of Present Illness Wendy Munoz is an 83 year old female with hypertension and osteoporosis who presents for medication management and follow-up.  She has been taking lisinopril  2.5 mg daily for blood pressure. She also takes metoprolol  tartrate 12.5 mg twice daily, split from a 25 mg tablet. She has a history of heart attack. She currently has a large supply of the tartrate form of metoprolol .  She recently switched to rosuvastatin  20 mg for cholesterol management. She has been taking alendronate  since 2022 for osteoporosis, with a recent bone density scan in February. She continues to take calcium  and vitamin D  supplements regularly and has resumed taking collagen.  She has a history of a compression fracture in her back and hip fractures in 2022, which sometimes cause discomfort and limit her walking. She is actively managing her weight and diet, reporting a weight of 207 pounds at the clinic, down from 201 pounds at home. She is attempting to drink more water and reduce sweets in her diet. She experiences fatigue and some discomfort from her previous fractures.  She was seen in the ER on June 5th due to an illness that took about two months to resolve, which she attributes to a cold contracted from a church member.    Past Medical History:  Diagnosis Date   Allergic state 06/26/2017   Allergy 06/26/2017   IMO SNOMED Dx Update Oct 2024     Anemia 05/21/2017   Arthritis    Atrial tachycardia (HCC)    a. asymptomatic. Noted on tele during 11/2014 admission    Bilateral  sacral insufficiency fracture 06/29/2021   CAD (coronary artery disease)    a. 11/2014 inf STEMI s/p DES to RCA; 50-70% ruptured plaque in pLAD- rx medically   Controlled type 2 diabetes mellitus with complication, without long-term current use of insulin (HCC) 08/10/2023   GERD (gastroesophageal reflux disease)    Hematuria 02/18/2017   Hemorrhoids 05/11/2019   HTN (hypertension)    Hyperglycemia    Hyperlipidemia    Hypothyroidism    Insomnia 05/27/2017   Ischemic cardiomyopathy    a. 11/2014  LV gram with inferior hypokinesis. EF 45-50%.   Macrocytic anemia 05/21/2017   Nocturia 01/08/2017   Obesity 06/26/2017   On apixaban  therapy 02/12/2019   Osteoporosis    PAF (paroxysmal atrial fibrillation) (HCC)    a. isolated episode of atrial fibrillation several years ago associated w/ her thyroid  dysfunction (prior to ablation).     Preventative health care 06/26/2017   Rosacea 06/15/2021   S/P knee replacement 04/03/2013   Thickened endometrium 11/01/2021   Urinary frequency 05/27/2017   Vasomotor rhinitis 06/26/2017    Past Surgical History:  Procedure Laterality Date   BREAST SURGERY     breast biospy   CATARACT EXTRACTION Bilateral    COLONOSCOPY     EYE SURGERY Bilateral 2005, 2006   Cataract with lens ImplaNT    INGUINAL HERNIA REPAIR Right    LEFT HEART CATHETERIZATION WITH CORONARY ANGIOGRAM N/A 12/02/2014  Procedure: LEFT HEART CATHETERIZATION WITH CORONARY ANGIOGRAM;  Surgeon: Victory LELON Claudene DOUGLAS, MD;  Location: [facility] CATH LAB;  Service: Cardiovascular;  Laterality: N/A;   ORIF HUMERUS FRACTURE Right 01/27/2014   Procedure: RIGHT OPEN REDUCTION INTERNAL FIXATION (ORIF) PROXIMAL HUMERUS FRACTURE;  Surgeon: Ozell VEAR Bruch, MD;  Location: [facility] OR;  Service: Orthopedics;  Laterality: Right;   TOTAL KNEE ARTHROPLASTY Right 2006   TOTAL KNEE ARTHROPLASTY Left 2011   UMBILICAL HERNIA REPAIR     as a baby    Family History  Problem Relation Age of Onset   CAD Mother    Heart  disease Mother    Heart failure Mother    Polycythemia Father    Multiple myeloma Father    Cancer Sister        breast cancer   Stroke Sister    Hyperlipidemia Sister    Hypertension Sister    Breast cancer Sister 64   Neuropathy Sister    Heart disease Brother        bradycardia   Hypertension Brother    Atrial fibrillation Daughter    Arthritis Maternal Aunt    Breast cancer Maternal Aunt    Lung cancer Paternal Grandmother    Prostate cancer Paternal Grandfather    Diabetes Paternal Grandfather    Breast cancer Other     Social History   Socioeconomic History   Marital status: Married    Spouse name: Not on file   Number of children: Not on file   Years of education: Not on file   Highest education level: 12th grade  Occupational History   Occupation: retired   Tobacco Use   Smoking status: Never   Smokeless tobacco: Never  Vaping Use   Vaping status: Never Used  Substance and Sexual Activity   Alcohol  use: No    Alcohol /week: 0.0 standard drinks of alcohol    Drug use: No   Sexual activity: Yes    Partners: Male  Other Topics Concern   Not on file  Social History Narrative   Lives with husband   No pets   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    One daughter   Social Drivers of Corporate investment banker Strain: Low Risk  (04/29/2024)   Overall Financial Resource Strain (CARDIA)    Difficulty of Paying Living Expenses: Not hard at all  Food Insecurity: No Food Insecurity (04/29/2024)   Hunger Vital Sign    Worried About Running Out of Food in the Last Year: Never true    Ran Out of Food in the Last Year: Never true  Transportation Needs: No Transportation Needs (04/29/2024)   PRAPARE - Administrator, Civil Service (Medical): No    Lack of Transportation (Non-Medical): No  Physical Activity: Inactive (04/29/2024)   Exercise Vital Sign    Days of Exercise per Week: 0 days    Minutes of Exercise per Session: Not on file   Stress: No Stress Concern Present (04/29/2024)   [employer] of Occupational Health - Occupational Stress Questionnaire    Feeling of Stress: Only a little  Social Connections: Moderately Integrated (04/29/2024)   Social Connection and Isolation Panel    Frequency of Communication with Friends and Family: Three times a week    Frequency of Social Gatherings with Friends and Family: Once a week    Attends Religious Services: More than 4 times per year    Active Member of Clubs or Organizations: No  Attends Banker Meetings: Not on file    Marital Status: Married  Intimate Partner Violence: Not At Risk (09/06/2023)   Humiliation, Afraid, Rape, and Kick questionnaire    Fear of Current or Ex-Partner: No    Emotionally Abused: No    Physically Abused: No    Sexually Abused: No    Outpatient Medications Prior to Visit  Medication Sig Dispense Refill   Calcium -Magnesium-Vitamin D  (CALCIUM  MAGNESIUM PO) Take 1 capsule by mouth daily.     cetirizine (ZYRTEC) 10 MG tablet Take 10 mg by mouth at bedtime.     Cholecalciferol (VITAMIN D ) 2000 UNITS tablet Take 6,000 Units by mouth daily.     Coenzyme Q10 200 MG TABS Take 200 mg by mouth daily.     ELIQUIS  5 MG TABS tablet TAKE 1 TABLET BY MOUTH TWICE A DAY 180 tablet 3   levothyroxine  (SYNTHROID ) 88 MCG tablet TAKE 1 TABLET BY MOUTH EVERY DAY BEFORE BREAKFAST SKIP THE SATURDAY DOSE 90 tablet 1   metoprolol  tartrate (LOPRESSOR ) 25 MG tablet Take 0.5 tablets (12.5 mg total) by mouth 2 (two) times daily. 90 tablet 3   metroNIDAZOLE  (METROGEL ) 1 % gel Apply topically daily. 45 g 2   nitroGLYCERIN  (NITROSTAT ) 0.4 MG SL tablet Place 1 tablet (0.4 mg total) under the tongue every 5 (five) minutes as needed for chest pain. 25 tablet 11   alendronate  (FOSAMAX ) 70 MG tablet Take 1 tablet (70 mg total) by mouth every 7 (seven) days. TAKE 1 TABLET BY MOUTH EVERY 7 DAYS. 12 tablet 3   lisinopril  (ZESTRIL ) 2.5 MG tablet Take 2.5 mg by mouth  daily.     rosuvastatin  (CRESTOR ) 20 MG tablet Take 20 mg by mouth daily.     enalapril  (VASOTEC ) 5 MG tablet Take 1 tablet (5 mg total) by mouth daily. 30 tablet 3   rosuvastatin  (CRESTOR ) 40 MG tablet Take 0.5 tablets (20 mg total) by mouth daily. 45 tablet 1   No facility-administered medications prior to visit.    Allergies  Allergen Reactions   Keflex [Cephalexin] Rash    Review of Systems  Constitutional:  Negative for fever and malaise/fatigue.  HENT:  Negative for congestion.   Eyes:  Negative for blurred vision.  Respiratory:  Negative for shortness of breath.   Cardiovascular:  Negative for chest pain, palpitations and leg swelling.  Gastrointestinal:  Negative for abdominal pain, blood in stool and nausea.  Genitourinary:  Negative for dysuria and frequency.  Musculoskeletal:  Positive for back pain and joint pain. Negative for falls.  Skin:  Negative for rash.  Neurological:  Negative for dizziness, loss of consciousness and headaches.  Endo/Heme/Allergies:  Negative for environmental allergies.  Psychiatric/Behavioral:  Negative for depression. The patient is not nervous/anxious.        Objective:    Physical Exam Constitutional:      General: She is not in acute distress.    Appearance: Normal appearance. She is well-developed. She is not toxic-appearing.  HENT:     Head: Normocephalic and atraumatic.     Right Ear: External ear normal.     Left Ear: External ear normal.     Nose: Nose normal.  Eyes:     General:        Right eye: No discharge.        Left eye: No discharge.     Conjunctiva/sclera: Conjunctivae normal.  Neck:     Thyroid : No thyromegaly.  Cardiovascular:     Rate and  Rhythm: Normal rate and regular rhythm.     Heart sounds: Normal heart sounds. No murmur heard. Pulmonary:     Effort: Pulmonary effort is normal. No respiratory distress.     Breath sounds: Normal breath sounds.  Abdominal:     General: Bowel sounds are normal.      Palpations: Abdomen is soft.     Tenderness: There is no abdominal tenderness. There is no guarding.  Musculoskeletal:        General: Normal range of motion.     Cervical back: Neck supple.  Lymphadenopathy:     Cervical: No cervical adenopathy.  Skin:    General: Skin is warm and dry.  Neurological:     Mental Status: She is alert and oriented to person, place, and time.  Psychiatric:        Mood and Affect: Mood normal.        Behavior: Behavior normal.        Thought Content: Thought content normal.        Judgment: Judgment normal.     BP 124/74   Pulse 66   Resp 16   Ht 5' 1 (1.549 m)   Wt 207 lb (93.9 kg)   SpO2 96%   BMI 39.11 kg/m  Wt Readings from Last 3 Encounters:  05/01/24 207 lb (93.9 kg)  12/06/23 213 lb 6.4 oz (96.8 kg)  11/02/23 212 lb 6.4 oz (96.3 kg)    Diabetic Foot Exam - Simple   No data filed    Lab Results  Component Value Date   WBC 5.7 05/01/2024   HGB 12.2 05/01/2024   HCT 36.3 05/01/2024   PLT 203.0 05/01/2024   GLUCOSE 98 05/01/2024   CHOL 216 (H) 05/01/2024   TRIG 75.0 05/01/2024   HDL 105.00 05/01/2024   LDLCALC 96 05/01/2024   ALT 18 05/01/2024   AST 26 05/01/2024   NA 138 05/01/2024   K 4.0 05/01/2024   CL 101 05/01/2024   CREATININE 1.10 05/01/2024   BUN 22 05/01/2024   CO2 29 05/01/2024   TSH 0.53 05/01/2024   INR 1.05 01/27/2014   HGBA1C 6.6 (H) 05/01/2024   MICROALBUR 7.8 (H) 12/06/2023    Lab Results  Component Value Date   TSH 0.53 05/01/2024   Lab Results  Component Value Date   WBC 5.7 05/01/2024   HGB 12.2 05/01/2024   HCT 36.3 05/01/2024   MCV 97.2 05/01/2024   PLT 203.0 05/01/2024   Lab Results  Component Value Date   NA 138 05/01/2024   K 4.0 05/01/2024   CO2 29 05/01/2024   GLUCOSE 98 05/01/2024   BUN 22 05/01/2024   CREATININE 1.10 05/01/2024   BILITOT 0.8 05/01/2024   ALKPHOS 54 05/01/2024   AST 26 05/01/2024   ALT 18 05/01/2024   PROT 7.4 05/01/2024   ALBUMIN 4.5 05/01/2024    CALCIUM  9.4 05/01/2024   ANIONGAP 7 04/15/2015   GFR 46.78 (L) 05/01/2024   Lab Results  Component Value Date   CHOL 216 (H) 05/01/2024   Lab Results  Component Value Date   HDL 105.00 05/01/2024   Lab Results  Component Value Date   LDLCALC 96 05/01/2024   Lab Results  Component Value Date   TRIG 75.0 05/01/2024   Lab Results  Component Value Date   CHOLHDL 2 05/01/2024   Lab Results  Component Value Date   HGBA1C 6.6 (H) 05/01/2024       Assessment & Plan:  Controlled  type 2 diabetes mellitus with complication, without long-term current use of insulin (HCC) Assessment & Plan: hgba1c acceptable, minimize simple carbs. Increase exercise as tolerated. Continue current meds   Orders: -     Hemoglobin A1c  Primary hypertension Assessment & Plan: Well controlled, no changes to meds. Encouraged heart healthy diet such as the DASH diet and exercise as tolerated.   Orders: -     Comprehensive metabolic panel with GFR -     CBC with Differential/Platelet -     Lisinopril ; Take 1 tablet (2.5 mg total) by mouth daily.  Dispense: 90 tablet; Refill: 1  Hyperlipidemia, unspecified hyperlipidemia type Assessment & Plan: Encourage heart healthy diet such as MIND or DASH diet, increase exercise, avoid trans fats, simple carbohydrates and processed foods, consider a krill or fish or flaxseed oil cap daily. Tolerating Rosuvastatin   Orders: -     Lipid panel  Hypothyroidism, unspecified type Assessment & Plan: On Levothyroxine , continue to monitor  Orders: -     TSH  Obesity, unspecified class, unspecified obesity type, unspecified whether serious comorbidity present Assessment & Plan: Encouraged DASH or MIND diet, decrease po intake and increase exercise as tolerated. Needs 7-8 hours of sleep nightly. Avoid trans fats, eat small, frequent meals every 4-5 hours with lean proteins, complex carbs and healthy fats. Minimize simple carbs, high fat foods and processed foods.     Age-related osteoporosis with current pathological fracture with routine healing, subsequent encounter Assessment & Plan: Encouraged to get adequate exercise, calcium  and vitamin d  intake    Proteinuria, unspecified type  Other orders -     Alendronate  Sodium; Take 1 tablet (70 mg total) by mouth every 7 (seven) days. TAKE 1 TABLET BY MOUTH EVERY 7 DAYS.  Dispense: 12 tablet; Refill: 3    Assessment and Plan Assessment & Plan Chronic Back Pain and Right Hip Pain Compression fracture in the back and hip fractures from 2022 cause intermittent pain and limit mobility. She is considering sports medicine consultation for further management. - Consider referral to sports medicine for further evaluation and management.  Hypertension Blood pressure is well-controlled with current medication regimen. Lisinopril  is used at a low dose for renal protection. She is on metoprolol  tartrate, which requires twice-daily dosing. Discussion about switching to metoprolol  succinate for once-daily dosing, but she prefers to continue current medication due to a large supply. - Refill lisinopril  2.5 mg. - Continue metoprolol  tartrate 12.5 mg twice daily. - Consider switching to metoprolol  succinate 25 mg once daily in the future if desired.  Hyperlipidemia Currently on rosuvastatin  20 mg daily. Prescription adjusted to 20 mg tablets to avoid cutting 40 mg tablets in half. - Continue rosuvastatin  20 mg daily.  Osteoporosis On alendronate  since 2022 with significant improvement in bone density. Considering a long-term course of alendronate  for five years. Discussion about Prolia, but she tolerates alendronate  well and has concerns about Prolia's side effects. Resumed taking collagen for bone health. - Continue alendronate  70 mg weekly. - Plan for a bone density scan in 18 months.  General Health Maintenance Actively managing health with hydration, reduced sweets, and maintaining a healthy weight.  Regular calcium  and vitamin D  supplementation contributing to improved bone density. She has lost weight and aims to reduce further. - Continue current lifestyle modifications including hydration and diet. - Continue calcium  and vitamin D  supplementation.     Harlene Horton, MD

## 2024-05-28 ENCOUNTER — Other Ambulatory Visit: Payer: Self-pay | Admitting: Family Medicine

## 2024-06-03 ENCOUNTER — Emergency Department (HOSPITAL_COMMUNITY)

## 2024-06-03 ENCOUNTER — Other Ambulatory Visit: Payer: Self-pay

## 2024-06-03 ENCOUNTER — Emergency Department (HOSPITAL_COMMUNITY)
Admission: EM | Admit: 2024-06-03 | Discharge: 2024-06-03 | Disposition: A | Discharge status: Home / Self Care | Attending: Emergency Medicine | Admitting: Emergency Medicine

## 2024-06-03 DIAGNOSIS — Z96652 Presence of left artificial knee joint: Secondary | ICD-10-CM | POA: Diagnosis not present

## 2024-06-03 DIAGNOSIS — W01198A Fall on same level from slipping, tripping and stumbling with subsequent striking against other object, initial encounter: Secondary | ICD-10-CM | POA: Insufficient documentation

## 2024-06-03 DIAGNOSIS — R918 Other nonspecific abnormal finding of lung field: Secondary | ICD-10-CM | POA: Diagnosis not present

## 2024-06-03 DIAGNOSIS — M25562 Pain in left knee: Secondary | ICD-10-CM | POA: Insufficient documentation

## 2024-06-03 DIAGNOSIS — W19XXXA Unspecified fall, initial encounter: Secondary | ICD-10-CM

## 2024-06-03 DIAGNOSIS — Y92009 Unspecified place in unspecified non-institutional (private) residence as the place of occurrence of the external cause: Secondary | ICD-10-CM | POA: Diagnosis not present

## 2024-06-03 DIAGNOSIS — M25561 Pain in right knee: Secondary | ICD-10-CM | POA: Diagnosis not present

## 2024-06-03 DIAGNOSIS — Z7901 Long term (current) use of anticoagulants: Secondary | ICD-10-CM | POA: Insufficient documentation

## 2024-06-03 DIAGNOSIS — M25462 Effusion, left knee: Secondary | ICD-10-CM | POA: Diagnosis not present

## 2024-06-03 DIAGNOSIS — S00511A Abrasion of lip, initial encounter: Secondary | ICD-10-CM | POA: Diagnosis not present

## 2024-06-03 DIAGNOSIS — I4891 Unspecified atrial fibrillation: Secondary | ICD-10-CM | POA: Diagnosis not present

## 2024-06-03 DIAGNOSIS — M4802 Spinal stenosis, cervical region: Secondary | ICD-10-CM | POA: Diagnosis not present

## 2024-06-03 DIAGNOSIS — M47812 Spondylosis without myelopathy or radiculopathy, cervical region: Secondary | ICD-10-CM | POA: Diagnosis not present

## 2024-06-03 DIAGNOSIS — I6523 Occlusion and stenosis of bilateral carotid arteries: Secondary | ICD-10-CM | POA: Diagnosis not present

## 2024-06-03 DIAGNOSIS — S0083XA Contusion of other part of head, initial encounter: Secondary | ICD-10-CM

## 2024-06-03 DIAGNOSIS — Z043 Encounter for examination and observation following other accident: Secondary | ICD-10-CM | POA: Diagnosis not present

## 2024-06-03 DIAGNOSIS — R220 Localized swelling, mass and lump, head: Secondary | ICD-10-CM | POA: Diagnosis not present

## 2024-06-03 DIAGNOSIS — M25461 Effusion, right knee: Secondary | ICD-10-CM | POA: Diagnosis not present

## 2024-06-03 DIAGNOSIS — S0993XA Unspecified injury of face, initial encounter: Secondary | ICD-10-CM | POA: Diagnosis present

## 2024-06-03 DIAGNOSIS — Z96651 Presence of right artificial knee joint: Secondary | ICD-10-CM | POA: Diagnosis not present

## 2024-06-03 DIAGNOSIS — S0990XA Unspecified injury of head, initial encounter: Secondary | ICD-10-CM | POA: Diagnosis not present

## 2024-06-03 LAB — CBC WITH DIFFERENTIAL/PLATELET
Abs Immature Granulocytes: 0.02 K/uL (ref 0.00–0.07)
Basophils Absolute: 0 K/uL (ref 0.0–0.1)
Basophils Relative: 1 %
Eosinophils Absolute: 0.1 K/uL (ref 0.0–0.5)
Eosinophils Relative: 2 %
HCT: 34.7 % — ABNORMAL LOW (ref 36.0–46.0)
Hemoglobin: 11.3 g/dL — ABNORMAL LOW (ref 12.0–15.0)
Immature Granulocytes: 0 %
Lymphocytes Relative: 24 %
Lymphs Abs: 1.4 K/uL (ref 0.7–4.0)
MCH: 32.2 pg (ref 26.0–34.0)
MCHC: 32.6 g/dL (ref 30.0–36.0)
MCV: 98.9 fL (ref 80.0–100.0)
Monocytes Absolute: 1 K/uL (ref 0.1–1.0)
Monocytes Relative: 17 %
Neutro Abs: 3.3 K/uL (ref 1.7–7.7)
Neutrophils Relative %: 56 %
Platelets: 182 K/uL (ref 150–400)
RBC: 3.51 MIL/uL — ABNORMAL LOW (ref 3.87–5.11)
RDW: 12.9 % (ref 11.5–15.5)
WBC: 5.9 K/uL (ref 4.0–10.5)
nRBC: 0 % (ref 0.0–0.2)

## 2024-06-03 LAB — BASIC METABOLIC PANEL WITH GFR
Anion gap: 10 (ref 5–15)
BUN: 19 mg/dL (ref 8–23)
CO2: 22 mmol/L (ref 22–32)
Calcium: 9.6 mg/dL (ref 8.9–10.3)
Chloride: 105 mmol/L (ref 98–111)
Creatinine, Ser: 1.63 mg/dL — ABNORMAL HIGH (ref 0.44–1.00)
GFR, Estimated: 31 mL/min — ABNORMAL LOW (ref >60)
Glucose, Bld: 114 mg/dL — ABNORMAL HIGH (ref 70–99)
Potassium: 3.6 mmol/L (ref 3.5–5.1)
Sodium: 137 mmol/L (ref 135–145)

## 2024-06-03 NOTE — ED Triage Notes (Signed)
 Patient brought in from home after tripping and falling from standing position. Patient denies any LOC however she hit her left forehead and hurt bilateral knees. Small abrasion noted to left forehead. Patient alert and oriented x4,

## 2024-06-03 NOTE — Progress Notes (Signed)
 Orthopedic Tech Progress Note Patient Details:  Wendy Munoz 23-Jun-1941 [id number]  Patient ID: Wendy Munoz, female   DOB: March 25, 1941, 83 y.o.   MRN: [id number] Level II; not currently needed. Wendy Munoz 06/03/2024, 8:09 PM

## 2024-06-03 NOTE — ED Provider Notes (Signed)
 [place] EMERGENCY DEPARTMENT AT [facility] Provider Note   CSN: [id number] Arrival date & time: 06/03/24  8040     Patient presents with: Fall (Fall on Thinner)   Wendy Munoz is a 83 y.o. female.   Patient here after mechanical fall.  Hit her forehead hit both knees.  Has abrasion to the lower lip.  She did not lose consciousness.  She is on Eliquis  for A-fib.  She has no other extremity pain.  She has got some chronic back pain from compression fracture in her back but not having any worsening pain in her back or any other extremity.  No chest pain shortness of breath weakness numbness tingling otherwise.  The history is provided by the patient.       Prior to Admission medications   Medication Sig Start Date End Date Taking? Authorizing Provider  alendronate  (FOSAMAX ) 70 MG tablet Take 1 tablet (70 mg total) by mouth every 7 (seven) days. TAKE 1 TABLET BY MOUTH EVERY 7 DAYS. 05/01/24   Domenica Harlene LABOR, MD  Calcium -Magnesium-Vitamin D  (CALCIUM  MAGNESIUM PO) Take 1 capsule by mouth daily.    [provider]  cetirizine (ZYRTEC) 10 MG tablet Take 10 mg by mouth at bedtime.    [provider]  Cholecalciferol (VITAMIN D ) 2000 UNITS tablet Take 6,000 Units by mouth daily.    [provider]  Coenzyme Q10 200 MG TABS Take 200 mg by mouth daily.    [provider]  ELIQUIS  5 MG TABS tablet TAKE 1 TABLET BY MOUTH TWICE A DAY 04/08/24   Revankar, Jennifer SAUNDERS, MD  levothyroxine  (SYNTHROID ) 88 MCG tablet TAKE 1 TABLET BY MOUTH EVERY DAY BEFORE BREAKFAST SKIP THE SATURDAY DOSE 05/28/24   Domenica Harlene LABOR, MD  lisinopril  (ZESTRIL ) 2.5 MG tablet Take 1 tablet (2.5 mg total) by mouth daily. 05/01/24   Domenica Harlene LABOR, MD  metoprolol  tartrate (LOPRESSOR ) 25 MG tablet Take 0.5 tablets (12.5 mg total) by mouth 2 (two) times daily. 11/12/23   Revankar, Jennifer SAUNDERS, MD  metroNIDAZOLE  (METROGEL ) 1 % gel Apply topically daily. 06/20/21   Domenica Harlene LABOR, MD   nitroGLYCERIN  (NITROSTAT ) 0.4 MG SL tablet Place 1 tablet (0.4 mg total) under the tongue every 5 (five) minutes as needed for chest pain. 11/02/23   Revankar, Jennifer SAUNDERS, MD  rosuvastatin  (CRESTOR ) 40 MG tablet Take 1 tablet (40 mg total) by mouth daily. 05/02/24   Domenica Harlene LABOR, MD    Allergies: Keflex [cephalexin]    Review of Systems  Updated Vital Signs BP (!) 121/55   Pulse 63   Temp 98.4 F (36.9 C) (Oral)   Resp (!) 23   Ht 5' (1.524 m)   Wt 90.7 kg   SpO2 100%   BMI 39.06 kg/m   Physical Exam Vitals and nursing note reviewed.  Constitutional:      General: She is not in acute distress.    Appearance: She is well-developed.  HENT:     Head:     Comments: Hematoma to her forehead, abrasion to her lower lip Eyes:     Extraocular Movements: Extraocular movements intact.     Conjunctiva/sclera: Conjunctivae normal.     Pupils: Pupils are equal, round, and reactive to light.  Cardiovascular:     Rate and Rhythm: Normal rate and regular rhythm.     Heart sounds: No murmur heard. Pulmonary:     Effort: Pulmonary effort is normal. No respiratory distress.  Breath sounds: Normal breath sounds.  Abdominal:     General: Abdomen is flat.     Palpations: Abdomen is soft.     Tenderness: There is no abdominal tenderness.  Musculoskeletal:        General: Tenderness present. No swelling.     Cervical back: Normal range of motion and neck supple. No tenderness.     Comments: No midline spinal tenderness, tenderness to both knees with no obvious deformity  Skin:    General: Skin is warm and dry.     Capillary Refill: Capillary refill takes less than 2 seconds.  Neurological:     General: No focal deficit present.     Mental Status: She is alert and oriented to person, place, and time.     Cranial Nerves: No cranial nerve deficit.     Sensory: No sensory deficit.     Motor: No weakness.     Coordination: Coordination normal.  Psychiatric:        Mood and Affect: Mood  normal.     (all labs ordered are listed, but only abnormal results are displayed) Labs Reviewed  CBC WITH DIFFERENTIAL/PLATELET - Abnormal; Notable for the following components:      Result Value   RBC 3.51 (*)    Hemoglobin 11.3 (*)    HCT 34.7 (*)    All other components within normal limits  BASIC METABOLIC PANEL WITH GFR - Abnormal; Notable for the following components:   Glucose, Bld 114 (*)    Creatinine, Ser 1.63 (*)    GFR, Estimated 31 (*)    All other components within normal limits    EKG: None  Radiology: CT Maxillofacial Wo Contrast Result Date: 06/03/2024 CLINICAL DATA:  Status post fall. EXAM: CT MAXILLOFACIAL WITHOUT CONTRAST TECHNIQUE: Multidetector CT imaging of the maxillofacial structures was performed. Multiplanar CT image reconstructions were also generated. RADIATION DOSE REDUCTION: This exam was performed according to the departmental dose-optimization program which includes automated exposure control, adjustment of the mA and/or kV according to patient size and/or use of iterative reconstruction technique. COMPARISON:  None Available. FINDINGS: Osseous: No fracture or mandibular dislocation. No destructive process. Orbits: Negative. No traumatic or inflammatory finding. Sinuses: Clear. Soft tissues: Very mild left supra orbital soft tissue swelling is seen. Limited intracranial: No significant or unexpected finding. IMPRESSION: Very mild left supra orbital soft tissue swelling without evidence of an acute osseous abnormality. Electronically Signed   By: Suzen Dials M.D.   On: 06/03/2024 21:15   CT Cervical Spine Wo Contrast Result Date: 06/03/2024 CLINICAL DATA:  Status post fall. EXAM: CT CERVICAL SPINE WITHOUT CONTRAST TECHNIQUE: Multidetector CT imaging of the cervical spine was performed without intravenous contrast. Multiplanar CT image reconstructions were also generated. RADIATION DOSE REDUCTION: This exam was performed according to the departmental  dose-optimization program which includes automated exposure control, adjustment of the mA and/or kV according to patient size and/or use of iterative reconstruction technique. COMPARISON:  None Available. FINDINGS: Alignment: There is approximately 2 mm anterolisthesis of the C4 vertebral body on C5. Skull base and vertebrae: No acute fracture. No primary bone lesion or focal pathologic process. Soft tissues and spinal canal: No prevertebral fluid or swelling. No visible canal hematoma. Disc levels: Marked severity endplate sclerosis is seen at the levels of C5-C6 and C6-C7, with marked severity anterior osteophyte formation present at the levels of C4-C5, C5-C6 and C6-C7. Marked severity posterior bony spurring is seen at C5-C6 and C6-C7. There is marked severity intervertebral disc  space narrowing at C5-C6 and C6-C7 with mild intervertebral disc space narrowing present throughout the remainder of the cervical spine. Bilateral marked severity multilevel facet joint hypertrophy is noted. Upper chest: There is mild to moderate severity biapical scarring and/or atelectasis. Other: None. IMPRESSION: 1. No acute fracture or traumatic subluxation. 2. Marked severity multilevel degenerative changes, most prominent at the levels of C5-C6 and C6-C7. 3. Mild to moderate severity biapical scarring and/or atelectasis. Electronically Signed   By: Suzen Dials M.D.   On: 06/03/2024 21:13   DG Knee Complete 4 Views Right Result Date: 06/03/2024 CLINICAL DATA:  Status post fall. EXAM: RIGHT KNEE - COMPLETE 4+ VIEW COMPARISON:  None Available. FINDINGS: There is an intact right knee replacement. No evidence of an acute fracture or dislocation. A small suprapatellar effusion is suspected. IMPRESSION: 1. Intact right knee replacement. 2. Small suprapatellar effusion. Electronically Signed   By: Suzen Dials M.D.   On: 06/03/2024 21:09   DG Knee Complete 4 Views Left Result Date: 06/03/2024 CLINICAL DATA:  Status post  fall. EXAM: LEFT KNEE - COMPLETE 4+ VIEW COMPARISON:  None Available. FINDINGS: There is an intact left knee replacement. No evidence of an acute fracture or dislocation. A small suprapatellar effusion is seen. IMPRESSION: 1. Intact left knee replacement. 2. Small suprapatellar effusion. Electronically Signed   By: Suzen Dials M.D.   On: 06/03/2024 21:08   CT Head Wo Contrast Result Date: 06/03/2024 CLINICAL DATA:  Status post fall. EXAM: CT HEAD WITHOUT CONTRAST TECHNIQUE: Contiguous axial images were obtained from the base of the skull through the vertex without intravenous contrast. RADIATION DOSE REDUCTION: This exam was performed according to the departmental dose-optimization program which includes automated exposure control, adjustment of the mA and/or kV according to patient size and/or use of iterative reconstruction technique. COMPARISON:  None Available. FINDINGS: Brain: No evidence of acute infarction, hemorrhage, hydrocephalus, extra-axial collection or mass lesion/mass effect. Vascular: Moderate to marked severity bilateral cavernous carotid artery calcification is noted. Skull: Normal. Negative for fracture or focal lesion. Sinuses/Orbits: No acute finding. Other: Very mild left frontal scalp soft tissue swelling is noted. IMPRESSION: 1. No acute intracranial abnormality. 2. Very mild left frontal scalp soft tissue swelling. Electronically Signed   By: Suzen Dials M.D.   On: 06/03/2024 21:07     Procedures   Medications Ordered in the ED - No data to display                                  Medical Decision Making Amount and/or Complexity of Data Reviewed Labs: ordered. Radiology: ordered.   Brookelin Felber Kady is here after mechanical fall.  Level 2 trauma as patient has A-fib on Eliquis .  Hematoma to her forehead.  Abrasion to her lower lip.  Will get a CT scan of her head neck face x-rays of both knees.  Not having pain elsewhere.  She is well-appearing.  Will check basic  labs as well.  She does not want any pain medicine at this time.  Overall lab work is unremarkable per my review and interpretation.  CT scan of the head neck face with no acute findings.  Bilateral knee x-rays without any fracture or malalignment.  Overall suspect contusion and bruises.  Recommend Tylenol  ice and rest.  She is able to ambulate without any issues.  Discharge.  This chart was dictated using voice recognition software.  Despite best efforts to proofread,  errors can occur which can change the documentation meaning.      Final diagnoses:  Fall, initial encounter  Contusion of face, initial encounter    ED Discharge Orders     None          Ruthe Cornet, DO 06/03/24 2227

## 2024-06-03 NOTE — ED Notes (Signed)
 Daughter call and updated on patient diagnosis.

## 2024-06-03 NOTE — ED Notes (Signed)
 Fall Risk armband applied at registration

## 2024-06-03 NOTE — ED Notes (Signed)
 Patient up walking to bathroom, gait steady. Patient stated her knees are hurting however refused pain meds. Bruises noted to bilateral knees

## 2024-06-03 NOTE — ED Notes (Signed)
 Trauma Response Nurse Documentation   Wendy Munoz is a 83 y.o. female arriving to [facility] ED via EMS  On Eliquis  (apixaban ) daily. Trauma was activated as a Level 2 by ED charge RN based on the following trauma criteria Elderly patients > 65 with head trauma on anti-coagulation (excluding ASA).  Patient cleared for CT by Dr. Ruthe EDP. Pt transported to CT with trauma response nurse present to monitor. RN remained with the patient throughout their absence from the department for clinical observation.   GCS 15.  History   Past Medical History:  Diagnosis Date   Allergic state 06/26/2017   Allergy 06/26/2017   IMO SNOMED Dx Update Oct 2024     Anemia 05/21/2017   Arthritis    Atrial tachycardia (HCC)    a. asymptomatic. Noted on tele during 11/2014 admission    Bilateral sacral insufficiency fracture 06/29/2021   CAD (coronary artery disease)    a. 11/2014 inf STEMI s/p DES to RCA; 50-70% ruptured plaque in pLAD- rx medically   Controlled type 2 diabetes mellitus with complication, without long-term current use of insulin (HCC) 08/10/2023   GERD (gastroesophageal reflux disease)    Hematuria 02/18/2017   Hemorrhoids 05/11/2019   HTN (hypertension)    Hyperglycemia    Hyperlipidemia    Hypothyroidism    Insomnia 05/27/2017   Ischemic cardiomyopathy    a. 11/2014  LV gram with inferior hypokinesis. EF 45-50%.   Macrocytic anemia 05/21/2017   Nocturia 01/08/2017   Obesity 06/26/2017   On apixaban  therapy 02/12/2019   Osteoporosis    PAF (paroxysmal atrial fibrillation) (HCC)    a. isolated episode of atrial fibrillation several years ago associated w/ her thyroid  dysfunction (prior to ablation).     Preventative health care 06/26/2017   Rosacea 06/15/2021   S/P knee replacement 04/03/2013   Thickened endometrium 11/01/2021   Urinary frequency 05/27/2017   Vasomotor rhinitis 06/26/2017     Past Surgical History:  Procedure Laterality Date   BREAST SURGERY     breast  biospy   CATARACT EXTRACTION Bilateral    COLONOSCOPY     EYE SURGERY Bilateral 2005, 2006   Cataract with lens ImplaNT    INGUINAL HERNIA REPAIR Right    LEFT HEART CATHETERIZATION WITH CORONARY ANGIOGRAM N/A 12/02/2014   Procedure: LEFT HEART CATHETERIZATION WITH CORONARY ANGIOGRAM;  Surgeon: Victory LELON Claudene DOUGLAS, MD;  Location: [facility] CATH LAB;  Service: Cardiovascular;  Laterality: N/A;   ORIF HUMERUS FRACTURE Right 01/27/2014   Procedure: RIGHT OPEN REDUCTION INTERNAL FIXATION (ORIF) PROXIMAL HUMERUS FRACTURE;  Surgeon: Ozell VEAR Bruch, MD;  Location: [facility] OR;  Service: Orthopedics;  Laterality: Right;   TOTAL KNEE ARTHROPLASTY Right 2006   TOTAL KNEE ARTHROPLASTY Left 2011   UMBILICAL HERNIA REPAIR     as a baby       Initial Focused Assessment (If applicable, or please see trauma documentation): Alert/oriented female presents via EMS from home after a mechanical GLF, states she tripped over a cushion. Abrasions to face and left lower lip.  Airway patent, BS clear  Bleeding from facial abrasions controlled GCS 15 PERRLA sluggish 3  CT's Completed:   CT Head, CT Maxillofacial, and CT C-Spine   Interventions:  IV start and trauma lab draw Bilateral knee XRAYS CT head, c-spine, maxillofacial  Plan for disposition:  Discharge home   Consults completed:  none  Event Summary: Pt presents via EMS after a mechanical fall in her home. Denies LOC, on eliquis  for AFIB.  C/o facial abrasions and bilateral knee pain. Bruising noted to bilateral knees. Bleeding controlled to facial abrasions. Trauma scans unremarkable. D/c home.   Bedside handoff with ED RN Lucie.    Carlisle Torgeson O Deb Loudin  Trauma Response RN  Please call TRN at [phone] for further assistance.

## 2024-07-15 ENCOUNTER — Ambulatory Visit: Admitting: Sports Medicine

## 2024-07-15 ENCOUNTER — Telehealth: Payer: Self-pay | Admitting: Pharmacist

## 2024-07-15 ENCOUNTER — Ambulatory Visit (HOSPITAL_BASED_OUTPATIENT_CLINIC_OR_DEPARTMENT_OTHER)
Admission: RE | Admit: 2024-07-15 | Discharge: 2024-07-15 | Disposition: A | Discharge status: Home / Self Care | Source: Ambulatory Visit | Attending: Sports Medicine | Admitting: Sports Medicine

## 2024-07-15 ENCOUNTER — Encounter: Payer: Self-pay | Admitting: Sports Medicine

## 2024-07-15 VITALS — BP 130/80 | Ht 60.0 in | Wt 200.0 lb

## 2024-07-15 DIAGNOSIS — M48061 Spinal stenosis, lumbar region without neurogenic claudication: Secondary | ICD-10-CM | POA: Diagnosis not present

## 2024-07-15 DIAGNOSIS — M25551 Pain in right hip: Secondary | ICD-10-CM | POA: Insufficient documentation

## 2024-07-15 DIAGNOSIS — M47816 Spondylosis without myelopathy or radiculopathy, lumbar region: Secondary | ICD-10-CM | POA: Diagnosis not present

## 2024-07-15 DIAGNOSIS — M4316 Spondylolisthesis, lumbar region: Secondary | ICD-10-CM | POA: Diagnosis not present

## 2024-07-15 MED ORDER — METHYLPREDNISOLONE ACETATE 40 MG/ML IJ SUSP
40.0000 mg | Freq: Once | INTRAMUSCULAR | Status: AC
  Administered 2024-07-15: 80 mg via INTRAMUSCULAR

## 2024-07-15 NOTE — Progress Notes (Signed)
   Subjective:    Patient ID: Wendy Munoz, female    DOB: 1941-04-21, 83 y.o.   MRN: [id number]  HPI chief complaint: Hip pain  Patient is a very pleasant 83 year old female that presents today with several weeks of posterior right hip pain.  She fell on August 12.  Was seen in the emergency room.  At that time, she was not complaining of hip or low back pain.  Possibly 2 weeks after that fall, she began to experience some right sided posterior hip pain with some radiating pain, numbness, and tingling from the right leg.  Numbness and tingling have improved.  Pain persist.  She is concerned because she has a history of a sacral insufficiency fracture as well as a previous right hip fracture.  She is osteoporotic and on medications for that.  She denies groin pain.  She has been able to ambulate.  She has been taking a magnesium pill which seems to help.    Review of Systems As above    Objective:   Physical Exam  Well-developed, well-nourished.  No acute distress  Lumbar spine: There is some tenderness to palpation along the right side of the sacrum.  No tenderness to palpation along the lumbar midline.  Mildly limited range of motion.  No gross neurological deficit of either lower extremity.  Right hip: Smooth painless hip range of motion with a negative logroll.  X-rays of the right hip including AP and lateral view show no acute fracture.  X-rays of the sacrum showed no fracture.  X-rays of the lumbar spine show multilevel lumbar spondylosis with a degenerative grade 1 anterior listhesis at L4-L5 which appears unchanged when compared to x-rays from September 2022.     Assessment & Plan:   Posterior right hip pain likely secondary to lumbar radiculopathy  Luckily, the patient's symptoms are improving.  I recommend 80 mg of Depo-Medrol  IM.  If symptoms do not continue to improve then consider merits of formal physical therapy.  Follow-up for ongoing or recalcitrant issues.  This  note was dictated using Dragon naturally speaking software and may contain errors in syntax, spelling, or content which have not been identified prior to signing this note.

## 2024-07-15 NOTE — Progress Notes (Signed)
 Pharmacy Quality Measure Review  This patient is appearing on a report for being at risk of failing the adherence measure for hypertension (ACEi/ARB) medications this calendar year.   Medication: lisinopril  2.5mg  Last fill date: 05/01/2024 for 90 day supply Other fill dates for 2025 - only one so far on 11/10/2023.   Patient had 83 days between first and second fill in 2025.  She is not due to refill lisinopril  again until around 07/30/2024 even thought LID on adherence report is 07/17/2024. She has 1 refill remaining on her current prescription for lisinopril  and will see PCP for physical in December 2025.    No action needed at this time patient has lisinopril  on hand. She did appear to have low adherence earlier in 2025 and has already failed [facility] measure. Will continue to follow adherence for rest of 2025 and into 2026 with intervention as needed.   Madelin Ray, PharmD Clinical Pharmacist [facility]  Population Health [phone]

## 2024-07-16 ENCOUNTER — Encounter: Payer: Self-pay | Admitting: Sports Medicine

## 2024-08-02 ENCOUNTER — Other Ambulatory Visit: Payer: Self-pay | Admitting: Family Medicine

## 2024-08-02 DIAGNOSIS — E785 Hyperlipidemia, unspecified: Secondary | ICD-10-CM

## 2024-08-04 ENCOUNTER — Encounter: Payer: Self-pay | Admitting: Family Medicine

## 2024-08-06 ENCOUNTER — Other Ambulatory Visit (INDEPENDENT_AMBULATORY_CARE_PROVIDER_SITE_OTHER)

## 2024-08-06 DIAGNOSIS — E785 Hyperlipidemia, unspecified: Secondary | ICD-10-CM

## 2024-08-07 ENCOUNTER — Ambulatory Visit: Payer: Self-pay | Admitting: Family Medicine

## 2024-08-07 LAB — LIPID PANEL
Cholesterol: 215 mg/dL — ABNORMAL HIGH (ref 0–200)
HDL: 104.9 mg/dL (ref >39.00)
LDL Cholesterol: 94 mg/dL (ref 0–99)
NonHDL: 110.4
Total CHOL/HDL Ratio: 2
Triglycerides: 84 mg/dL (ref 0.0–149.0)
VLDL: 16.8 mg/dL (ref 0.0–40.0)

## 2024-08-07 NOTE — Progress Notes (Signed)
 Seen by patient Wendy Munoz on 08/07/2024  1:12 PM

## 2024-08-12 ENCOUNTER — Encounter: Payer: PPO | Admitting: Family Medicine

## 2024-08-21 ENCOUNTER — Encounter: Payer: Self-pay | Admitting: Pharmacist

## 2024-08-21 NOTE — Progress Notes (Signed)
 Pharmacy Quality Measure Review  This patient is appearing on a report for being at risk of failing the adherence measure for hypertension (ACEi/ARB) medications this calendar year.   Medication: lisinopril  2.5mg  Last fill date: 08/18/2024 for 90 day supply Other fill dates for 2025 - 11/10/23 and 05/01/24 Patient had 83 days between first and second fill in 2025.  She has 1 refill remaining on her current prescription for lisinopril  and will see PCP for physical in December 2025.   Reviewed rosuvastatin  refills also -  filled 40mg  daily for 90 day supply 2/10, 5/8 and 7/15 but also filled rosuvastatin  20mg  for #90 on 05/01/2024 - patient is not due to refill yet if she is using 20mg  tabs but should need to refill rosuvastatin  in the end of November.    No action needed at this time patient has lisinopril  on hand.  Will continue to follow adherence for rest of 2025 and into 2026 with intervention as needed.   Madelin Ray, PharmD Clinical Pharmacist [facility]  Population Health [phone]

## 2024-09-08 DIAGNOSIS — L820 Inflamed seborrheic keratosis: Secondary | ICD-10-CM | POA: Diagnosis not present

## 2024-09-08 DIAGNOSIS — L821 Other seborrheic keratosis: Secondary | ICD-10-CM | POA: Diagnosis not present

## 2024-09-08 DIAGNOSIS — L578 Other skin changes due to chronic exposure to nonionizing radiation: Secondary | ICD-10-CM | POA: Diagnosis not present

## 2024-09-08 DIAGNOSIS — I781 Nevus, non-neoplastic: Secondary | ICD-10-CM | POA: Diagnosis not present

## 2024-09-25 ENCOUNTER — Encounter: Payer: PPO | Admitting: Family Medicine

## 2024-09-26 DIAGNOSIS — G8929 Other chronic pain: Secondary | ICD-10-CM | POA: Insufficient documentation

## 2024-09-26 HISTORY — DX: Other chronic pain: G89.29

## 2024-09-26 NOTE — Assessment & Plan Note (Signed)
Stable, follows with cardiology

## 2024-09-26 NOTE — Assessment & Plan Note (Signed)
 Rate controlled. On Eliquis  and metoprolol 

## 2024-09-26 NOTE — Assessment & Plan Note (Signed)
 Compression fracture in the back and hip fractures from 2022 cause intermittent pain and limit mobility. She is considering sports medicine consultation for further management. - Consider referral to sports medicine for further evaluation and management.

## 2024-09-26 NOTE — Assessment & Plan Note (Signed)
 Tolerating statin. Encourage heart healthy diet such as MIND or DASH diet, increase exercise, avoid trans fats, simple carbohydrates and processed foods, consider a krill or fish or flaxseed oil cap daily.

## 2024-09-26 NOTE — Assessment & Plan Note (Addendum)
 On alendronate  since 2022 with significant improvement in bone density. Considering a long-term course of alendronate  for five years.  - Continue alendronate  70 mg weekly. -Last DEXA 11/2023, Repeat 1 year

## 2024-09-26 NOTE — Assessment & Plan Note (Signed)
 On Levothyroxine, continue to monitor

## 2024-09-26 NOTE — Progress Notes (Addendum)
 Subjective:     Patient ID: Wendy Munoz, female    DOB: [birth year], 84 y.o.   MRN: [id number]  Chief Complaint  Patient presents with   Annual Exam    Physical exam   Medication Problem    Patient would like to discuss taking the Crestor    Nasal Congestion    Pretty bad in the morning , runs after she eats her nose runs   Cough    HPI  Discussed the use of AI scribe software for clinical note transcription with the patient, who gave verbal consent to proceed.  Follows with: Cardiology, Sports medicine  HTN-lisinopril  2.5 mg daily, metoprolol  12.5 mg twice daily  HLD-rosuvastatin  40 mg daily  PAF-on Eliquis  5 mg twice daily  Osteoporosis-alendronate  (Fosamax ) 70 mg weekly  Hypothyroid-levothyroxine  88 mcg x 6 days per week, skip Saturday  Patient denies fever, chills, SOB, CP, palpitations, dyspnea, edema, HA, vision changes, N/V/D, abdominal pain, urinary symptoms, rash, weight changes, and recent illness or hospitalizations.    History of Present Illness              Health Maintenance Due  Topic Date Due   FOOT EXAM  Never done   Zoster Vaccines- Shingrix (1 of 2) Never done   OPHTHALMOLOGY EXAM  07/27/2017   Medicare Annual Wellness (AWV)  09/05/2024    Past Medical History:  Diagnosis Date   Allergic state 06/26/2017   Allergy 06/26/2017   IMO SNOMED Dx Update Oct 2024     Anemia 05/21/2017   Arthritis    Atrial tachycardia    a. asymptomatic. Noted on tele during 11/2014 admission    Bilateral sacral insufficiency fracture 06/29/2021   CAD (coronary artery disease)    a. 11/2014 inf STEMI s/p DES to RCA; 50-70% ruptured plaque in pLAD- rx medically   Controlled type 2 diabetes mellitus with complication, without long-term current use of insulin (HCC) 08/10/2023   GERD (gastroesophageal reflux disease)    Hematuria 02/18/2017   Hemorrhoids 05/11/2019   HTN (hypertension)    Hyperglycemia    Hyperlipidemia    Hypothyroidism    Insomnia  05/27/2017   Ischemic cardiomyopathy    a. 11/2014  LV gram with inferior hypokinesis. EF 45-50%.   Macrocytic anemia 05/21/2017   Nocturia 01/08/2017   Obesity 06/26/2017   On apixaban  therapy 02/12/2019   Osteoporosis    PAF (paroxysmal atrial fibrillation) (HCC)    a. isolated episode of atrial fibrillation several years ago associated w/ her thyroid  dysfunction (prior to ablation).     Preventative health care 06/26/2017   Rosacea 06/15/2021   S/P knee replacement 04/03/2013   Thickened endometrium 11/01/2021   Urinary frequency 05/27/2017   Vasomotor rhinitis 06/26/2017    Past Surgical History:  Procedure Laterality Date   BREAST SURGERY     breast biospy   CATARACT EXTRACTION Bilateral    COLONOSCOPY     EYE SURGERY Bilateral 2005, 2006   Cataract with lens ImplaNT    INGUINAL HERNIA REPAIR Right    LEFT HEART CATHETERIZATION WITH CORONARY ANGIOGRAM N/A 12/02/2014   Procedure: LEFT HEART CATHETERIZATION WITH CORONARY ANGIOGRAM;  Surgeon: Victory LELON Claudene DOUGLAS, MD;  Location: [facility] CATH LAB;  Service: Cardiovascular;  Laterality: N/A;   ORIF HUMERUS FRACTURE Right 01/27/2014   Procedure: RIGHT OPEN REDUCTION INTERNAL FIXATION (ORIF) PROXIMAL HUMERUS FRACTURE;  Surgeon: Ozell VEAR Bruch, MD;  Location: [facility] OR;  Service: Orthopedics;  Laterality: Right;   TOTAL KNEE ARTHROPLASTY Right 2006  TOTAL KNEE ARTHROPLASTY Left 2011   UMBILICAL HERNIA REPAIR     as a baby    Family History  Problem Relation Age of Onset   CAD Mother    Heart disease Mother    Heart failure Mother    Polycythemia Father    Multiple myeloma Father    Cancer Sister        breast cancer   Stroke Sister    Hyperlipidemia Sister    Hypertension Sister    Breast cancer Sister 46   Neuropathy Sister    Heart disease Brother        bradycardia   Hypertension Brother    Atrial fibrillation Daughter    Arthritis Maternal Aunt    Breast cancer Maternal Aunt    Lung cancer Paternal Grandmother    Prostate  cancer Paternal Grandfather    Diabetes Paternal Grandfather    Breast cancer Other     Social History   Socioeconomic History   Marital status: Married    Spouse name: Not on file   Number of children: Not on file   Years of education: Not on file   Highest education level: 12th grade  Occupational History   Occupation: retired   Tobacco Use   Smoking status: Never   Smokeless tobacco: Never  Vaping Use   Vaping status: Never Used  Substance and Sexual Activity   Alcohol  use: No    Alcohol /week: 0.0 standard drinks of alcohol    Drug use: No   Sexual activity: Yes    Partners: Male  Other Topics Concern   Not on file  Social History Narrative   Lives with husband   No pets   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    One daughter   Social Drivers of Corporate Investment Banker Strain: Low Risk  (09/25/2024)   Overall Financial Resource Strain (CARDIA)    Difficulty of Paying Living Expenses: Not hard at all  Food Insecurity: No Food Insecurity (09/25/2024)   Hunger Vital Sign    Worried About Running Out of Food in the Last Year: Never true    Ran Out of Food in the Last Year: Never true  Transportation Needs: No Transportation Needs (09/25/2024)   PRAPARE - Administrator, Civil Service (Medical): No    Lack of Transportation (Non-Medical): No  Physical Activity: Inactive (09/25/2024)   Exercise Vital Sign    Days of Exercise per Week: 0 days    Minutes of Exercise per Session: Not on file  Stress: No Stress Concern Present (09/25/2024)   [employer] of Occupational Health - Occupational Stress Questionnaire    Feeling of Stress: Not at all  Social Connections: Unknown (09/25/2024)   Social Connection and Isolation Panel    Frequency of Communication with Friends and Family: Twice a week    Frequency of Social Gatherings with Friends and Family: Patient declined    Attends Religious Services: More than 4 times per year    Active  Member of [employer] or Organizations: No    Attends Banker Meetings: Not on file    Marital Status: Married  Catering Manager Violence: Not At Risk (09/06/2023)   Humiliation, Afraid, Rape, and Kick questionnaire    Fear of Current or Ex-Partner: No    Emotionally Abused: No    Physically Abused: No    Sexually Abused: No    Outpatient Medications Prior to Visit  Medication Sig Dispense Refill  alendronate  (FOSAMAX ) 70 MG tablet Take 1 tablet (70 mg total) by mouth every 7 (seven) days. TAKE 1 TABLET BY MOUTH EVERY 7 DAYS. 12 tablet 3   Calcium -Magnesium-Vitamin D  (CALCIUM  MAGNESIUM PO) Take 1 capsule by mouth daily.     cetirizine (ZYRTEC) 10 MG tablet Take 10 mg by mouth at bedtime.     Cholecalciferol (VITAMIN D ) 2000 UNITS tablet Take 6,000 Units by mouth daily.     Coenzyme Q10 200 MG TABS Take 200 mg by mouth daily.     ELIQUIS  5 MG TABS tablet TAKE 1 TABLET BY MOUTH TWICE A DAY 180 tablet 3   levothyroxine  (SYNTHROID ) 88 MCG tablet TAKE 1 TABLET BY MOUTH EVERY DAY BEFORE BREAKFAST SKIP THE SATURDAY DOSE 90 tablet 1   lisinopril  (ZESTRIL ) 2.5 MG tablet Take 1 tablet (2.5 mg total) by mouth daily. 90 tablet 1   metoprolol  tartrate (LOPRESSOR ) 25 MG tablet Take 0.5 tablets (12.5 mg total) by mouth 2 (two) times daily. 90 tablet 3   nitroGLYCERIN  (NITROSTAT ) 0.4 MG SL tablet Place 1 tablet (0.4 mg total) under the tongue every 5 (five) minutes as needed for chest pain. 25 tablet 11   rosuvastatin  (CRESTOR ) 40 MG tablet TAKE 1 TABLET BY MOUTH EVERY DAY 90 tablet 1   metroNIDAZOLE  (METROGEL ) 1 % gel Apply topically daily. (Patient not taking: Reported on 09/30/2024) 45 g 2   No facility-administered medications prior to visit.    Allergies  Allergen Reactions   Keflex [Cephalexin] Rash    ROS    See HPI Objective:    Physical Exam Vitals reviewed.  Constitutional:      General: She is not in acute distress.    Appearance: She is obese. She is not toxic-appearing.   HENT:     Head: Normocephalic and atraumatic.     Mouth/Throat:     Mouth: Mucous membranes are moist.     Pharynx: Oropharynx is clear.  Eyes:     Extraocular Movements: Extraocular movements intact.     Pupils: Pupils are equal, round, and reactive to light.  Cardiovascular:     Rate and Rhythm: Normal rate and regular rhythm.     Pulses: Normal pulses.     Heart sounds: Normal heart sounds. No murmur heard. Pulmonary:     Effort: Pulmonary effort is normal. No respiratory distress.     Breath sounds: Normal breath sounds. No wheezing.  Musculoskeletal:        General: Swelling present.     Cervical back: Neck supple.     Right lower leg: Edema present.     Left lower leg: Edema present.     Comments: +2 BLE edema  Skin:    General: Skin is warm and dry.  Neurological:     General: No focal deficit present.     Mental Status: She is alert and oriented to person, place, and time.     Comments: +kyphosis  Psychiatric:        Mood and Affect: Mood normal.        Behavior: Behavior normal.        Thought Content: Thought content normal.        Judgment: Judgment normal.      BP 117/75 (BP Location: Left Arm, Patient Position: Sitting, Cuff Size: Large)   Pulse 66   Temp 97.8 F (36.6 C) (Oral)   Ht 5' 1.5 (1.562 m)   Wt 200 lb (90.7 kg)   SpO2 97%   BMI  37.18 kg/m  Wt Readings from Last 3 Encounters:  09/30/24 200 lb (90.7 kg)  07/15/24 200 lb (90.7 kg)  06/03/24 200 lb (90.7 kg)       Assessment & Plan:   Problem List Items Addressed This Visit     CAD (coronary artery disease)   Stable, follows with cardiology.      HTN (hypertension)   Well controlled, no changes to meds. Encouraged heart healthy diet such as the DASH diet and exercise as tolerated.         Relevant Orders   Basic Metabolic Panel (BMET)   CBC   Hyperlipidemia   Tolerating statin.Encourage heart healthy diet such as MIND or DASH diet, increase exercise, avoid trans fats, simple  carbohydrates and processed foods, consider a krill or fish or flaxseed oil cap daily.       Relevant Orders   Lipid panel   Hypothyroidism   On Levothyroxine , continue to monitor      Relevant Orders   TSH   T4, free   Osteoporosis   On alendronate  since 2022 with significant improvement in bone density. Considering a long-term course of alendronate  for five years.  - Continue alendronate  70 mg weekly. -Last DEXA 11/2023, Repeat 1 year       Other chronic pain   Compression fracture in the back and hip fractures from 2022 cause intermittent pain and limit mobility. She is considering sports medicine consultation for further management. - Consider referral to sports medicine for further evaluation and management.      PAF (paroxysmal atrial fibrillation) (HCC)   Rate controlled. On Eliquis  and metoprolol       Preventative health care - Primary   Other Visit Diagnoses       Hyperglycemia       Relevant Orders   HgB A1c     Vitamin D  deficiency       Relevant Orders   Vitamin D  (25 hydroxy)      FU 6 months   I am having Yuritzy Zehring. Antonio maintain her cetirizine, Vitamin D , Calcium -Magnesium-Vitamin D  (CALCIUM  MAGNESIUM PO), Coenzyme Q10, metroNIDAZOLE , nitroGLYCERIN , metoprolol  tartrate, Eliquis , alendronate , lisinopril , levothyroxine , and rosuvastatin .  No orders of the defined types were placed in this encounter.

## 2024-09-26 NOTE — Assessment & Plan Note (Signed)
 Well controlled, no changes to meds. Encouraged heart healthy diet such as the DASH diet and exercise as tolerated.

## 2024-09-30 ENCOUNTER — Encounter: Payer: Self-pay | Admitting: Student

## 2024-09-30 ENCOUNTER — Ambulatory Visit: Admitting: Student

## 2024-09-30 VITALS — BP 117/75 | HR 66 | Temp 97.8°F | Ht 61.5 in | Wt 200.0 lb

## 2024-09-30 DIAGNOSIS — I10 Essential (primary) hypertension: Secondary | ICD-10-CM

## 2024-09-30 DIAGNOSIS — G8929 Other chronic pain: Secondary | ICD-10-CM

## 2024-09-30 DIAGNOSIS — I2510 Atherosclerotic heart disease of native coronary artery without angina pectoris: Secondary | ICD-10-CM

## 2024-09-30 DIAGNOSIS — M8000XD Age-related osteoporosis with current pathological fracture, unspecified site, subsequent encounter for fracture with routine healing: Secondary | ICD-10-CM

## 2024-09-30 DIAGNOSIS — E785 Hyperlipidemia, unspecified: Secondary | ICD-10-CM

## 2024-09-30 DIAGNOSIS — Z0000 Encounter for general adult medical examination without abnormal findings: Secondary | ICD-10-CM | POA: Diagnosis not present

## 2024-09-30 DIAGNOSIS — E559 Vitamin D deficiency, unspecified: Secondary | ICD-10-CM

## 2024-09-30 DIAGNOSIS — I480 Paroxysmal atrial fibrillation: Secondary | ICD-10-CM

## 2024-09-30 DIAGNOSIS — R739 Hyperglycemia, unspecified: Secondary | ICD-10-CM

## 2024-09-30 DIAGNOSIS — E039 Hypothyroidism, unspecified: Secondary | ICD-10-CM | POA: Diagnosis not present

## 2024-09-30 LAB — LIPID PANEL
Cholesterol: 181 mg/dL (ref <200)
HDL: 99 mg/dL (ref >=50)
LDL Cholesterol (Calc): 65 mg/dL
Non-HDL Cholesterol (Calc): 82 mg/dL (ref <130)
Total CHOL/HDL Ratio: 1.8 (calc) (ref <5.0)
Triglycerides: 88 mg/dL (ref <150)

## 2024-10-01 ENCOUNTER — Encounter: Payer: Self-pay | Admitting: Student

## 2024-10-01 ENCOUNTER — Ambulatory Visit: Payer: Self-pay | Admitting: Student

## 2024-10-01 DIAGNOSIS — D649 Anemia, unspecified: Secondary | ICD-10-CM

## 2024-10-01 DIAGNOSIS — R748 Abnormal levels of other serum enzymes: Secondary | ICD-10-CM

## 2024-10-01 DIAGNOSIS — R944 Abnormal results of kidney function studies: Secondary | ICD-10-CM

## 2024-10-01 LAB — CBC
HCT: 31.9 % — ABNORMAL LOW (ref 36.0–46.0)
Hemoglobin: 10.7 g/dL — ABNORMAL LOW (ref 12.0–15.0)
MCHC: 33.4 g/dL (ref 30.0–36.0)
MCV: 95.3 fl (ref 78.0–100.0)
Platelets: 173 K/uL (ref 150.0–400.0)
RBC: 3.35 Mil/uL — ABNORMAL LOW (ref 3.87–5.11)
RDW: 14.3 % (ref 11.5–15.5)
WBC: 5.9 K/uL (ref 4.0–10.5)

## 2024-10-01 LAB — HEMOGLOBIN A1C: Hgb A1c MFr Bld: 6.3 % (ref 4.6–6.5)

## 2024-10-01 LAB — BASIC METABOLIC PANEL WITH GFR
BUN: 17 mg/dL (ref 6–23)
CO2: 25 meq/L (ref 19–32)
Calcium: 9.2 mg/dL (ref 8.4–10.5)
Chloride: 104 meq/L (ref 96–112)
Creatinine, Ser: 1.4 mg/dL — ABNORMAL HIGH (ref 0.40–1.20)
GFR: 34.92 mL/min — ABNORMAL LOW (ref >60.00)
Glucose, Bld: 111 mg/dL — ABNORMAL HIGH (ref 70–99)
Potassium: 3.5 meq/L (ref 3.5–5.1)
Sodium: 138 meq/L (ref 135–145)

## 2024-10-01 LAB — T4, FREE: Free T4: 1.27 ng/dL (ref 0.60–1.60)

## 2024-10-01 LAB — TSH: TSH: 0.67 u[IU]/mL (ref 0.35–5.50)

## 2024-10-01 LAB — VITAMIN D 25 HYDROXY (VIT D DEFICIENCY, FRACTURES): VITD: 50.95 ng/mL (ref 30.00–100.00)

## 2024-10-06 ENCOUNTER — Ambulatory Visit

## 2024-10-07 ENCOUNTER — Other Ambulatory Visit

## 2024-10-07 DIAGNOSIS — D649 Anemia, unspecified: Secondary | ICD-10-CM

## 2024-10-07 DIAGNOSIS — R944 Abnormal results of kidney function studies: Secondary | ICD-10-CM

## 2024-10-07 LAB — COMPREHENSIVE METABOLIC PANEL WITH GFR
ALT: 12 U/L (ref 3–35)
AST: 22 U/L (ref 5–37)
Albumin: 3.9 g/dL (ref 3.5–5.2)
Alkaline Phosphatase: 62 U/L (ref 39–117)
BUN: 14 mg/dL (ref 6–23)
CO2: 26 meq/L (ref 19–32)
Calcium: 9 mg/dL (ref 8.4–10.5)
Chloride: 104 meq/L (ref 96–112)
Creatinine, Ser: 1.55 mg/dL — ABNORMAL HIGH (ref 0.40–1.20)
GFR: 30.9 mL/min — ABNORMAL LOW (ref >60.00)
Glucose, Bld: 160 mg/dL — ABNORMAL HIGH (ref 70–99)
Potassium: 3.4 meq/L — ABNORMAL LOW (ref 3.5–5.1)
Sodium: 137 meq/L (ref 135–145)
Total Bilirubin: 0.8 mg/dL (ref 0.2–1.2)
Total Protein: 6.2 g/dL (ref 6.0–8.3)

## 2024-10-07 LAB — MICROALBUMIN / CREATININE URINE RATIO
Creatinine,U: 123.7 mg/dL
Microalb Creat Ratio: 244.9 mg/g — ABNORMAL HIGH (ref 0.0–30.0)
Microalb, Ur: 30.3 mg/dL — ABNORMAL HIGH (ref 0.7–1.9)

## 2024-10-07 LAB — CBC
HCT: 30.2 % — ABNORMAL LOW (ref 36.0–46.0)
Hemoglobin: 10.2 g/dL — ABNORMAL LOW (ref 12.0–15.0)
MCHC: 33.8 g/dL (ref 30.0–36.0)
MCV: 95.4 fl (ref 78.0–100.0)
Platelets: 162 K/uL (ref 150.0–400.0)
RBC: 3.16 Mil/uL — ABNORMAL LOW (ref 3.87–5.11)
RDW: 14.3 % (ref 11.5–15.5)
WBC: 5.6 K/uL (ref 4.0–10.5)

## 2024-10-08 ENCOUNTER — Ambulatory Visit: Payer: Self-pay | Admitting: Student

## 2024-10-08 ENCOUNTER — Other Ambulatory Visit: Payer: Self-pay | Admitting: Cardiology

## 2024-10-08 DIAGNOSIS — N1832 Chronic kidney disease, stage 3b: Secondary | ICD-10-CM

## 2024-10-08 DIAGNOSIS — R809 Proteinuria, unspecified: Secondary | ICD-10-CM

## 2024-10-08 LAB — IRON,TIBC AND FERRITIN PANEL
%SAT: 16 % (ref 16–45)
Ferritin: 161 ng/mL (ref 16–288)
Iron: 48 ug/dL (ref 45–160)
TIBC: 292 ug/dL (ref 250–450)

## 2024-10-28 ENCOUNTER — Encounter: Payer: Self-pay | Admitting: Cardiology

## 2024-10-28 ENCOUNTER — Ambulatory Visit: Attending: Cardiology | Admitting: Cardiology

## 2024-10-28 VITALS — BP 124/76 | HR 73 | Ht 60.6 in | Wt 192.6 lb

## 2024-10-28 DIAGNOSIS — I10 Essential (primary) hypertension: Secondary | ICD-10-CM | POA: Diagnosis not present

## 2024-10-28 DIAGNOSIS — I2510 Atherosclerotic heart disease of native coronary artery without angina pectoris: Secondary | ICD-10-CM | POA: Diagnosis not present

## 2024-10-28 DIAGNOSIS — E782 Mixed hyperlipidemia: Secondary | ICD-10-CM | POA: Diagnosis not present

## 2024-10-28 DIAGNOSIS — I2119 ST elevation (STEMI) myocardial infarction involving other coronary artery of inferior wall: Secondary | ICD-10-CM | POA: Diagnosis not present

## 2024-10-28 NOTE — Patient Instructions (Signed)

## 2024-10-28 NOTE — Progress Notes (Signed)
 " Cardiology Office Note:    Date:  10/28/2024   ID:  Wendy Munoz, DOB 29-Mar-1941, MRN [id number]  PCP:  Domenica Harlene LABOR, MD  Cardiologist:  Jennifer JONELLE Crape, MD   Referring MD: Domenica Harlene LABOR, MD    ASSESSMENT:    1. Mixed hyperlipidemia   2. ST elevation myocardial infarction (STEMI) of inferior wall (HCC)   3. Coronary artery disease involving native coronary artery of native heart without angina pectoris   4. Morbid obesity (HCC)   5. Essential hypertension    PLAN:    In order of problems listed above:  Coronary artery disease: Secondary prevention stressed with the patient.  Importance of compliance with diet medication stressed and patient verbalized standing.  She was advised to ambulate to the best of her ability. Essential hypertension: Blood pressure is stable and diet was emphasized. Paroxysmal atrial fibrillation:I discussed with the patient atrial fibrillation, disease process. Management and therapy including rate and rhythm control, anticoagulation benefits and potential risks were discussed extensively with the patient. Patient had multiple questions which were answered to patient's satisfaction. Mixed dyslipidemia: On lipid-lowering medications followed by primary care.  Lipids are at goal. Obesity: Weight reduction stressed and he promises to do better.  Risks of obesity explained. Patient will be seen in follow-up appointment in 6 months or earlier if the patient has any concerns.    Medication Adjustments/Labs and Tests Ordered: Current medicines are reviewed at length with the patient today.  Concerns regarding medicines are outlined above.  Orders Placed This Encounter  Procedures   EKG 12-Lead   No orders of the defined types were placed in this encounter.    No chief complaint on file.    History of Present Illness:    Wendy Munoz is a 84 y.o. female.  Patient has past medical history of paroxysmal atrial fibrillation, essential  hypertension, mixed dyslipidemia hypertension mellitus and coronary artery disease.  She leads a sedentary lifestyle because of orthopedic issues.  She has had a fall many months ago but this was a mechanical fall subsequently she has not had no issues.  She is stable on her feet she tells me.  She is on anticoagulation.  At the time of my evaluation, the patient is alert awake oriented and in no distress.   Past Medical History:  Diagnosis Date   Allergy 06/26/2017   IMO SNOMED Dx Update Oct 2024     Anemia 05/21/2017   Arthritis    Atrial tachycardia    a. asymptomatic. Noted on tele during 11/2014 admission    Bilateral sacral insufficiency fracture 06/29/2021   CAD (coronary artery disease)    a. 11/2014 inf STEMI s/p DES to RCA; 50-70% ruptured plaque in pLAD- rx medically   Controlled type 2 diabetes mellitus with complication, without long-term current use of insulin (HCC) 08/10/2023   GERD (gastroesophageal reflux disease)    Hematuria 02/18/2017   Hemorrhoids 05/11/2019   HTN (hypertension)    Hyperlipidemia    Hypothyroidism    Insomnia 05/27/2017   Ischemic cardiomyopathy    a. 11/2014  LV gram with inferior hypokinesis. EF 45-50%.   Macrocytic anemia 05/21/2017   Morbid obesity (HCC) 11/02/2023   Nocturia 01/08/2017   Obesity 06/26/2017   On apixaban  therapy 02/12/2019   Osteoporosis    Other chronic pain 09/26/2024   PAF (paroxysmal atrial fibrillation) (HCC)    a. isolated episode of atrial fibrillation several years ago associated w/ her thyroid  dysfunction (prior to  ablation).     Preventative health care 06/26/2017   Rosacea 06/15/2021   S/P knee replacement 04/03/2013   Urinary frequency 05/27/2017   Vasomotor rhinitis 06/26/2017    Past Surgical History:  Procedure Laterality Date   BREAST SURGERY     breast biospy   CATARACT EXTRACTION Bilateral    COLONOSCOPY     EYE SURGERY Bilateral 2005, 2006   Cataract with lens ImplaNT    INGUINAL HERNIA REPAIR  Right    LEFT HEART CATHETERIZATION WITH CORONARY ANGIOGRAM N/A 12/02/2014   Procedure: LEFT HEART CATHETERIZATION WITH CORONARY ANGIOGRAM;  Surgeon: Victory LELON Claudene DOUGLAS, MD;  Location: [facility] CATH LAB;  Service: Cardiovascular;  Laterality: N/A;   ORIF HUMERUS FRACTURE Right 01/27/2014   Procedure: RIGHT OPEN REDUCTION INTERNAL FIXATION (ORIF) PROXIMAL HUMERUS FRACTURE;  Surgeon: Ozell VEAR Bruch, MD;  Location: [facility] OR;  Service: Orthopedics;  Laterality: Right;   TOTAL KNEE ARTHROPLASTY Right 2006   TOTAL KNEE ARTHROPLASTY Left 2011   UMBILICAL HERNIA REPAIR     as a baby    Current Medications: Active Medications[1]   Allergies:   Keflex [cephalexin]   Social History   Socioeconomic History   Marital status: Married    Spouse name: Not on file   Number of children: Not on file   Years of education: Not on file   Highest education level: 12th grade  Occupational History   Occupation: retired   Tobacco Use   Smoking status: Never   Smokeless tobacco: Never  Vaping Use   Vaping status: Never Used  Substance and Sexual Activity   Alcohol  use: No    Alcohol /week: 0.0 standard drinks of alcohol    Drug use: No   Sexual activity: Yes    Partners: Male  Other Topics Concern   Not on file  Social History Narrative   Lives with husband   No pets   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    One daughter   Social Drivers of Health   Tobacco Use: Low Risk (10/28/2024)   Patient History    Smoking Tobacco Use: Never    Smokeless Tobacco Use: Never    Passive Exposure: Not on file  Financial Resource Strain: Low Risk (09/25/2024)   Overall Financial Resource Strain (CARDIA)    Difficulty of Paying Living Expenses: Not hard at all  Food Insecurity: No Food Insecurity (09/25/2024)   Epic    Worried About Radiation Protection Practitioner of Food in the Last Year: Never true    Ran Out of Food in the Last Year: Never true  Transportation Needs: No Transportation Needs (09/25/2024)   Epic    Lack  of Transportation (Medical): No    Lack of Transportation (Non-Medical): No  Physical Activity: Inactive (09/25/2024)   Exercise Vital Sign    Days of Exercise per Week: 0 days    Minutes of Exercise per Session: Not on file  Stress: No Stress Concern Present (09/25/2024)   [employer] of Occupational Health - Occupational Stress Questionnaire    Feeling of Stress: Not at all  Social Connections: Unknown (09/25/2024)   Social Connection and Isolation Panel    Frequency of Communication with Friends and Family: Twice a week    Frequency of Social Gatherings with Friends and Family: Patient declined    Attends Religious Services: More than 4 times per year    Active Member of [employer] or Organizations: No    Attends Banker Meetings: Not on file  Marital Status: Married  Depression (PHQ2-9): Low Risk (09/06/2023)   Depression (PHQ2-9)    PHQ-2 Score: 0  Alcohol  Screen: Not on file  Housing: Low Risk (09/25/2024)   Epic    Unable to Pay for Housing in the Last Year: No    Number of Times Moved in the Last Year: 0    Homeless in the Last Year: No  Utilities: Not At Risk (09/04/2023)   [employer] Utilities    Threatened with loss of utilities: No  Health Literacy: Adequate Health Literacy (09/06/2023)   B1300 Health Literacy    Frequency of need for help with medical instructions: Never     Family History: The patient's family history includes Arthritis in her maternal aunt; Atrial fibrillation in her daughter; Breast cancer in her maternal aunt and another family member; Breast cancer (age of onset: 88) in her sister; CAD in her mother; Cancer in her sister; Diabetes in her paternal grandfather; Heart disease in her brother and mother; Heart failure in her mother; Hyperlipidemia in her sister; Hypertension in her brother and sister; Lung cancer in her paternal grandmother; Multiple myeloma in her father; Neuropathy in her sister; Polycythemia in her father; Prostate cancer in  her paternal grandfather; Stroke in her sister.  ROS:   Please see the history of present illness.    All other systems reviewed and are negative.  EKGs/Labs/Other Studies Reviewed:    The following studies were reviewed today: .SABRAEKG Interpretation Date/Time:  Tuesday October 28 2024 13:59:54 EST Ventricular Rate:  73 PR Interval:  142 QRS Duration:  66 QT Interval:  402 QTC Calculation: 442 R Axis:   59  Text Interpretation: Normal sinus rhythm Inferior infarct , age undetermined Cannot rule out Anterior infarct , age undetermined Abnormal ECG When compared with ECG of 15-Apr-2015 10:47, PREVIOUS ECG IS PRESENT Confirmed by Edwyna Backers [phone]) on 10/28/2024 2:13:32 PM     Recent Labs: 09/30/2024: TSH 0.67 10/07/2024: ALT 12; BUN 14; Creatinine, Ser 1.55; Hemoglobin 10.2; Platelets 162.0; Potassium 3.4; Sodium 137  Recent Lipid Panel    Component Value Date/Time   CHOL 181 09/30/2024 1552   TRIG 88 09/30/2024 1552   HDL 99 09/30/2024 1552   CHOLHDL 1.8 09/30/2024 1552   VLDL 16.8 08/06/2024 1305   LDLCALC 65 09/30/2024 1552    Physical Exam:    VS:  BP 124/76   Pulse 73   Ht 5' 0.6 (1.539 m)   Wt 192 lb 9.6 oz (87.4 kg)   SpO2 95%   BMI 36.87 kg/m     Wt Readings from Last 3 Encounters:  10/28/24 192 lb 9.6 oz (87.4 kg)  09/30/24 200 lb (90.7 kg)  07/15/24 200 lb (90.7 kg)     GEN: Patient is in no acute distress HEENT: Normal NECK: No JVD; No carotid bruits LYMPHATICS: No lymphadenopathy CARDIAC: Hear sounds regular, 2/6 systolic murmur at the apex. RESPIRATORY:  Clear to auscultation without rales, wheezing or rhonchi  ABDOMEN: Soft, non-tender, non-distended MUSCULOSKELETAL:  No edema; No deformity  SKIN: Warm and dry NEUROLOGIC:  Alert and oriented x 3 PSYCHIATRIC:  Normal affect   Signed, Backers JONELLE Edwyna, MD  10/28/2024 2:45 PM    [facility]     [1]  Current Meds  Medication Sig   alendronate  (FOSAMAX ) 70 MG  tablet Take 1 tablet (70 mg total) by mouth every 7 (seven) days. TAKE 1 TABLET BY MOUTH EVERY 7 DAYS.   Calcium -Magnesium-Vitamin D  (CALCIUM  MAGNESIUM PO)  Take 1 capsule by mouth daily.   cetirizine (ZYRTEC) 10 MG tablet Take 10 mg by mouth at bedtime.   Cholecalciferol (D3 5000 PO) Take 5,000 Units by mouth daily.   Coenzyme Q10 200 MG TABS Take 200 mg by mouth daily.   ELIQUIS  5 MG TABS tablet TAKE 1 TABLET BY MOUTH TWICE A DAY   levothyroxine  (SYNTHROID ) 88 MCG tablet TAKE 1 TABLET BY MOUTH EVERY DAY BEFORE BREAKFAST SKIP THE SATURDAY DOSE   lisinopril  (ZESTRIL ) 2.5 MG tablet Take 1 tablet (2.5 mg total) by mouth daily.   metoprolol  tartrate (LOPRESSOR ) 25 MG tablet Take 0.5 tablets (12.5 mg total) by mouth 2 (two) times daily.   nitroGLYCERIN  (NITROSTAT ) 0.4 MG SL tablet Place 1 tablet (0.4 mg total) under the tongue every 5 (five) minutes as needed for chest pain.   rosuvastatin  (CRESTOR ) 40 MG tablet TAKE 1 TABLET BY MOUTH EVERY DAY   "

## 2024-10-31 ENCOUNTER — Other Ambulatory Visit: Payer: Self-pay | Admitting: Cardiology

## 2024-11-22 ENCOUNTER — Other Ambulatory Visit: Payer: Self-pay | Admitting: Family Medicine

## 2025-03-31 ENCOUNTER — Other Ambulatory Visit

## 2025-03-31 ENCOUNTER — Ambulatory Visit: Admitting: Student
